# Patient Record
Sex: Female | Born: 1945 | Race: White | Hispanic: No | Smoking: Former smoker
Health system: Southern US, Community
[De-identification: ages and names within clinical notes are randomized; demographics above are authoritative.]

## PROBLEM LIST (undated history)

## (undated) DIAGNOSIS — L409 Psoriasis, unspecified: Secondary | ICD-10-CM

## (undated) DIAGNOSIS — I480 Paroxysmal atrial fibrillation: Secondary | ICD-10-CM

## (undated) DIAGNOSIS — I10 Essential (primary) hypertension: Secondary | ICD-10-CM

## (undated) DIAGNOSIS — M1990 Unspecified osteoarthritis, unspecified site: Secondary | ICD-10-CM

## (undated) DIAGNOSIS — D510 Vitamin B12 deficiency anemia due to intrinsic factor deficiency: Secondary | ICD-10-CM

## (undated) DIAGNOSIS — E785 Hyperlipidemia, unspecified: Secondary | ICD-10-CM

## (undated) DIAGNOSIS — J45909 Unspecified asthma, uncomplicated: Secondary | ICD-10-CM

## (undated) DIAGNOSIS — M7501 Adhesive capsulitis of right shoulder: Secondary | ICD-10-CM

## (undated) DIAGNOSIS — D509 Iron deficiency anemia, unspecified: Secondary | ICD-10-CM

## (undated) HISTORY — DX: Unspecified osteoarthritis, unspecified site: M19.90

## (undated) HISTORY — PX: CHOLECYSTECTOMY: SHX55

## (undated) HISTORY — DX: Iron deficiency anemia, unspecified: D50.9

## (undated) HISTORY — DX: Vitamin B12 deficiency anemia due to intrinsic factor deficiency: D51.0

## (undated) HISTORY — DX: Adhesive capsulitis of right shoulder: M75.01

## (undated) HISTORY — DX: Paroxysmal atrial fibrillation: I48.0

## (undated) HISTORY — DX: Unspecified asthma, uncomplicated: J45.909

## (undated) HISTORY — DX: Essential (primary) hypertension: I10

## (undated) HISTORY — DX: Psoriasis, unspecified: L40.9

## (undated) HISTORY — DX: Hyperlipidemia, unspecified: E78.5

## (undated) HISTORY — PX: HIP ARTHROPLASTY: SHX981

## (undated) HISTORY — PX: KNEE ARTHROPLASTY: SHX992

---

## 2013-07-09 DIAGNOSIS — D509 Iron deficiency anemia, unspecified: Secondary | ICD-10-CM | POA: Insufficient documentation

## 2014-03-02 DIAGNOSIS — S7002XA Contusion of left hip, initial encounter: Secondary | ICD-10-CM | POA: Insufficient documentation

## 2014-09-23 DIAGNOSIS — D72819 Decreased white blood cell count, unspecified: Secondary | ICD-10-CM

## 2014-09-23 DIAGNOSIS — L409 Psoriasis, unspecified: Secondary | ICD-10-CM | POA: Insufficient documentation

## 2014-09-23 DIAGNOSIS — M169 Osteoarthritis of hip, unspecified: Secondary | ICD-10-CM | POA: Insufficient documentation

## 2014-09-23 DIAGNOSIS — J45909 Unspecified asthma, uncomplicated: Secondary | ICD-10-CM | POA: Insufficient documentation

## 2014-09-23 DIAGNOSIS — I10 Essential (primary) hypertension: Secondary | ICD-10-CM | POA: Insufficient documentation

## 2014-09-23 DIAGNOSIS — K219 Gastro-esophageal reflux disease without esophagitis: Secondary | ICD-10-CM | POA: Insufficient documentation

## 2014-09-23 DIAGNOSIS — D510 Vitamin B12 deficiency anemia due to intrinsic factor deficiency: Secondary | ICD-10-CM | POA: Insufficient documentation

## 2014-09-23 HISTORY — DX: Decreased white blood cell count, unspecified: D72.819

## 2014-09-23 HISTORY — DX: Osteoarthritis of hip, unspecified: M16.9

## 2016-12-18 DIAGNOSIS — R011 Cardiac murmur, unspecified: Secondary | ICD-10-CM | POA: Insufficient documentation

## 2019-01-08 DIAGNOSIS — I480 Paroxysmal atrial fibrillation: Secondary | ICD-10-CM | POA: Insufficient documentation

## 2019-07-28 DIAGNOSIS — Z96641 Presence of right artificial hip joint: Secondary | ICD-10-CM | POA: Insufficient documentation

## 2019-10-02 DIAGNOSIS — Z7901 Long term (current) use of anticoagulants: Secondary | ICD-10-CM | POA: Insufficient documentation

## 2020-07-05 DIAGNOSIS — E782 Mixed hyperlipidemia: Secondary | ICD-10-CM | POA: Insufficient documentation

## 2020-10-26 DIAGNOSIS — M7501 Adhesive capsulitis of right shoulder: Secondary | ICD-10-CM | POA: Insufficient documentation

## 2021-05-25 ENCOUNTER — Other Ambulatory Visit: Payer: Self-pay

## 2021-05-25 ENCOUNTER — Encounter: Payer: Self-pay | Admitting: Primary Care

## 2021-05-25 ENCOUNTER — Ambulatory Visit (INDEPENDENT_AMBULATORY_CARE_PROVIDER_SITE_OTHER): Payer: Medicare Other | Admitting: Primary Care

## 2021-05-25 VITALS — BP 124/80 | HR 55 | Temp 98.3°F | Ht 63.0 in | Wt 205.0 lb

## 2021-05-25 DIAGNOSIS — J4520 Mild intermittent asthma, uncomplicated: Secondary | ICD-10-CM

## 2021-05-25 DIAGNOSIS — R739 Hyperglycemia, unspecified: Secondary | ICD-10-CM

## 2021-05-25 DIAGNOSIS — I480 Paroxysmal atrial fibrillation: Secondary | ICD-10-CM | POA: Diagnosis not present

## 2021-05-25 DIAGNOSIS — Z23 Encounter for immunization: Secondary | ICD-10-CM | POA: Diagnosis not present

## 2021-05-25 DIAGNOSIS — L409 Psoriasis, unspecified: Secondary | ICD-10-CM

## 2021-05-25 DIAGNOSIS — I10 Essential (primary) hypertension: Secondary | ICD-10-CM

## 2021-05-25 DIAGNOSIS — D510 Vitamin B12 deficiency anemia due to intrinsic factor deficiency: Secondary | ICD-10-CM

## 2021-05-25 DIAGNOSIS — M7501 Adhesive capsulitis of right shoulder: Secondary | ICD-10-CM

## 2021-05-25 DIAGNOSIS — E782 Mixed hyperlipidemia: Secondary | ICD-10-CM | POA: Diagnosis not present

## 2021-05-25 DIAGNOSIS — D509 Iron deficiency anemia, unspecified: Secondary | ICD-10-CM

## 2021-05-25 DIAGNOSIS — K219 Gastro-esophageal reflux disease without esophagitis: Secondary | ICD-10-CM

## 2021-05-25 DIAGNOSIS — Z7901 Long term (current) use of anticoagulants: Secondary | ICD-10-CM

## 2021-05-25 LAB — CBC
HCT: 38.9 % (ref 36.0–46.0)
Hemoglobin: 12.7 g/dL (ref 12.0–15.0)
MCHC: 32.7 g/dL (ref 30.0–36.0)
MCV: 97.1 fl (ref 78.0–100.0)
Platelets: 160 10*3/uL (ref 150.0–400.0)
RBC: 4.01 Mil/uL (ref 3.87–5.11)
RDW: 13.2 % (ref 11.5–15.5)
WBC: 4.3 10*3/uL (ref 4.0–10.5)

## 2021-05-25 LAB — LIPID PANEL
Cholesterol: 154 mg/dL (ref 0–200)
HDL: 60.6 mg/dL (ref >39.00)
LDL Cholesterol: 66 mg/dL (ref 0–99)
NonHDL: 92.93
Total CHOL/HDL Ratio: 3
Triglycerides: 133 mg/dL (ref 0.0–149.0)
VLDL: 26.6 mg/dL (ref 0.0–40.0)

## 2021-05-25 LAB — COMPREHENSIVE METABOLIC PANEL
ALT: 20 U/L (ref 0–35)
AST: 21 U/L (ref 0–37)
Albumin: 4.3 g/dL (ref 3.5–5.2)
Alkaline Phosphatase: 104 U/L (ref 39–117)
BUN: 38 mg/dL — ABNORMAL HIGH (ref 6–23)
CO2: 23 mEq/L (ref 19–32)
Calcium: 9.6 mg/dL (ref 8.4–10.5)
Chloride: 110 mEq/L (ref 96–112)
Creatinine, Ser: 1.71 mg/dL — ABNORMAL HIGH (ref 0.40–1.20)
GFR: 29.31 mL/min — ABNORMAL LOW (ref >60.00)
Glucose, Bld: 89 mg/dL (ref 70–99)
Potassium: 5.3 mEq/L — ABNORMAL HIGH (ref 3.5–5.1)
Sodium: 142 mEq/L (ref 135–145)
Total Bilirubin: 0.8 mg/dL (ref 0.2–1.2)
Total Protein: 6.7 g/dL (ref 6.0–8.3)

## 2021-05-25 LAB — IBC + FERRITIN
Ferritin: 34.5 ng/mL (ref 10.0–291.0)
Iron: 101 ug/dL (ref 42–145)
Saturation Ratios: 27.7 % (ref 20.0–50.0)
TIBC: 364 ug/dL (ref 250.0–450.0)
Transferrin: 260 mg/dL (ref 212.0–360.0)

## 2021-05-25 LAB — HEMOGLOBIN A1C: Hgb A1c MFr Bld: 5 % (ref 4.6–6.5)

## 2021-05-25 LAB — VITAMIN B12: Vitamin B-12: >1550 pg/mL — ABNORMAL HIGH (ref 211–911)

## 2021-05-25 MED ORDER — PREDNISONE 20 MG PO TABS: ORAL_TABLET | ORAL | 0 refills | Status: DC

## 2021-05-25 NOTE — Assessment & Plan Note (Signed)
Compliant to Xarelto 20 mg. Continue same.

## 2021-05-25 NOTE — Assessment & Plan Note (Signed)
Obvious today on exam. Rx for prednisone course sent to pharmacy. She will set up with our sports medicine physician.

## 2021-05-25 NOTE — Patient Instructions (Signed)
Stop by the lab prior to leaving today. I will notify you of your results once received.   You will be contacted regarding your referral to cardiology and dermatology.  Please let us know if you have not been contacted within two weeks.   Start prednisone tablets for your shoulder pain. Take two tablets my mouth once daily for four days, then one tablet once daily for four days.   Schedule a visit with Dr. Lorelei Pont for your shoulders.   It was a pleasure to meet you today! Please don't hesitate to contact me with any questions. Welcome to [facility]!

## 2021-05-25 NOTE — Assessment & Plan Note (Signed)
Off of oral iron x 6 months. Repeat iron studies and CBC pending.

## 2021-05-25 NOTE — Progress Notes (Signed)
Subjective:    Patient ID: Amy Underwood, female    DOB: 03-25-1946, 75 y.o.   MRN: [id number]  HPI  Amy Underwood is a very pleasant 75 y.o. female who presents today who presents today to establish care and discuss the problems mentioned below. Will obtain/review records.  1) Essential Hypertension/Paroxysmal Atrial Fibrillation: Currently managed on diltiazem CD 240 mg, Tikosyn 125 mcg, furosemide 20 mg, losartan 100 mg, Xarelto 20 mg, spironolactone 50 mg.   BP Readings from Last 3 Encounters:  05/25/21 124/80    Following with cardiology in [place], needs someone locally. She denies chest pain and palpitations. She can feel when she is in atrial fibrillation, no recent episodes. Feels well managed on her current regimen.   2) Psoriasis: Currently managed on Enbrel injections, Dovonox 0.005% cream, clobetasol 0.05% cream. Following with dermatology in [place]. Needs to find someone locally.   3) GERD: Currently managed on pantoprazole 40 mg daily, feels well managed on this regimen. No breakthrough symptoms unless she encounters a food trigger.   4) Asthma: Diagnosed as a child. Managed on albuterol inhaler PRN, uses infrequently.   5) Iron Deficiency Anemia/Pernicious: Chronic for 10 years, history of iron infusions, has been on oral iron for years. Stopped taking this about 6 months ago. She denies fatigue, palpitations.   She is compliant to vitamin B12 1000 mcg daily.   6) Chronic Shoulder Pain: Chronic, worse on right side compared to left. Evaluated by orthopedics in [place], underwent steroid injections in January 2022 or February 2022 with resolve in symptoms until about three weeks ago. Right shoulder pain with radiation down right upper extremity and numbness.     Review of Systems  Constitutional:  Negative for unexpected weight change.  HENT:  Negative for congestion.   Respiratory:  Negative for shortness of breath and wheezing.   Cardiovascular:  Negative  for chest pain and palpitations.  Gastrointestinal:  Negative for nausea.       Denies GERD symptoms  Musculoskeletal:  Positive for arthralgias.  Skin:  Negative for color change.  Allergic/Immunologic: Negative for environmental allergies.  Neurological:  Positive for numbness.  Hematological:  Negative for adenopathy.        Past Medical History:  Diagnosis Date   Adhesive capsulitis of right shoulder    Asthma    Hyperlipidemia    Hypertension    Iron deficiency anemia    Osteoarthritis    Paroxysmal atrial fibrillation (HCC)    Pernicious anemia    Psoriasis     Social History   Socioeconomic History   Marital status: Widowed    Spouse name: Not on file   Number of children: Not on file   Years of education: Not on file   Highest education level: Not on file  Occupational History   Not on file  Tobacco Use   Smoking status: Not on file   Smokeless tobacco: Not on file  Vaping Use   Vaping Use: Never used  Substance and Sexual Activity   Alcohol use: Not on file   Drug use: Never   Sexual activity: Not Currently  Other Topics Concern   Not on file  Social History Narrative   Not on file   Social Determinants of Health   Financial Resource Strain: Not on file  Food Insecurity: Not on file  Transportation Needs: Not on file  Physical Activity: Not on file  Stress: Not on file  Social Connections: Not on file  Intimate Partner Violence: Not  on file    History reviewed. No pertinent surgical history.  History reviewed. No pertinent family history.  Allergies  Allergen Reactions   Cefaclor Hives and Rash    Current Outpatient Medications on File Prior to Visit  Medication Sig Dispense Refill   albuterol (VENTOLIN HFA) 108 (90 Base) MCG/ACT inhaler ProAir HFA 90 mcg/actuation aerosol inhaler     atorvastatin (LIPITOR) 20 MG tablet atorvastatin 20 mg tablet  TAKE 1 TABLET BY MOUTH  DAILY     calcipotriene (DOVONOX) 0.005 % cream calcipotriene  0.005 % topical cream     Cholecalciferol 1.25 MG (50000 UT) capsule cholecalciferol (vitamin D3) 1,250 mcg (50,000 unit) capsule  Take 1 capsule every week by oral route.     clobetasol cream (TEMOVATE) 0.05 % clobetasol 0.05 % topical cream  APPLY TO AFFECTED AREA OF BODY TWICE A  DAY FOR 2 WEEKS OUT OF THE MONTH AS NEEDED FOR FLARES EXCLUDE FACE AND GENITAL     cyanocobalamin 1000 MCG tablet Vitamin B-12  1,000 mcg tablet  Take 1 tablet every day by oral route.     diltiazem (CARDIZEM CD) 240 MG 24 hr capsule Take 240 mg by mouth daily.     dofetilide (TIKOSYN) 125 MCG capsule dofetilide 125 mcg capsule     etanercept (ENBREL SURECLICK) 50 MG/ML injection Enbrel SureClick 50 mg/mL (1 mL) subcutaneous pen injector  Inject 2 mL every week by subcutaneous route.     ferrous sulfate 325 (65 FE) MG tablet ferrous sulfate 325 mg (65 mg iron) tablet  Take 1 tablet every day by oral route.     furosemide (LASIX) 20 MG tablet Take by mouth.     losartan (COZAAR) 100 MG tablet losartan 100 mg tablet     magnesium oxide (MAG-OX) 400 MG tablet magnesium 400 mg (as magnesium oxide) tablet  Take 1 tablet twice a day by oral route.     Multiple Vitamin (MULTI-VITAMIN) tablet Take 1 tablet by mouth every morning.     niacin 500 MG CR capsule niacin  500 mg daily     pantoprazole (PROTONIX) 40 MG tablet pantoprazole 40 mg tablet,delayed release     rivaroxaban (XARELTO) 20 MG TABS tablet Xarelto 20 mg tablet     spironolactone (ALDACTONE) 50 MG tablet Take 50 mg by mouth daily.     No current facility-administered medications on file prior to visit.    BP 124/80 (BP Location: Left Arm, Patient Position: Sitting, Cuff Size: Normal)   Pulse (!) 55   Temp 98.3 F (36.8 C) (Temporal)   Ht '5\' 3"'$  (1.6 m)   Wt 205 lb (93 kg)   SpO2 98%   BMI 36.31 kg/m  Objective:   Physical Exam HENT:     Head: Normocephalic.  Cardiovascular:     Rate and Rhythm: Normal rate and regular rhythm.  Pulmonary:      Effort: Pulmonary effort is normal.     Breath sounds: Normal breath sounds.  Musculoskeletal:     Right shoulder: Decreased range of motion.     Left shoulder: Decreased range of motion.     Cervical back: Neck supple.     Comments: Decrease in ROM to bilateral shoulders, right worse than left.   Skin:    General: Skin is warm and dry.  Neurological:     Mental Status: She is alert and oriented to person, place, and time.  Psychiatric:        Mood and Affect: Mood normal.  Assessment & Plan:      This visit occurred during the SARS-CoV-2 public health emergency.  Safety protocols were in place, including screening questions prior to the visit, additional usage of staff PPE, and extensive cleaning of exam room while observing appropriate contact time as indicated for disinfecting solutions.

## 2021-05-25 NOTE — Assessment & Plan Note (Signed)
Well controlled in the office today.  Continue losartan 100 mg, spironolactone 50 mg, furosemide 20 mg.  CMP pending.

## 2021-05-25 NOTE — Assessment & Plan Note (Signed)
Compliant to atorvastatin 20 mg. Continue same.  Repeat lipid panel pending.

## 2021-05-25 NOTE — Assessment & Plan Note (Signed)
Compliant to oral B12 1000 mcg. Repeat B12 level, iron studies and CBC pending.

## 2021-05-25 NOTE — Assessment & Plan Note (Signed)
Controlled, continue pantoprazole 40 mg.

## 2021-05-25 NOTE — Assessment & Plan Note (Signed)
Controlled on Enbrel injections, clobetasol 0.05%, and Dovonox 0.005% cream.  Referral placed to dermatology

## 2021-05-25 NOTE — Assessment & Plan Note (Signed)
Well controlled in the office today. Continue PRN albuterol.

## 2021-05-25 NOTE — Assessment & Plan Note (Signed)
Rate and rhythm regular today.  Continue Tikosyn 125 mcg, diltiazem CD 240 mg, furosemide 20 mg, losartan 100 mg, spironolactone 50 mg, Xarelto 20 mg.  Referral placed to cardiology.

## 2021-05-26 NOTE — Telephone Encounter (Signed)
Response from results notes

## 2021-05-26 NOTE — Telephone Encounter (Signed)
See my message to her.  Needs lab appointment 1 week after she stops her spironolactone medicine.  [place].

## 2021-05-30 NOTE — Telephone Encounter (Signed)
Left message to return call to our office.  

## 2021-05-30 NOTE — Telephone Encounter (Signed)
Patient called office appointment has been made. No further action at this time.

## 2021-05-31 ENCOUNTER — Telehealth: Payer: Self-pay | Admitting: Primary Care

## 2021-05-31 DIAGNOSIS — I10 Essential (primary) hypertension: Secondary | ICD-10-CM

## 2021-05-31 NOTE — Telephone Encounter (Signed)
Pt has a lab appt 9/29, there is no orders in yet.

## 2021-05-31 NOTE — Addendum Note (Signed)
Addended by: Pleas Koch on: 05/31/2021 01:28 PM   Modules accepted: Orders

## 2021-05-31 NOTE — Telephone Encounter (Signed)
Lab orders placed.  

## 2021-06-02 ENCOUNTER — Other Ambulatory Visit (INDEPENDENT_AMBULATORY_CARE_PROVIDER_SITE_OTHER): Payer: Medicare Other

## 2021-06-02 ENCOUNTER — Other Ambulatory Visit: Payer: Self-pay

## 2021-06-02 DIAGNOSIS — I10 Essential (primary) hypertension: Secondary | ICD-10-CM

## 2021-06-03 DIAGNOSIS — N184 Chronic kidney disease, stage 4 (severe): Secondary | ICD-10-CM

## 2021-06-03 LAB — BASIC METABOLIC PANEL
BUN: 45 mg/dL — ABNORMAL HIGH (ref 6–23)
CO2: 22 mEq/L (ref 19–32)
Calcium: 9.7 mg/dL (ref 8.4–10.5)
Chloride: 105 mEq/L (ref 96–112)
Creatinine, Ser: 1.84 mg/dL — ABNORMAL HIGH (ref 0.40–1.20)
GFR: 26.84 mL/min — ABNORMAL LOW (ref >60.00)
Glucose, Bld: 191 mg/dL — ABNORMAL HIGH (ref 70–99)
Potassium: 5.2 mEq/L — ABNORMAL HIGH (ref 3.5–5.1)
Sodium: 139 mEq/L (ref 135–145)

## 2021-06-16 ENCOUNTER — Other Ambulatory Visit: Payer: Self-pay

## 2021-06-16 ENCOUNTER — Ambulatory Visit
Admission: RE | Admit: 2021-06-16 | Discharge: 2021-06-16 | Disposition: A | Discharge status: Home / Self Care | Payer: Medicare Other | Source: Ambulatory Visit | Attending: Primary Care | Admitting: Primary Care

## 2021-06-16 DIAGNOSIS — N184 Chronic kidney disease, stage 4 (severe): Secondary | ICD-10-CM | POA: Insufficient documentation

## 2021-07-05 NOTE — Progress Notes (Signed)
Electrophysiology Office Note:    Date:  07/06/2021   ID:  Amy Underwood, DOB 03-28-1946, MRN [id number]  PCP:  Pleas Koch, NP  [facility] Cardiologist:  None  [facility] Electrophysiologist:  Vickie Epley, MD   Referring MD: Pleas Koch, NP   Chief Complaint: AF  History of Present Illness:    Amy Underwood is a 75 y.o. female who presents for an evaluation of AF at the request of Amy Friendly, NP. Their medical history includes HTN, pAF. She was last seen by Amy Underwood on 05/25/2021. She is maintained on xarelto for stroke prophylaxis. She has been seen previously by EP in [place], [place]). She was prescribed Dofetilide in the past.  Today she tells me she continues to have breakthrough episodes of atrial fibrillation despite treatment with dofetilide.  She continues to take Xarelto for stroke prophylaxis.  She is interested in achieving better rhythm control.  She has recently moved to the area from [place].  Her husband passed away earlier this year and she has moved to be closer to her son.    Past Medical History:  Diagnosis Date   Adhesive capsulitis of right shoulder    Asthma    Hyperlipidemia    Hypertension    Iron deficiency anemia    Osteoarthritis    Paroxysmal atrial fibrillation (HCC)    Pernicious anemia    Psoriasis     History reviewed. No pertinent surgical history.  Current Medications: Current Meds  Medication Sig   albuterol (VENTOLIN HFA) 108 (90 Base) MCG/ACT inhaler ProAir HFA 90 mcg/actuation aerosol inhaler   atorvastatin (LIPITOR) 20 MG tablet atorvastatin 20 mg tablet  TAKE 1 TABLET BY MOUTH  DAILY   calcipotriene (DOVONOX) 0.005 % cream calcipotriene 0.005 % topical cream   Cholecalciferol 1.25 MG (50000 UT) capsule cholecalciferol (vitamin D3) 1,250 mcg (50,000 unit) capsule  Take 1 capsule every week by oral route.   clobetasol cream (TEMOVATE) 0.05 % clobetasol 0.05 % topical cream  APPLY  TO AFFECTED AREA OF BODY TWICE A  DAY FOR 2 WEEKS OUT OF THE MONTH AS NEEDED FOR FLARES EXCLUDE FACE AND GENITAL   cyanocobalamin 1000 MCG tablet Vitamin B-12  1,000 mcg tablet  Take 1 tablet every day by oral route.   diltiazem (CARDIZEM CD) 240 MG 24 hr capsule Take 240 mg by mouth daily.   dofetilide (TIKOSYN) 125 MCG capsule dofetilide 125 mcg capsule   ferrous sulfate 325 (65 FE) MG tablet every 14 (fourteen) days.   furosemide (LASIX) 20 MG tablet Take by mouth as needed.   losartan (COZAAR) 100 MG tablet losartan 100 mg tablet   magnesium oxide (MAG-OX) 400 MG tablet magnesium 400 mg (as magnesium oxide) tablet  Take 1 tablet twice a day by oral route.   Multiple Vitamin (MULTI-VITAMIN) tablet Take 1 tablet by mouth every morning.   niacin 500 MG CR capsule niacin  500 mg daily   pantoprazole (PROTONIX) 40 MG tablet pantoprazole 40 mg tablet,delayed release   rivaroxaban (XARELTO) 20 MG TABS tablet Xarelto 20 mg tablet     Allergies:   Cefaclor   Social History   Socioeconomic History   Marital status: Widowed    Spouse name: Not on file   Number of children: Not on file   Years of education: Not on file   Highest education level: Not on file  Occupational History   Not on file  Tobacco Use   Smoking status: Former  Types: Cigarettes    Start date: 2001   Smokeless tobacco: Never  Vaping Use   Vaping Use: Never used  Substance and Sexual Activity   Alcohol use: Not on file   Drug use: Never   Sexual activity: Not Currently  Other Topics Concern   Not on file  Social History Narrative   Not on file   Social Determinants of Health   Financial Resource Strain: Not on file  Food Insecurity: Not on file  Transportation Needs: Not on file  Physical Activity: Not on file  Stress: Not on file  Social Connections: Not on file     Family History: The patient's family history is not on file.  ROS:   Please see the history of present illness.    All other  systems reviewed and are negative.  EKGs/Labs/Other Studies Reviewed:    The following studies were reviewed today: Prior notes from [facility]  September 06, 2020 echo [facility] report only) CONCLUSIONS:  1. Normal left ventricular cavity size. Normal left ventricular systolic  function. LV Ejection Fraction is approximately: 67 %.  2. Indeterminate diastolic dysfunction based on 2016 ASE guidelines.  3. There are no regional wall motion abnormalities.  4. Mild mitral valve regurgitation.  5. Mild aortic valve stenosis. The peak transaortic gradient is: 26.66 mmHg.  The mean transaortic gradient is: 11.61 mmHg. The aortic valve area by the  continuity equation (using VTI) is: 1.88 cm2.  6. The aortic root is normal in size. The ascending aorta is normal in size.    Blood work from Jan 28, 2021 shows a creatinine of 1.35, potassium of 4.8   EKG:  The ekg ordered today demonstrates sinus rhythm.  QTC is 461 ms.   Recent Labs: 05/25/2021: ALT 20; Hemoglobin 12.7; Platelets 160.0 06/02/2021: BUN 45; Creatinine, Ser 1.84; Potassium 5.2 No hemolysis seen; Sodium 139  Recent Lipid Panel    Component Value Date/Time   CHOL 154 05/25/2021 1047   TRIG 133.0 05/25/2021 1047   HDL 60.60 05/25/2021 1047   CHOLHDL 3 05/25/2021 1047   VLDL 26.6 05/25/2021 1047   LDLCALC 66 05/25/2021 1047    Physical Exam:    VS:  BP (!) 144/74 (BP Location: Left Arm, Patient Position: Sitting, Cuff Size: Large)   Pulse 61   Ht 5\' 3"  (1.6 m)   Wt 198 lb (89.8 kg)   SpO2 98%   BMI 35.07 kg/m     Wt Readings from Last 3 Encounters:  07/06/21 198 lb (89.8 kg)  05/25/21 205 lb (93 kg)     GEN:  Well nourished, well developed in no acute distress HEENT: Normal NECK: No JVD; No carotid bruits LYMPHATICS: No lymphadenopathy CARDIAC: RRR, no murmurs, rubs, gallops RESPIRATORY:  Clear to auscultation without rales, wheezing or rhonchi  ABDOMEN: Soft, non-tender, non-distended MUSCULOSKELETAL:  No  edema; No deformity  SKIN: Warm and dry NEUROLOGIC:  Alert and oriented x 3 PSYCHIATRIC:  Normal affect       ASSESSMENT:    1. Persistent atrial fibrillation ([place])   2. Encounter for long-term (current) use of high-risk medication    PLAN:    In order of problems listed above:  #Persistent atrial fibrillation Continues to have symptomatic atrial fibrillation despite treatment with Tikosyn 125 mcg by mouth twice daily.  Unfortunately we cannot increase the dose of dofetilide any further because of her renal dysfunction.  I discussed additional rhythm control strategies including alternative antiarrhythmic drugs (amiodarone) versus catheter ablation.  I  do think she is an acceptable candidate for catheter ablation.  I discussed the procedure in detail with the patient during today's visit include the risk, recovery and likelihood of success.  She think she would like to proceed with this option but she would like to talk it over with her family first.  If she decides to proceed with catheter ablation, she would need a CT and echocardiogram.  Would plan to continue Tikosyn around the time of the ablation and at least for the 3 months following the ablation procedure.  We will need to monitor her renal function closely given her GFR of 27 on the last check.  If there is any further decline in renal function, will likely need to discontinue the dofetilide.  Continue Xarelto for stroke prophylaxis.  Risk, benefits, and alternatives to EP study and radiofrequency ablation for afib were also discussed in detail today. These risks include but are not limited to stroke, bleeding, vascular damage, tamponade, perforation, damage to the esophagus, lungs, and other structures, pulmonary vein stenosis, worsening renal function, and death. The patient understands these risk and wishes to discuss with family before making a final decision.  Carto, ICE, anesthesia are requested for the procedure.  Will also  obtain CT PV protocol prior to the procedure to exclude LAA thrombus and further evaluate atrial anatomy.  #High risk medication use QTC is 461 ms on today's EKG.  Last GFR was 27.  Okay for dofetilide 125 mcg by mouth twice daily with close monitoring of renal function.    Total time spent with patient today 60 minutes. This includes reviewing records, evaluating the patient and coordinating care.  Medication Adjustments/Labs and Tests Ordered: Current medicines are reviewed at length with the patient today.  Concerns regarding medicines are outlined above.  Orders Placed This Encounter  Procedures   EKG 12-Lead    No orders of the defined types were placed in this encounter.    Signed, Hilton Cork. Quentin Ore, MD, [facility], [facility] 07/06/2021 3:44 PM    Electrophysiology [facility]

## 2021-07-06 ENCOUNTER — Encounter: Payer: Self-pay | Admitting: Cardiology

## 2021-07-06 ENCOUNTER — Ambulatory Visit (INDEPENDENT_AMBULATORY_CARE_PROVIDER_SITE_OTHER): Payer: Medicare Other | Admitting: Cardiology

## 2021-07-06 ENCOUNTER — Other Ambulatory Visit: Payer: Self-pay

## 2021-07-06 VITALS — BP 144/74 | HR 61 | Ht 63.0 in | Wt 198.0 lb

## 2021-07-06 DIAGNOSIS — I4819 Other persistent atrial fibrillation: Secondary | ICD-10-CM | POA: Diagnosis not present

## 2021-07-06 DIAGNOSIS — Z79899 Other long term (current) drug therapy: Secondary | ICD-10-CM

## 2021-07-06 NOTE — Patient Instructions (Addendum)
Medication Instructions:  Your physician recommends that you continue on your current medications as directed. Please refer to the Current Medication list given to you today. *If you need a refill on your cardiac medications before your next appointment, please call your pharmacy*  Lab Work: None ordered. If you have labs (blood work) drawn today and your tests are completely normal, you will receive your results only by: [place] (if you have MyChart) OR A paper copy in the mail If you have any lab test that is abnormal or we need to change your treatment, we will call you to review the results.  Testing/Procedures: None ordered.  Follow-Up: At [facility], you and your health needs are our priority.  As part of our continuing mission to provide you with exceptional heart care, we have created designated Provider Care Teams.  These Care Teams include your primary Cardiologist (physician) and Advanced Practice Providers (APPs -  Physician Assistants and Nurse Practitioners) who all work together to provide you with the care you need, when you need it.  Your physician wants you to follow-up in: 3 months with [facility]. You will receive a reminder letter in the mail two months in advance. If you don't receive a letter, please call our office to schedule the follow-up appointment.    Please contact me if you would like to schedule an atrial fibrillation ablation.  Sonia Baller RN  Cardiac Ablation Cardiac ablation is a procedure to destroy, or ablate, a small amount of heart tissue in very specific places. The heart has many electrical connections. Sometimes these connections are abnormal and can cause the heart to beat very fast or irregularly. Ablating some of the areas that cause problems can improve the heart's rhythm or return it to normal. Ablation may be done for people who: Have Wolff-Parkinson-White syndrome. Have fast heart rhythms (tachycardia). Have taken medicines for  an abnormal heart rhythm (arrhythmia) that were not effective or caused side effects. Have a high-risk heartbeat that may be life-threatening. During the procedure, a small incision is made in the neck or the groin, and a long, thin tube (catheter) is inserted into the incision and moved to the heart. Small devices (electrodes) on the tip of the catheter will send out electrical currents. A type of X-ray (fluoroscopy) will be used to help guide the catheter and to provide images of the heart. Tell a health care provider about: Any allergies you have. All medicines you are taking, including vitamins, herbs, eye drops, creams, and over-the-counter medicines. Any problems you or family members have had with anesthetic medicines. Any blood disorders you have. Any surgeries you have had. Any medical conditions you have, such as kidney failure. Whether you are pregnant or may be pregnant. What are the risks? Generally, this is a safe procedure. However, problems may occur, including: Infection. Bruising and bleeding at the catheter insertion site. Bleeding into the chest, especially into the sac that surrounds the heart. This is a serious complication. Stroke or blood clots. Damage to nearby structures or organs. Allergic reaction to medicines or dyes. Need for a permanent pacemaker if the normal electrical system is damaged. A pacemaker is a small computer that sends electrical signals to the heart and helps your heart beat normally. The procedure not being fully effective. This may not be recognized until months later. Repeat ablation procedures are sometimes done. What happens before the procedure? Medicines Ask your health care provider about: Changing or stopping your regular medicines. This is especially important  if you are taking diabetes medicines or blood thinners. Taking medicines such as aspirin and ibuprofen. These medicines can thin your blood. Do not take these medicines unless your  health care provider tells you to take them. Taking over-the-counter medicines, vitamins, herbs, and supplements. General instructions Follow instructions from your health care provider about eating or drinking restrictions. Plan to have someone take you home from the hospital or clinic. If you will be going home right after the procedure, plan to have someone with you for 24 hours. Ask your health care provider what steps will be taken to prevent infection. What happens during the procedure?  An IV will be inserted into one of your veins. You will be given a medicine to help you relax (sedative). The skin on your neck or groin will be numbed. An incision will be made in your neck or your groin. A needle will be inserted through the incision and into a large vein in your neck or groin. A catheter will be inserted into the needle and moved to your heart. Dye may be injected through the catheter to help your surgeon see the area of the heart that needs treatment. Electrical currents will be sent from the catheter to ablate heart tissue in desired areas. There are three types of energy that may be used to do this: Heat (radiofrequency energy). Laser energy. Extreme cold (cryoablation). When the tissue has been ablated, the catheter will be removed. Pressure will be held on the insertion area to prevent a lot of bleeding. A bandage (dressing) will be placed over the insertion area. The exact procedure may vary among health care providers and hospitals. What happens after the procedure? Your blood pressure, heart rate, breathing rate, and blood oxygen level will be monitored until you leave the hospital or clinic. Your insertion area will be monitored for bleeding. You will need to lie still for a few hours to ensure that you do not bleed from the insertion area. Do not drive for 24 hours or as long as told by your health care provider. Summary Cardiac ablation is a procedure to destroy, or  ablate, a small amount of heart tissue using an electrical current. This procedure can improve the heart rhythm or return it to normal. Tell your health care provider about any medical conditions you may have and all medicines you are taking to treat them. This is a safe procedure, but problems may occur. Problems may include infection, bruising, damage to nearby organs or structures, or allergic reactions to medicines. Follow your health care provider's instructions about eating and drinking before the procedure. You may also be told to change or stop some of your medicines. After the procedure, do not drive for 24 hours or as long as told by your health care provider. This information is not intended to replace advice given to you by your health care provider. Make sure you discuss any questions you have with your health care provider. Document Revised: 06/30/2019 Document Reviewed: 06/30/2019 Elsevier Patient Education  [place].

## 2021-07-11 DIAGNOSIS — D631 Anemia in chronic kidney disease: Secondary | ICD-10-CM | POA: Insufficient documentation

## 2021-07-11 DIAGNOSIS — I129 Hypertensive chronic kidney disease with stage 1 through stage 4 chronic kidney disease, or unspecified chronic kidney disease: Secondary | ICD-10-CM | POA: Insufficient documentation

## 2021-07-11 DIAGNOSIS — N184 Chronic kidney disease, stage 4 (severe): Secondary | ICD-10-CM | POA: Insufficient documentation

## 2021-07-11 DIAGNOSIS — E875 Hyperkalemia: Secondary | ICD-10-CM | POA: Insufficient documentation

## 2021-07-11 HISTORY — DX: Hypertensive chronic kidney disease with stage 1 through stage 4 chronic kidney disease, or unspecified chronic kidney disease: I12.9

## 2021-07-18 DIAGNOSIS — M6530 Trigger finger, unspecified finger: Secondary | ICD-10-CM | POA: Insufficient documentation

## 2021-08-16 ENCOUNTER — Encounter (INDEPENDENT_AMBULATORY_CARE_PROVIDER_SITE_OTHER): Payer: Self-pay | Admitting: Vascular Surgery

## 2021-08-16 ENCOUNTER — Ambulatory Visit (INDEPENDENT_AMBULATORY_CARE_PROVIDER_SITE_OTHER): Payer: Medicare Other | Admitting: Vascular Surgery

## 2021-08-16 ENCOUNTER — Other Ambulatory Visit: Payer: Self-pay

## 2021-08-16 VITALS — BP 179/81 | HR 78 | Resp 16 | Wt 194.2 lb

## 2021-08-16 DIAGNOSIS — N184 Chronic kidney disease, stage 4 (severe): Secondary | ICD-10-CM | POA: Diagnosis not present

## 2021-08-16 DIAGNOSIS — E782 Mixed hyperlipidemia: Secondary | ICD-10-CM | POA: Diagnosis not present

## 2021-08-16 DIAGNOSIS — I10 Essential (primary) hypertension: Secondary | ICD-10-CM | POA: Diagnosis not present

## 2021-08-16 DIAGNOSIS — I83813 Varicose veins of bilateral lower extremities with pain: Secondary | ICD-10-CM | POA: Diagnosis not present

## 2021-08-16 NOTE — Assessment & Plan Note (Signed)
The patient has already tried compression socks and elevation but her varicose veins seem to be getting worse and her venous stasis changes, swelling, and pain are also progressing as well.  She does have significant chronic kidney disease which may be contributing to her lower extremity swelling, but significant venous insufficiency seems likely to be present.  We will get a venous reflux study in the near future at her convenience.  Again, she has tried compression stockings and elevation with no relief.  We will see her back following the study

## 2021-08-16 NOTE — Progress Notes (Signed)
Patient ID: Amy Underwood, female   DOB: August 17, 1946, 75 y.o.   MRN: [id number]  Chief Complaint  Patient presents with   New Patient (Initial Visit)    Ref [facility] consult for varicose veins    HPI Amy Underwood is a 75 y.o. female.  I am asked to see the patient by Dr. Juleen China for evaluation of varicose veins and leg swelling.  The patient has had varicose moods for many years that have gradually worsened over time.  She has noticed more purplish discoloration of her left foot and ankle over the past year or 2.  Swelling is also become more noticeable as had heaviness and tiredness in her legs.  The left leg is a little more severely affected than the right, but the right has symptoms as well.  No open wounds or ulceration.  No fevers or chills.  No antecedent history of DVT or superficial thrombophlebitis to her knowledge..     Past Medical History:  Diagnosis Date   Adhesive capsulitis of right shoulder    Asthma    Hyperlipidemia    Hypertension    Iron deficiency anemia    Osteoarthritis    Paroxysmal atrial fibrillation (HCC)    Pernicious anemia    Psoriasis     Past Surgical History:  Procedure Laterality Date   CESAREAN SECTION     3   CHOLECYSTECTOMY     HIP ARTHROPLASTY Left    KNEE ARTHROPLASTY Bilateral      Family History  Problem Relation Age of Onset   Heart disease Mother   No bleeding disorders, clotting disorders, autoimmune diseases, or aneurysms   Social History   Tobacco Use   Smoking status: Former    Types: Cigarettes    Start date: 2001   Smokeless tobacco: Never  Vaping Use   Vaping Use: Never used  Substance Use Topics   Drug use: Never     Allergies  Allergen Reactions   Cefaclor Hives and Rash    Current Outpatient Medications  Medication Sig Dispense Refill   albuterol (VENTOLIN HFA) 108 (90 Base) MCG/ACT inhaler ProAir HFA 90 mcg/actuation aerosol inhaler     atorvastatin (LIPITOR) 20 MG tablet atorvastatin 20 mg  tablet  TAKE 1 TABLET BY MOUTH  DAILY     calcipotriene (DOVONOX) 0.005 % cream calcipotriene 0.005 % topical cream     Cholecalciferol 1.25 MG (50000 UT) capsule cholecalciferol (vitamin D3) 1,250 mcg (50,000 unit) capsule  Take 1 capsule every week by oral route.     clobetasol cream (TEMOVATE) 0.05 % clobetasol 0.05 % topical cream  APPLY TO AFFECTED AREA OF BODY TWICE A  DAY FOR 2 WEEKS OUT OF THE MONTH AS NEEDED FOR FLARES EXCLUDE FACE AND GENITAL     cyanocobalamin 1000 MCG tablet Vitamin B-12  1,000 mcg tablet  Take 1 tablet every day by oral route.     diltiazem (CARDIZEM CD) 240 MG 24 hr capsule Take 240 mg by mouth daily.     dofetilide (TIKOSYN) 125 MCG capsule dofetilide 125 mcg capsule     ferrous sulfate 325 (65 FE) MG tablet every 14 (fourteen) days.     losartan (COZAAR) 100 MG tablet losartan 100 mg tablet     magnesium oxide (MAG-OX) 400 MG tablet magnesium 400 mg (as magnesium oxide) tablet  Take 1 tablet twice a day by oral route.     Multiple Vitamin (MULTI-VITAMIN) tablet Take 1 tablet by mouth every morning.  niacin 500 MG CR capsule niacin  500 mg daily     pantoprazole (PROTONIX) 40 MG tablet pantoprazole 40 mg tablet,delayed release     rivaroxaban (XARELTO) 20 MG TABS tablet Xarelto 20 mg tablet     etanercept (ENBREL SURECLICK) 50 MG/ML injection Enbrel SureClick 50 mg/mL (1 mL) subcutaneous pen injector  Inject 2 mL every week by subcutaneous route. (Patient not taking: Reported on 07/06/2021)     furosemide (LASIX) 20 MG tablet Take by mouth as needed.     No current facility-administered medications for this visit.      REVIEW OF SYSTEMS (Negative unless checked)  Constitutional: [] Weight loss  [] Fever  [] Chills Cardiac: [] Chest pain   [] Chest pressure   [] Palpitations   [] Shortness of breath when laying flat   [] Shortness of breath at rest   [] Shortness of breath with exertion. Vascular:  [] Pain in legs with walking   [] Pain in legs at rest   [] Pain  in legs when laying flat   [] Claudication   [] Pain in feet when walking  [] Pain in feet at rest  [] Pain in feet when laying flat   [] History of DVT   [] Phlebitis   [x] Swelling in legs   [x] Varicose veins   [] Non-healing ulcers Pulmonary:   [] Uses home oxygen   [] Productive cough   [] Hemoptysis   [] Wheeze  [] COPD   [] Asthma Neurologic:  [] Dizziness  [] Blackouts   [] Seizures   [] History of stroke   [] History of TIA  [] Aphasia   [] Temporary blindness   [] Dysphagia   [] Weakness or numbness in arms   [] Weakness or numbness in legs Musculoskeletal:  [x] Arthritis   [] Joint swelling   [x] Joint pain   [] Low back pain Hematologic:  [] Easy bruising  [] Easy bleeding   [] Hypercoagulable state   [] Anemic  [] Hepatitis Gastrointestinal:  [] Blood in stool   [] Vomiting blood  [] Gastroesophageal reflux/heartburn   [] Abdominal pain Genitourinary:  [] Chronic kidney disease   [] Difficult urination  [] Frequent urination  [] Burning with urination   [] Hematuria Skin:  [] Rashes   [] Ulcers   [] Wounds Psychological:  [] History of anxiety   []  History of major depression.    Physical Exam BP (!) 179/81 (BP Location: Right Arm)    Pulse 78    Resp 16    Wt 194 lb 3.2 oz (88.1 kg)    BMI 34.40 kg/m  Gen:  WD/WN, NAD Head: [place]/AT, No temporalis wasting. Ear/Nose/Throat: Hearing grossly intact, nares w/o erythema or drainage, oropharynx w/o Erythema/Exudate Eyes: Conjunctiva clear, sclera non-icteric  Neck: trachea midline.  No JVD.  Pulmonary:  Good air movement, respirations not labored, no use of accessory muscles  Cardiac: Irregular Vascular:  Vessel Right Left  Radial Palpable Palpable                          DP 2+ 1+  PT 1+ 1+   Gastrointestinal:. No masses, surgical incisions, or scars. Musculoskeletal: M/S 5/5 throughout.  Extremities without ischemic changes.  No deformity or atrophy.  Diffuse varicosities present bilaterally.  1+ bilateral lower extremity edema. Neurologic: Sensation grossly intact in  extremities.  Symmetrical.  Speech is fluent. Motor exam as listed above. Psychiatric: Judgment intact, Mood & affect appropriate for pt's clinical situation. Dermatologic: No rashes or ulcers noted.  No cellulitis or open wounds.    Radiology No results found.  Labs Recent Results (from the past 2160 hour(s))  Lipid panel     Status: None   Collection Time: 05/25/21  10:47 AM  Result Value Ref Range   Cholesterol 154 0 - 200 mg/dL    Comment: ATP III Classification       Desirable:  < 200 mg/dL               Borderline High:  200 - 239 mg/dL          High:  > = 240 mg/dL   Triglycerides 133.0 0.0 - 149.0 mg/dL    Comment: Normal:  <150 mg/dLBorderline High:  150 - 199 mg/dL   HDL 60.60 >39.00 mg/dL   VLDL 26.6 0.0 - 40.0 mg/dL   LDL Cholesterol 66 0 - 99 mg/dL   Total CHOL/HDL Ratio 3     Comment:                Men          Women1/2 Average Risk     3.4          3.3Average Risk          5.0          4.42X Average Risk          9.6          7.13X Average Risk          15.0          11.0                       NonHDL 92.93     Comment: NOTE:  Non-HDL goal should be 30 mg/dL higher than patient's LDL goal (i.e. LDL goal of < 70 mg/dL, would have non-HDL goal of < 100 mg/dL)  Comprehensive metabolic panel     Status: Abnormal   Collection Time: 05/25/21 10:47 AM  Result Value Ref Range   Sodium 142 135 - 145 mEq/L   Potassium 5.3 No hemolysis seen (H) 3.5 - 5.1 mEq/L   Chloride 110 96 - 112 mEq/L   CO2 23 19 - 32 mEq/L   Glucose, Bld 89 70 - 99 mg/dL   BUN 38 (H) 6 - 23 mg/dL   Creatinine, Ser 1.71 (H) 0.40 - 1.20 mg/dL   Total Bilirubin 0.8 0.2 - 1.2 mg/dL   Alkaline Phosphatase 104 39 - 117 U/L   AST 21 0 - 37 U/L   ALT 20 0 - 35 U/L   Total Protein 6.7 6.0 - 8.3 g/dL   Albumin 4.3 3.5 - 5.2 g/dL   GFR 29.31 (L) >60.00 mL/min    Comment: Calculated using the CKD-EPI Creatinine Equation (2021)   Calcium 9.6 8.4 - 10.5 mg/dL  CBC     Status: None   Collection Time: 05/25/21  10:47 AM  Result Value Ref Range   WBC 4.3 4.0 - 10.5 K/uL   RBC 4.01 3.87 - 5.11 Mil/uL   Platelets 160.0 150.0 - 400.0 K/uL   Hemoglobin 12.7 12.0 - 15.0 g/dL   HCT 38.9 36.0 - 46.0 %   MCV 97.1 78.0 - 100.0 fl   MCHC 32.7 30.0 - 36.0 g/dL   RDW 13.2 11.5 - 15.5 %  Hemoglobin A1c     Status: None   Collection Time: 05/25/21 10:47 AM  Result Value Ref Range   Hgb A1c MFr Bld 5.0 4.6 - 6.5 %    Comment: Glycemic Control Guidelines for People with Diabetes:Non Diabetic:  <6%Goal of Therapy: <7%Additional Action Suggested:  >8%   IBC + Ferritin  Status: None   Collection Time: 05/25/21 10:47 AM  Result Value Ref Range   Iron 101 42 - 145 ug/dL   Transferrin 260.0 212.0 - 360.0 mg/dL   Saturation Ratios 27.7 20.0 - 50.0 %   Ferritin 34.5 10.0 - 291.0 ng/mL   TIBC 364.0 250.0 - 450.0 mcg/dL  Vitamin B12     Status: Abnormal   Collection Time: 05/25/21 10:47 AM  Result Value Ref Range   Vitamin B-12 >1550 (H) 211 - 911 pg/mL  Basic metabolic panel     Status: Abnormal   Collection Time: 06/02/21  2:38 PM  Result Value Ref Range   Sodium 139 135 - 145 mEq/L   Potassium 5.2 No hemolysis seen (H) 3.5 - 5.1 mEq/L   Chloride 105 96 - 112 mEq/L   CO2 22 19 - 32 mEq/L   Glucose, Bld 191 (H) 70 - 99 mg/dL   BUN 45 (H) 6 - 23 mg/dL   Creatinine, Ser 1.84 (H) 0.40 - 1.20 mg/dL   GFR 26.84 (L) >60.00 mL/min    Comment: Calculated using the CKD-EPI Creatinine Equation (2021)   Calcium 9.7 8.4 - 10.5 mg/dL    Assessment/Plan:  Varicose veins of leg with pain, bilateral The patient has already tried compression socks and elevation but her varicose veins seem to be getting worse and her venous stasis changes, swelling, and pain are also progressing as well.  She does have significant chronic kidney disease which may be contributing to her lower extremity swelling, but significant venous insufficiency seems likely to be present.  We will get a venous reflux study in the near future at her  convenience.  Again, she has tried compression stockings and elevation with no relief.  We will see her back following the study  Essential hypertension blood pressure control important in reducing the progression of atherosclerotic disease. On appropriate oral medications.   Chronic kidney disease, stage IV (severe) (HCC) Likely worsens lower extremity swelling.      Leotis Pain 08/16/2021, 3:31 PM   This note was created with Dragon medical transcription system.  Any errors from dictation are unintentional.

## 2021-08-16 NOTE — Assessment & Plan Note (Signed)
blood pressure control important in reducing the progression of atherosclerotic disease. On appropriate oral medications.  

## 2021-08-16 NOTE — Assessment & Plan Note (Signed)
Likely worsens lower extremity swelling.

## 2021-08-30 ENCOUNTER — Telehealth: Payer: Self-pay | Admitting: Cardiology

## 2021-08-30 NOTE — Telephone Encounter (Signed)
Patient calling  States she checked around the places that were suggested but patient is unable to get Xarelto 20MG   Would like to discuss other options  Please call to discuss

## 2021-08-30 NOTE — Telephone Encounter (Signed)
Returned call to Pt.  There are no assistance programs in this area to assist with Xarelto costs.  Her cost is $500 a month.  Will discuss with Dr. Quentin Ore to determine if ok to switch to warfarin.

## 2021-09-02 MED ORDER — WARFARIN SODIUM 5 MG PO TABS: 5.0000 mg | ORAL_TABLET | Freq: Every day | ORAL | 11 refills | Status: DC

## 2021-09-02 NOTE — Telephone Encounter (Signed)
Per Dr. Wiliam Ke to transition to warfarin.  So mychart message with medication direction confirmed with pharmacist.  Pt scheduled for new coumadin pt appt January 11 at [facility].

## 2021-09-14 ENCOUNTER — Telehealth: Payer: Self-pay | Admitting: *Deleted

## 2021-09-14 ENCOUNTER — Other Ambulatory Visit: Payer: Self-pay

## 2021-09-14 ENCOUNTER — Ambulatory Visit (INDEPENDENT_AMBULATORY_CARE_PROVIDER_SITE_OTHER): Payer: Medicare Other

## 2021-09-14 DIAGNOSIS — I480 Paroxysmal atrial fibrillation: Secondary | ICD-10-CM

## 2021-09-14 DIAGNOSIS — Z5181 Encounter for therapeutic drug level monitoring: Secondary | ICD-10-CM

## 2021-09-14 DIAGNOSIS — Z7901 Long term (current) use of anticoagulants: Secondary | ICD-10-CM

## 2021-09-14 LAB — POCT INR: INR: 2.5 (ref 2.0–3.0)

## 2021-09-14 NOTE — Telephone Encounter (Signed)
° °  Pre-operative Risk Assessment    Patient Name: Amy Underwood  DOB: September 21, 1945 MRN: [id number]      Request for Surgical Clearance    Procedure:   LEFT MIDDLE FINGER A1 PULLEY RELEASE   Date of Surgery:  Clearance 09/20/21                                 Surgeon:  DR. Peggye Ley Surgeon's Group or Practice Name:  [facility] Phone number:  [phone] Fax number:  [phone] ATTN: CHRISTY [facility]ERETTE   Type of Clearance Requested:   - Medical  - Pharmacy:  Hold Warfarin (Coumadin) x 5 DAYS PRIOR TO SURGERY   Type of Anesthesia:  Local    Additional requests/questions:    Amy Underwood   09/14/2021, 5:06 PM

## 2021-09-14 NOTE — Patient Instructions (Addendum)
-   continue warfarin dosage of 1 tablet (5 mg) every day - Recheck INR in 1 week *CALL YOUR SURGEON'S OFFICE TO LET THEM KNOW YOU ARE ON WARFARIN.  HAVE THEM CONTACT DR. Quentin Ore FOR CARDIAC CLEARANCE IF THEY WANT TO HOLD WARFARIN [facility] [phone] ([place])

## 2021-09-14 NOTE — Progress Notes (Signed)
A full discussion of the nature of anticoagulants has been carried out.  A benefit risk analysis has been presented to the patient, so that they understand the justification for choosing anticoagulation at this time. The need for frequent and regular monitoring, precise dosage adjustment and compliance is stressed.  Side effects of potential bleeding are discussed.  The patient should avoid any OTC items containing aspirin or ibuprofen, and should avoid great swings in general diet.  Avoid alcohol consumption.  Call if any signs of abnormal bleeding.  Next PT/INR test in 1 week 

## 2021-09-15 NOTE — Telephone Encounter (Signed)
Patient with diagnosis of A Fib on warfarin for anticoagulation.    Procedure:  LEFT MIDDLE FINGER A1 PULLEY RELEASE    Date of procedure: 09/20/21  CHA2DS2-VASc Score = 3  This indicates a 3.2% annual risk of stroke. The patient's score is based upon: CHF History: 0 HTN History: 1 Diabetes History: 0 Stroke History: 0 Vascular Disease History: 0 Age Score: 1 Gender Score: 1   CrCl 37 mL/min Platelet count 160K  Per office protocol, patient can hold warfarin for 5 days prior to procedure.

## 2021-09-15 NOTE — Telephone Encounter (Signed)
° °  Primary Cardiologist: None  Chart reviewed as part of pre-operative protocol coverage. Given past medical history and time since last visit, based on ACC/AHA guidelines, Amy Underwood would be at acceptable risk for the planned procedure without further cardiovascular testing. I reached out to her on 09/15/21 and she denies history of CAD. She is able to achieve > 4 METS activity without chest pain, dyspnea, or other concerning symptoms. Her RCRI for MACE is low at Class I 0.4%.  Guidelines from our clinical pharmacist regarding request to hold Warfarin are as follows:  Patient with diagnosis of A Fib on warfarin for anticoagulation.     Procedure:  LEFT MIDDLE FINGER A1 PULLEY RELEASE    Date of procedure: 09/20/21   CHA2DS2-VASc Score = 3  This indicates a 3.2% annual risk of stroke. The patient's score is based upon: CHF History: 0 HTN History: 1 Diabetes History: 0 Stroke History: 0 Vascular Disease History: 0 Age Score: 1 Gender Score: 1   CrCl 37 mL/min Platelet count 160K   Per office protocol, patient can hold warfarin for 5 days prior to procedure.    Patient was advised that if she develops new symptoms prior to surgery to contact our office to arrange a follow-up appointment.  He verbalized understanding.  I will route this recommendation to the requesting party via Epic fax function and remove from pre-op pool.  Please call with questions.  Emmaline Life, NP-C    09/15/2021, 10:44 AM [place] [address], [address] Office [phone] Fax [phone]

## 2021-09-20 ENCOUNTER — Encounter (INDEPENDENT_AMBULATORY_CARE_PROVIDER_SITE_OTHER): Payer: Medicare Other

## 2021-09-20 ENCOUNTER — Ambulatory Visit (INDEPENDENT_AMBULATORY_CARE_PROVIDER_SITE_OTHER): Payer: Medicare Other | Admitting: Vascular Surgery

## 2021-09-23 ENCOUNTER — Telehealth: Payer: Self-pay | Admitting: Primary Care

## 2021-09-23 ENCOUNTER — Other Ambulatory Visit: Payer: Self-pay | Admitting: Primary Care

## 2021-09-23 DIAGNOSIS — J4520 Mild intermittent asthma, uncomplicated: Secondary | ICD-10-CM

## 2021-09-23 MED ORDER — ALBUTEROL SULFATE HFA 108 (90 BASE) MCG/ACT IN AERS: 1.0000 | INHALATION_SPRAY | Freq: Four times a day (QID) | RESPIRATORY_TRACT | 0 refills | Status: DC | PRN

## 2021-09-23 NOTE — Telephone Encounter (Signed)
Pt needs a refill on albuterol (VENTOLIN HFA) 108 (90 Base) MCG/ACT inhaler sen to [facility]

## 2021-09-23 NOTE — Addendum Note (Signed)
Addended by: Pleas Koch on: 09/23/2021 04:29 PM   Modules accepted: Orders

## 2021-09-23 NOTE — Telephone Encounter (Signed)
Noted, refill sent to pharmacy. 

## 2021-09-28 ENCOUNTER — Other Ambulatory Visit: Payer: Self-pay

## 2021-09-28 ENCOUNTER — Ambulatory Visit (INDEPENDENT_AMBULATORY_CARE_PROVIDER_SITE_OTHER): Payer: Medicare Other

## 2021-09-28 DIAGNOSIS — I480 Paroxysmal atrial fibrillation: Secondary | ICD-10-CM

## 2021-09-28 DIAGNOSIS — Z5181 Encounter for therapeutic drug level monitoring: Secondary | ICD-10-CM | POA: Diagnosis not present

## 2021-09-28 DIAGNOSIS — Z7901 Long term (current) use of anticoagulants: Secondary | ICD-10-CM

## 2021-09-28 LAB — POCT INR: INR: 2.4 (ref 2.0–3.0)

## 2021-09-28 NOTE — Patient Instructions (Signed)
-   continue warfarin dosage of 1 tablet (5 mg) every day - Recheck INR in 2 weeks Continue to have your greens on the same day (or days) each week [facility] [phone] ([place])

## 2021-10-11 ENCOUNTER — Ambulatory Visit: Payer: Medicare Other | Admitting: Nurse Practitioner

## 2021-10-12 ENCOUNTER — Other Ambulatory Visit: Payer: Self-pay

## 2021-10-12 ENCOUNTER — Ambulatory Visit (INDEPENDENT_AMBULATORY_CARE_PROVIDER_SITE_OTHER): Payer: Medicare Other

## 2021-10-12 DIAGNOSIS — Z7901 Long term (current) use of anticoagulants: Secondary | ICD-10-CM

## 2021-10-12 DIAGNOSIS — Z5181 Encounter for therapeutic drug level monitoring: Secondary | ICD-10-CM | POA: Diagnosis not present

## 2021-10-12 DIAGNOSIS — I480 Paroxysmal atrial fibrillation: Secondary | ICD-10-CM

## 2021-10-12 LAB — POCT INR: INR: 6.5 — AB (ref 2.0–3.0)

## 2021-10-12 NOTE — Patient Instructions (Signed)
-   skip warfarin today, tomorrow, and Friday - on Saturday, resume your warfarin dosage of 1 tablet (5 mg) every day - Recheck INR in 1 week  Continue to have your greens on the same day (or days) each week [facility] [phone] ([place])

## 2021-10-19 ENCOUNTER — Other Ambulatory Visit: Payer: Self-pay

## 2021-10-19 ENCOUNTER — Ambulatory Visit (INDEPENDENT_AMBULATORY_CARE_PROVIDER_SITE_OTHER): Payer: Medicare Other | Admitting: Nurse Practitioner

## 2021-10-19 ENCOUNTER — Encounter: Payer: Self-pay | Admitting: Nurse Practitioner

## 2021-10-19 ENCOUNTER — Ambulatory Visit (INDEPENDENT_AMBULATORY_CARE_PROVIDER_SITE_OTHER): Payer: Medicare Other

## 2021-10-19 VITALS — BP 130/60 | HR 42 | Ht 63.0 in | Wt 189.0 lb

## 2021-10-19 DIAGNOSIS — I480 Paroxysmal atrial fibrillation: Secondary | ICD-10-CM | POA: Diagnosis not present

## 2021-10-19 DIAGNOSIS — Z7901 Long term (current) use of anticoagulants: Secondary | ICD-10-CM

## 2021-10-19 DIAGNOSIS — E782 Mixed hyperlipidemia: Secondary | ICD-10-CM | POA: Diagnosis not present

## 2021-10-19 DIAGNOSIS — Z5181 Encounter for therapeutic drug level monitoring: Secondary | ICD-10-CM | POA: Diagnosis not present

## 2021-10-19 DIAGNOSIS — I4819 Other persistent atrial fibrillation: Secondary | ICD-10-CM | POA: Diagnosis not present

## 2021-10-19 DIAGNOSIS — I10 Essential (primary) hypertension: Secondary | ICD-10-CM

## 2021-10-19 LAB — POCT INR: INR: 2.7 (ref 2.0–3.0)

## 2021-10-19 NOTE — Patient Instructions (Signed)
-   since you are on prednsione, take 1/2 tablet warfarin today thru Saturday - on Sunday, resume warfarin dosage of 1 tablet (5 mg) every day - Recheck INR in 1 week  Continue to have your greens on the same day (or days) each week [facility] [phone] ([place])

## 2021-10-19 NOTE — Patient Instructions (Addendum)
Medication Instructions:   Your physician has recommended you make the following change in your medication:   STOP taking metoprolol succinate (Toprol-XL). Please keep a log of your blood pressures at home for the next two weeks.   *If you need a refill on your cardiac medications before your next appointment, please call your pharmacy*   Lab Work:  None ordered  If you have labs (blood work) drawn today and your tests are completely normal, you will receive your results only by: [place] (if you have MyChart) OR A paper copy in the mail If you have any lab test that is abnormal or we need to change your treatment, we will call you to review the results.   Testing/Procedures: None ordered   Follow-Up: At [facility], you and your health needs are our priority.  As part of our continuing mission to provide you with exceptional heart care, we have created designated Provider Care Teams.  These Care Teams include your primary Cardiologist (physician) and Advanced Practice Providers (APPs -  Physician Assistants and Nurse Practitioners) who all work together to provide you with the care you need, when you need it.  We recommend signing up for the patient portal called "MyChart".  Sign up information is provided on this After Visit Summary.  MyChart is used to connect with patients for Virtual Visits (Telemedicine).  Patients are able to view lab/test results, encounter notes, upcoming appointments, etc.  Non-urgent messages can be sent to your provider as well.   To learn more about what you can do with MyChart, go to [email or web address].    Your next appointment:   4-6 week(s)  The format for your next appointment:   In Person  Provider:   Lars Mage, MD    Other Instructions  Make sure to check Blood Pressures 2 hours after your medications.   Please keep a log of your readings so we can see how they trend.  Avoid these things for 30 minutes before  checking your blood pressure:  NO Drinking caffeine.  NO Drinking alcohol.  NO Eating.  NO Smoking.  NO Exercising.  Five minutes before checking your blood pressure:  Pee.  Sit in a dining chair. Avoid sitting in a soft couch or armchair.  Be quiet. Do not talk.

## 2021-10-19 NOTE — Progress Notes (Signed)
Cardiology Office Note:    Date:  10/19/2021   ID:  Amy Underwood, DOB May 14, 1946, MRN [id number]  PCP:  Pleas Koch, NP   [facility] Providers Cardiologist:  None Electrophysiologist:  Vickie Epley, MD     Referring MD: Pleas Koch, NP   91-month follow-up for AF Ablation   History of Present Illness:    Amy Underwood is a 76 y.o. female with a hx of asthma, hyperlipidemia, hypertension, persistent A-fib on Coumadin, pernicious anemia.Nuclear stress test was completed 06/2018  that revealed Normal left ventricular wall motion and segmental function without evidence of inducible ischemia. She also underwent DCCV 10/19.She had echo completed with bubble study 09/2020 EF 67%, Indeterminate diastolic dysfunction NRWA, mild mitral valve regurg and mild aortic stenosis. In 11/22 her PCP referred her to Dr. Quentin Ore for evaluation of AF ablation.She was deemed an acceptable candidate for ablation.     She presents today for 33-month follow-up. She is indicated that she would like to discuss A-fib ablation further with Dr. Quentin Ore. She also reports that she is doing okay since her last appointment in November however she has been experiencing some hypertensive BP's with systolics running 734'K and diastolic's in the 876'O. She was started on Farxiga and Metoprolol by her nephrologist in January and reports that she feels very tired and dizzy at time when standing. She is also switched from Xarelto to Coumadin due to financial difficulties and states that she is doing well with this medication.  She has been exercising and walking half a mile to a mile per day around her neighborhood she also reports she has lost 20 pounds since her November appointment.This weight loss has been intentional with diet and lifestyle changes.  Past Medical History:  Diagnosis Date   Adhesive capsulitis of right shoulder    Asthma    Hyperlipidemia    Hypertension    Iron deficiency anemia     Osteoarthritis    Paroxysmal atrial fibrillation (HCC)    Pernicious anemia    Psoriasis     Past Surgical History:  Procedure Laterality Date   CESAREAN SECTION     3   CHOLECYSTECTOMY     HIP ARTHROPLASTY Left    KNEE ARTHROPLASTY Bilateral     Current Medications: Current Meds  Medication Sig   albuterol (VENTOLIN HFA) 108 (90 Base) MCG/ACT inhaler Inhale 1-2 puffs into the lungs every 6 (six) hours as needed for wheezing or shortness of breath.   atorvastatin (LIPITOR) 20 MG tablet atorvastatin 20 mg tablet  TAKE 1 TABLET BY MOUTH  DAILY   calcipotriene (DOVONOX) 0.005 % cream calcipotriene 0.005 % topical cream   Cholecalciferol 1.25 MG (50000 UT) capsule cholecalciferol (vitamin D3) 1,250 mcg (50,000 unit) capsule  Take 1 capsule every week by oral route.   clobetasol cream (TEMOVATE) 0.05 % clobetasol 0.05 % topical cream  APPLY TO AFFECTED AREA OF BODY TWICE A  DAY FOR 2 WEEKS OUT OF THE MONTH AS NEEDED FOR FLARES EXCLUDE FACE AND GENITAL   cyanocobalamin 1000 MCG tablet Vitamin B-12  1,000 mcg tablet  Take 1 tablet every day by oral route.   diltiazem (CARDIZEM CD) 240 MG 24 hr capsule Take 240 mg by mouth daily.   dofetilide (TIKOSYN) 125 MCG capsule dofetilide 125 mcg capsule   etanercept (ENBREL SURECLICK) 50 MG/ML injection    FARXIGA 10 MG TABS tablet Take 10 mg by mouth every morning.   ferrous sulfate 325 (65 FE) MG tablet every  14 (fourteen) days.   furosemide (LASIX) 20 MG tablet Take by mouth as needed.   losartan (COZAAR) 100 MG tablet losartan 100 mg tablet   magnesium oxide (MAG-OX) 400 MG tablet magnesium 400 mg (as magnesium oxide) tablet  Take 1 tablet twice a day by oral route.   Multiple Vitamin (MULTI-VITAMIN) tablet Take 1 tablet by mouth every morning.   niacin 500 MG CR capsule niacin  500 mg daily   pantoprazole (PROTONIX) 40 MG tablet pantoprazole 40 mg tablet,delayed release   warfarin (COUMADIN) 5 MG tablet Take 1 tablet (5 mg total) by  mouth daily.   [DISCONTINUED] metoprolol succinate (TOPROL-XL) 25 MG 24 hr tablet Take 25 mg by mouth daily.     Allergies:   Cefaclor   Social History   Socioeconomic History   Marital status: Widowed    Spouse name: Not on file   Number of children: Not on file   Years of education: Not on file   Highest education level: Not on file  Occupational History   Not on file  Tobacco Use   Smoking status: Former    Types: Cigarettes    Start date: 2001   Smokeless tobacco: Never  Vaping Use   Vaping Use: Never used  Substance and Sexual Activity   Alcohol use: Not on file   Drug use: Never   Sexual activity: Not Currently  Other Topics Concern   Not on file  Social History Narrative   Not on file   Social Determinants of Health   Financial Resource Strain: Not on file  Food Insecurity: Not on file  Transportation Needs: Not on file  Physical Activity: Not on file  Stress: Not on file  Social Connections: Not on file     Family History: The patient's family history includes Heart disease in her mother.  ROS:   Please see the history of present illness.    Review of Systems  Cardiovascular:  Negative for chest pain, palpitations, orthopnea, claudication and PND.  Neurological:  Positive for dizziness.       Some dizziness upon rising  All other systems reviewed and are negative.   EKGs/Labs/Other Studies Reviewed:     EKG:  EKG was ordered today.  The ekg ordered today demonstrates sinus bradycardia 42 bpm   Recent Labs: Labs reviewed from care everywhere  Glucose: 83 BUN: 27 Creatinine: 1.26 GFR: 45 Potassium: 4.3 HGB: 13.0 HCT: 39.0 PLT: 170  Recent Lipid Panel    Component Value Date/Time   CHOL 154 05/25/2021 1047   TRIG 133.0 05/25/2021 1047   HDL 60.60 05/25/2021 1047   CHOLHDL 3 05/25/2021 1047   VLDL 26.6 05/25/2021 1047   LDLCALC 66 05/25/2021 1047     Risk Assessment/Calculations:    CHA2DS2-VASc Score = 3   This indicates a 3.2% annual  risk of stroke. The patient's score is based upon: CHF History: 0 HTN History: 1 Diabetes History: 0 Stroke History: 0 Vascular Disease History: 0 Age Score: 1 Gender Score: 1          Physical Exam:    VS:  BP 130/60 (BP Location: Left Arm, Patient Position: Sitting, Cuff Size: Normal)    Pulse (!) 42    Ht 5\' 3"  (1.6 m)    Wt 189 lb (85.7 kg)    SpO2 97%    BMI 33.48 kg/m     Wt Readings from Last 3 Encounters:  10/19/21 189 lb (85.7 kg)  08/16/21 194 lb 3.2  oz (88.1 kg)  07/06/21 198 lb (89.8 kg)     GEN:  Well nourished, well developed in no acute distress HEENT: Normal NECK: No JVD; No carotid bruits LYMPHATICS: No lymphadenopathy CARDIAC: RRR,SB no murmurs, rubs, gallops RESPIRATORY:  Clear to auscultation without rales, wheezing or rhonchi  ABDOMEN: Soft, non-tender, non-distended MUSCULOSKELETAL:  No edema; No deformity  SKIN: Warm and dry NEUROLOGIC:  Alert and oriented x 3 PSYCHIATRIC:  Normal affect   ASSESSMENT:    1. Persistent atrial fibrillation ([place])   2. Essential hypertension   3. Mixed hyperlipidemia    PLAN:    In order of problems listed above:  1.  Persistent A-fib:   Patient indicated that she is interested in further discussion of AF Ablation we will arrange appointment Continue Cardizem CD2 140 mg and Tikosyn 125 mcg QT/QTc: 447 ms GFR: 45  2. Hypertension:  Stop metoprolol 25 mg Continue Cozaar 100 mg Continue Lasix 20 mg as needed Continue low-sodium diet  3. Mixed Hyperlipidemia:  Continue atorvastatin 20 mg  Continue current exercise routine of walking 1 mile per day            4.  Disposition: Please follow-up with Dr. Quentin Ore regarding A-fib ablation           Medication Adjustments/Labs and Tests Ordered: Current medicines are reviewed at length with the patient today.  Concerns regarding medicines are outlined above.  Orders Placed This Encounter  Procedures   EKG 12-Lead   No orders of the defined types  were placed in this encounter.   Patient Instructions  Medication Instructions:   Your physician has recommended you make the following change in your medication:   STOP taking metoprolol succinate (Toprol-XL). Please keep a log of your blood pressures at home for the next two weeks.   *If you need a refill on your cardiac medications before your next appointment, please call your pharmacy*   Lab Work:  None ordered  If you have labs (blood work) drawn today and your tests are completely normal, you will receive your results only by: [place] (if you have MyChart) OR A paper copy in the mail If you have any lab test that is abnormal or we need to change your treatment, we will call you to review the results.   Testing/Procedures: None ordered   Follow-Up: At [facility], you and your health needs are our priority.  As part of our continuing mission to provide you with exceptional heart care, we have created designated Provider Care Teams.  These Care Teams include your primary Cardiologist (physician) and Advanced Practice Providers (APPs -  Physician Assistants and Nurse Practitioners) who all work together to provide you with the care you need, when you need it.  We recommend signing up for the patient portal called "MyChart".  Sign up information is provided on this After Visit Summary.  MyChart is used to connect with patients for Virtual Visits (Telemedicine).  Patients are able to view lab/test results, encounter notes, upcoming appointments, etc.  Non-urgent messages can be sent to your provider as well.   To learn more about what you can do with MyChart, go to [email or web address].    Your next appointment:   4-6 week(s)  The format for your next appointment:   In Person  Provider:   Lars Mage, MD    Other Instructions  Make sure to check Blood Pressures 2 hours after your medications.   Please keep a log of your readings  so we can see how  they trend.  Avoid these things for 30 minutes before checking your blood pressure:  NO Drinking caffeine.  NO Drinking alcohol.  NO Eating.  NO Smoking.  NO Exercising.  Five minutes before checking your blood pressure:  Pee.  Sit in a dining chair. Avoid sitting in a soft couch or armchair.  Be quiet. Do not talk.    Signed, Mable Fill, Marissa Nestle, NP  10/19/2021 4:58 PM    [facility]

## 2021-10-20 ENCOUNTER — Telehealth: Payer: Self-pay | Admitting: *Deleted

## 2021-10-20 DIAGNOSIS — I480 Paroxysmal atrial fibrillation: Secondary | ICD-10-CM

## 2021-10-20 DIAGNOSIS — Z01818 Encounter for other preprocedural examination: Secondary | ICD-10-CM

## 2021-10-20 DIAGNOSIS — I4891 Unspecified atrial fibrillation: Secondary | ICD-10-CM

## 2021-10-20 NOTE — Telephone Encounter (Signed)
-----   Message from Theora Gianotti, NP sent at 10/20/2021  7:30 AM EST ----- Regarding: pending Quentin Ore f/u for ablation Uvaldo Bristle,  This is the lady that Ambrose Pancoast saw in clinic on 2/15.  Jaquelyn Bitter discharged her from clinic before I had a chance to go over things with him.  She has a h/o AFib on tikosyn and after thinking about it for 3 mos, she now wishes to proceed w/ AF ablation.  She sees Quentin Ore back in March.  He mentioned in his note in November that if she ends up wanting to proceed w/ ablation, that she'd need an echo and a CT.  Would you pls get her set up for the echo.  I'm not familiar w/ the exact CT that they order, so would you pls reach out to Otila Kluver, [place] nurse, to see what Ms. Brecheisen needs?  Thanks,  Gerald Stabs

## 2021-10-25 ENCOUNTER — Ambulatory Visit (INDEPENDENT_AMBULATORY_CARE_PROVIDER_SITE_OTHER): Payer: Medicare Other

## 2021-10-25 ENCOUNTER — Encounter (INDEPENDENT_AMBULATORY_CARE_PROVIDER_SITE_OTHER): Payer: Self-pay | Admitting: Vascular Surgery

## 2021-10-25 ENCOUNTER — Other Ambulatory Visit: Payer: Self-pay | Admitting: Primary Care

## 2021-10-25 ENCOUNTER — Ambulatory Visit (INDEPENDENT_AMBULATORY_CARE_PROVIDER_SITE_OTHER): Payer: Medicare Other | Admitting: Vascular Surgery

## 2021-10-25 ENCOUNTER — Other Ambulatory Visit: Payer: Self-pay

## 2021-10-25 VITALS — BP 179/100 | HR 76 | Resp 16 | Wt 189.2 lb

## 2021-10-25 DIAGNOSIS — I480 Paroxysmal atrial fibrillation: Secondary | ICD-10-CM

## 2021-10-25 DIAGNOSIS — I83813 Varicose veins of bilateral lower extremities with pain: Secondary | ICD-10-CM | POA: Diagnosis not present

## 2021-10-25 DIAGNOSIS — J4520 Mild intermittent asthma, uncomplicated: Secondary | ICD-10-CM

## 2021-10-25 DIAGNOSIS — N184 Chronic kidney disease, stage 4 (severe): Secondary | ICD-10-CM

## 2021-10-25 DIAGNOSIS — I10 Essential (primary) hypertension: Secondary | ICD-10-CM | POA: Diagnosis not present

## 2021-10-25 NOTE — Assessment & Plan Note (Signed)
Poor cardiac function can worsen LE swelling and discolortion

## 2021-10-25 NOTE — Assessment & Plan Note (Signed)
Duplex today shows no deep venous thrombosis or superficial thrombophlebitis in either lower extremity.  No venous reflux was seen on the right.  A short segment venous reflux was seen in the left great saphenous vein isolated in the mid thigh.  This is likely not clinically significant.  No role for intervention and she has improved significantly with medical and conservative therapy.  Return to clinic as needed.

## 2021-10-25 NOTE — Progress Notes (Signed)
MRN : [id number]  Amy Underwood is a 76 y.o. (1945-10-25) female who presents with chief complaint of  Chief Complaint  Patient presents with   Follow-up    Ultrasound follow up  .  History of Present Illness: Patient returns today in follow up of her leg swelling.  This has gotten significantly better.  She says she was put on a medicine for her kidneys which has helped significantly.  This is apparently helped her kidney function as well as her noticing a marked improvement in the swelling.  She has been wearing compression and elevating her legs as well. Duplex today shows no deep venous thrombosis or superficial thrombophlebitis in either lower extremity.  No venous reflux was seen on the right.  A short segment venous reflux was seen in the left great saphenous vein isolated in the mid thigh.  This is likely not clinically significant.  Current Outpatient Medications  Medication Sig Dispense Refill   albuterol (VENTOLIN HFA) 108 (90 Base) MCG/ACT inhaler Inhale 1-2 puffs into the lungs every 6 (six) hours as needed for wheezing or shortness of breath. 6.7 g 0   atorvastatin (LIPITOR) 20 MG tablet atorvastatin 20 mg tablet  TAKE 1 TABLET BY MOUTH  DAILY     calcipotriene (DOVONOX) 0.005 % cream calcipotriene 0.005 % topical cream     Cholecalciferol 1.25 MG (50000 UT) capsule cholecalciferol (vitamin D3) 1,250 mcg (50,000 unit) capsule  Take 1 capsule every week by oral route.     clobetasol cream (TEMOVATE) 0.05 % clobetasol 0.05 % topical cream  APPLY TO AFFECTED AREA OF BODY TWICE A  DAY FOR 2 WEEKS OUT OF THE MONTH AS NEEDED FOR FLARES EXCLUDE FACE AND GENITAL     cyanocobalamin 1000 MCG tablet Vitamin B-12  1,000 mcg tablet  Take 1 tablet every day by oral route.     diltiazem (CARDIZEM CD) 240 MG 24 hr capsule Take 240 mg by mouth daily.     dofetilide (TIKOSYN) 125 MCG capsule dofetilide 125 mcg capsule     etanercept (ENBREL SURECLICK) 50 MG/ML injection      FARXIGA 10 MG  TABS tablet Take 10 mg by mouth every morning.     ferrous sulfate 325 (65 FE) MG tablet every 14 (fourteen) days.     furosemide (LASIX) 20 MG tablet Take by mouth as needed.     losartan (COZAAR) 100 MG tablet losartan 100 mg tablet     magnesium oxide (MAG-OX) 400 MG tablet magnesium 400 mg (as magnesium oxide) tablet  Take 1 tablet twice a day by oral route.     Multiple Vitamin (MULTI-VITAMIN) tablet Take 1 tablet by mouth every morning.     niacin 500 MG CR capsule niacin  500 mg daily     pantoprazole (PROTONIX) 40 MG tablet pantoprazole 40 mg tablet,delayed release     warfarin (COUMADIN) 5 MG tablet Take 1 tablet (5 mg total) by mouth daily. 30 tablet 11   No current facility-administered medications for this visit.    Past Medical History:  Diagnosis Date   Adhesive capsulitis of right shoulder    Asthma    Hyperlipidemia    Hypertension    Iron deficiency anemia    Osteoarthritis    Paroxysmal atrial fibrillation (HCC)    Pernicious anemia    Psoriasis     Past Surgical History:  Procedure Laterality Date   CESAREAN SECTION     3   CHOLECYSTECTOMY  HIP ARTHROPLASTY Left    KNEE ARTHROPLASTY Bilateral      Social History   Tobacco Use   Smoking status: Former    Types: Cigarettes    Start date: 2001   Smokeless tobacco: Never  Vaping Use   Vaping Use: Never used  Substance Use Topics   Drug use: Never      Family History  Problem Relation Age of Onset   Heart disease Mother      Allergies  Allergen Reactions   Cefaclor Hives and Rash    REVIEW OF SYSTEMS (Negative unless checked)   Constitutional: [] Weight loss  [] Fever  [] Chills Cardiac: [] Chest pain   [] Chest pressure   [] Palpitations   [] Shortness of breath when laying flat   [] Shortness of breath at rest   [] Shortness of breath with exertion. Vascular:  [] Pain in legs with walking   [] Pain in legs at rest   [] Pain in legs when laying flat   [] Claudication   [] Pain in feet when  walking  [] Pain in feet at rest  [] Pain in feet when laying flat   [] History of DVT   [] Phlebitis   [x] Swelling in legs   [x] Varicose veins   [] Non-healing ulcers Pulmonary:   [] Uses home oxygen   [] Productive cough   [] Hemoptysis   [] Wheeze  [] COPD   [] Asthma Neurologic:  [] Dizziness  [] Blackouts   [] Seizures   [] History of stroke   [] History of TIA  [] Aphasia   [] Temporary blindness   [] Dysphagia   [] Weakness or numbness in arms   [] Weakness or numbness in legs Musculoskeletal:  [x] Arthritis   [] Joint swelling   [x] Joint pain   [] Low back pain Hematologic:  [] Easy bruising  [] Easy bleeding   [] Hypercoagulable state   [] Anemic  [] Hepatitis Gastrointestinal:  [] Blood in stool   [] Vomiting blood  [] Gastroesophageal reflux/heartburn   [] Abdominal pain Genitourinary:  [] Chronic kidney disease   [] Difficult urination  [] Frequent urination  [] Burning with urination   [] Hematuria Skin:  [] Rashes   [] Ulcers   [] Wounds Psychological:  [] History of anxiety   []  History of major depression.  Physical Examination  BP (!) 179/100 (BP Location: Left Arm)    Pulse 76    Resp 16    Wt 189 lb 3.2 oz (85.8 kg)    BMI 33.52 kg/m  Gen:  WD/WN, NAD Head: [place]/AT, No temporalis wasting. Ear/Nose/Throat: Hearing grossly intact, nares w/o erythema or drainage Eyes: Conjunctiva clear. Sclera non-icteric Neck: Supple.  Trachea midline Pulmonary:  Good air movement, no use of accessory muscles.  Cardiac: RRR, no JVD Vascular:  Vessel Right Left  Radial Palpable Palpable       Musculoskeletal: M/S 5/5 throughout.  No deformity or atrophy.  Trace right lower extremity edema, 1+ left lower extremity edema.  Prominent varicosities present bilaterally Neurologic: Sensation grossly intact in extremities.  Symmetrical.  Speech is fluent.  Psychiatric: Judgment intact, Mood & affect appropriate for pt's clinical situation. Dermatologic: No rashes or ulcers noted.  No cellulitis or open wounds.      Labs Recent  Results (from the past 2160 hour(s))  POCT INR     Status: None   Collection Time: 09/14/21 10:25 AM  Result Value Ref Range   INR 2.5 2.0 - 3.0  POCT INR     Status: None   Collection Time: 09/28/21 10:39 AM  Result Value Ref Range   INR 2.4 2.0 - 3.0  POCT INR     Status: Abnormal   Collection Time: 10/12/21  12:56 PM  Result Value Ref Range   INR 6.5 (A) 2.0 - 3.0  POCT INR     Status: None   Collection Time: 10/19/21  1:53 PM  Result Value Ref Range   INR 2.7 2.0 - 3.0    Radiology No results found.  Assessment/Plan Essential hypertension blood pressure control important in reducing the progression of atherosclerotic disease. On appropriate oral medications.     Chronic kidney disease, stage IV (severe) (HCC) Likely worsens lower extremity swelling.  Improvement in renal function has correlated with improvement in her lower extremity swelling  Paroxysmal atrial fibrillation (HCC) Poor cardiac function can worsen LE swelling and discolortion  Varicose veins of leg with pain, bilateral Duplex today shows no deep venous thrombosis or superficial thrombophlebitis in either lower extremity.  No venous reflux was seen on the right.  A short segment venous reflux was seen in the left great saphenous vein isolated in the mid thigh.  This is likely not clinically significant.  No role for intervention and she has improved significantly with medical and conservative therapy.  Return to clinic as needed.    Leotis Pain, MD  10/25/2021 9:12 AM    This note was created with Dragon medical transcription system.  Any errors from dictation are purely unintentional

## 2021-10-26 ENCOUNTER — Ambulatory Visit (INDEPENDENT_AMBULATORY_CARE_PROVIDER_SITE_OTHER): Payer: Medicare Other

## 2021-10-26 DIAGNOSIS — I480 Paroxysmal atrial fibrillation: Secondary | ICD-10-CM | POA: Diagnosis not present

## 2021-10-26 DIAGNOSIS — Z5181 Encounter for therapeutic drug level monitoring: Secondary | ICD-10-CM

## 2021-10-26 DIAGNOSIS — Z7901 Long term (current) use of anticoagulants: Secondary | ICD-10-CM | POA: Diagnosis not present

## 2021-10-26 LAB — POCT INR: INR: 2.2 (ref 2.0–3.0)

## 2021-10-26 NOTE — Patient Instructions (Signed)
-   continue warfarin dosage of 1 tablet (5 mg) every day - Recheck INR in 2 weeks Continue to have your greens on the same day (or days) each week [facility] [phone] ([place])

## 2021-10-27 ENCOUNTER — Ambulatory Visit (INDEPENDENT_AMBULATORY_CARE_PROVIDER_SITE_OTHER): Payer: Medicare Other

## 2021-10-27 ENCOUNTER — Other Ambulatory Visit: Payer: Self-pay

## 2021-10-27 DIAGNOSIS — I480 Paroxysmal atrial fibrillation: Secondary | ICD-10-CM

## 2021-10-27 LAB — ECHOCARDIOGRAM COMPLETE
AR max vel: 2.04 cm2
AV Area VTI: 2.14 cm2
AV Area mean vel: 2.22 cm2
AV Mean grad: 8 mmHg
AV Peak grad: 17.5 mmHg
Ao pk vel: 2.09 m/s
Area-P 1/2: 3.31 cm2
Calc EF: 63.8 %
P 1/2 time: 435 msec
S' Lateral: 2.5 cm
Single Plane A2C EF: 67.8 %
Single Plane A4C EF: 58.5 %

## 2021-10-28 NOTE — Telephone Encounter (Signed)
Patient picked ablation date of May 2. Will follow up with Dr. Quentin Ore in March to discuss since she has not seen him since November and answer any questions she my have. Note patient is on Coumadin now, she was previously on Xarelto in November. Will give CT and ablation instructions at office visit.  Verbalized understanding.

## 2021-10-28 NOTE — Addendum Note (Signed)
Addended by: Darrell Jewel on: 10/28/2021 11:24 AM   Modules accepted: Orders

## 2021-11-07 ENCOUNTER — Telehealth: Payer: Self-pay

## 2021-11-07 ENCOUNTER — Encounter: Payer: Self-pay | Admitting: *Deleted

## 2021-11-07 NOTE — Telephone Encounter (Signed)
Received refill request from pharmacy for Albuterol. Called patient she does not need refill was automatic from pharmacy she will call when refill is needed. No further action needed at this time.  ?

## 2021-11-09 ENCOUNTER — Ambulatory Visit (INDEPENDENT_AMBULATORY_CARE_PROVIDER_SITE_OTHER): Payer: Medicare Other

## 2021-11-09 ENCOUNTER — Other Ambulatory Visit: Payer: Self-pay

## 2021-11-09 DIAGNOSIS — Z7901 Long term (current) use of anticoagulants: Secondary | ICD-10-CM

## 2021-11-09 DIAGNOSIS — Z5181 Encounter for therapeutic drug level monitoring: Secondary | ICD-10-CM | POA: Diagnosis not present

## 2021-11-09 DIAGNOSIS — I480 Paroxysmal atrial fibrillation: Secondary | ICD-10-CM | POA: Diagnosis not present

## 2021-11-09 LAB — POCT INR: INR: 3.6 — AB (ref 2.0–3.0)

## 2021-11-09 NOTE — Patient Instructions (Signed)
-   skip warfarin tonight, then  ?- continue warfarin dosage of 1 tablet (5 mg) every day ?- Recheck INR in 2 weeks ?Continue to have your greens on the same day (or days) each week ?[facility] [phone] [facility]) ?

## 2021-11-23 ENCOUNTER — Ambulatory Visit (INDEPENDENT_AMBULATORY_CARE_PROVIDER_SITE_OTHER): Payer: Medicare Other

## 2021-11-23 ENCOUNTER — Other Ambulatory Visit: Payer: Self-pay

## 2021-11-23 ENCOUNTER — Ambulatory Visit (INDEPENDENT_AMBULATORY_CARE_PROVIDER_SITE_OTHER): Payer: Medicare Other | Admitting: Dermatology

## 2021-11-23 DIAGNOSIS — Z7901 Long term (current) use of anticoagulants: Secondary | ICD-10-CM

## 2021-11-23 DIAGNOSIS — L720 Epidermal cyst: Secondary | ICD-10-CM | POA: Diagnosis not present

## 2021-11-23 DIAGNOSIS — I480 Paroxysmal atrial fibrillation: Secondary | ICD-10-CM | POA: Diagnosis not present

## 2021-11-23 DIAGNOSIS — Z5181 Encounter for therapeutic drug level monitoring: Secondary | ICD-10-CM

## 2021-11-23 DIAGNOSIS — L409 Psoriasis, unspecified: Secondary | ICD-10-CM

## 2021-11-23 LAB — POCT INR: INR: 4.4 — AB (ref 2.0–3.0)

## 2021-11-23 MED ORDER — WYNZORA 0.005-0.064 % EX CREA: TOPICAL_CREAM | CUTANEOUS | 1 refills | Status: DC

## 2021-11-23 NOTE — Progress Notes (Signed)
? ?  New Patient Visit ? ?Subjective  ?Amy Underwood is a 76 y.o. 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 Pt tried and failed Clobetasol ointment and Calcipotriene cream in the past). ?The patient has spots, moles and lesions to be evaluated, some may be new or changing and the patient has concerns that these could be cancer. ? ?The following portions of the chart were reviewed this encounter and updated as appropriate:  ? Tobacco  Allergies  Meds  Problems  Med Hx  Surg Hx  Fam Hx   ?  ?Review of Systems:  No other skin or systemic complaints except as noted in HPI or Assessment and Plan. ? ?Objective  ?Well appearing patient in no apparent distress; mood and affect are within normal limits. ? ?A focused examination was performed including arms,legs, back. Relevant physical exam findings are noted in the Assessment and Plan. ? ?elbows, knees ?Well-demarcated erythematous papules/plaques with silvery scale, guttate pink scaly papules on the elbows ?Clear on her kness ? ? ? ? ? ? ?right mid back paraspinal ?1.2 cm Subcutaneous nodule  ? ? ?Assessment & Plan  ?Psoriasis ?elbows, knees ?Chronic and persistent condition with duration or expected duration over one year. Condition is bothersome/symptomatic for patient. Currently flared since stopping Enbrel. ?Psoriasis is a chronic non-curable, but treatable genetic/hereditary disease that may have other systemic features affecting other organ systems such as joints (Psoriatic Arthritis). It is associated with an increased risk of inflammatory bowel disease, heart disease, non-alcoholic fatty liver disease, and depression.    ? ?Start Wynzora cream apply to skin once a day  ? ?May consider Ainsley Spinner and Duobrii in the future  ? ?Related  Medications ?Calcipotriene-Betameth Diprop [facility]) 0.005-0.064 % CREA ?Apply to affected skin once a day ? ?Epidermal cyst ?right mid back paraspinal ?Benign-appearing. Exam most consistent with an epidermal inclusion cyst. Discussed that a cyst is a benign growth that can grow over time and sometimes get irritated or inflamed. Recommend observation if it is not bothersome. Discussed option of surgical excision to remove it if it is growing, symptomatic, or other changes noted. Please call for new or changing lesions so they can be evaluated. ? ?Return in about 2 months (around 01/23/2022) for Psoriasis . ? ?I, Marye Round, CMA, am acting as scribe for Sarina Ser, MD .  ?Documentation: I have reviewed the above documentation for accuracy and completeness, and I agree with the above. ? ?Sarina Ser, MD ? ?

## 2021-11-23 NOTE — Patient Instructions (Addendum)
-   skip warfarin tonight and tomorrow, then  ?- START NEW warfarin dosage of 1 tablet (5 mg) every day EXCEPT 1/2 TABLET ON MONDAYS AND FRIDAYS ?- Recheck INR next week at your appt w/ Dr. Quentin Ore ?Continue to have your greens on the same day (or days) each week ?Coumadin Clinic [phone] [facility]) ?

## 2021-11-23 NOTE — Patient Instructions (Signed)

## 2021-11-27 ENCOUNTER — Encounter: Payer: Self-pay | Admitting: Dermatology

## 2021-11-29 NOTE — Progress Notes (Signed)
?Electrophysiology Office Follow up Visit Note:   ? ?Date:  11/30/2021  ? ?ID:  Amy Underwood, DOB May 04, 1946, MRN [id number] ? ?PCP:  Pleas Koch, NP  ? ?[facility] Electrophysiologist:  Vickie Epley, MD  ? ? ?Interval History:   ? ?Amy Underwood is a 76 y.o. female who presents for a follow up visit. They were last seen in clinic 07/06/2021 for AF. She is maintained on dofetilide 152mcg (dose reduced 2/2 renal dysfunction). She was previously cared for by Dr Rosezella [place] in [place]. Ablation was discussed at our last appointment. She wanted some time to think about her options before finalizing a treatment plan. ?She called back to schedule an ablation and is currently scheduled for Jan 03, 2022. She is on coumadin but is interested in changing anticoagulants to xarelto.  She has had a lot of trouble keeping her INR is in range and she is very motivated to get off anticoagulants long-term.  She tells me that it really impacts her lifestyle.  She cannot afford NOAC.  She continues to have breakthrough episodes of atrial fibrillation despite treatment with dofetilide. ? ? ? ?  ? ?Past Medical History:  ?Diagnosis Date  ? Adhesive capsulitis of right shoulder   ? Asthma   ? Hyperlipidemia   ? Hypertension   ? Iron deficiency anemia   ? Osteoarthritis   ? Paroxysmal atrial fibrillation (HCC)   ? Pernicious anemia   ? Psoriasis   ? ? ?Past Surgical History:  ?Procedure Laterality Date  ? CESAREAN SECTION    ? 3  ? CHOLECYSTECTOMY    ? HIP ARTHROPLASTY Left   ? KNEE ARTHROPLASTY Bilateral   ? ? ?Current Medications: ?Current Meds  ?Medication Sig  ? albuterol (VENTOLIN HFA) 108 (90 Base) MCG/ACT inhaler Inhale 1-2 puffs into the lungs every 6 (six) hours as needed for wheezing or shortness of breath.  ? atorvastatin (LIPITOR) 20 MG tablet atorvastatin 20 mg tablet ? TAKE 1 TABLET BY MOUTH  DAILY  ? calcipotriene (DOVONOX) 0.005 % cream calcipotriene 0.005 % topical cream  ? Calcipotriene-Betameth Diprop  (WYNZORA) 0.005-0.064 % CREA Apply to affected skin once a day  ? Cholecalciferol 1.25 MG (50000 UT) capsule cholecalciferol (vitamin D3) 1,250 mcg (50,000 unit) capsule ? Take 1 capsule every week by oral route.  ? clobetasol cream (TEMOVATE) 0.05 % clobetasol 0.05 % topical cream ? APPLY TO AFFECTED AREA OF BODY TWICE A  DAY FOR 2 WEEKS OUT OF THE MONTH AS NEEDED FOR FLARES EXCLUDE FACE AND GENITAL  ? cyanocobalamin 1000 MCG tablet Vitamin B-12  1,000 mcg tablet ? Take 1 tablet every day by oral route.  ? diltiazem (CARDIZEM CD) 240 MG 24 hr capsule Take 240 mg by mouth daily.  ? dofetilide (TIKOSYN) 125 MCG capsule dofetilide 125 mcg capsule  ? FARXIGA 10 MG TABS tablet Take 10 mg by mouth every morning.  ? ferrous sulfate 325 (65 FE) MG tablet every 14 (fourteen) days.  ? furosemide (LASIX) 20 MG tablet Take by mouth as needed.  ? losartan (COZAAR) 100 MG tablet losartan 100 mg tablet  ? magnesium oxide (MAG-OX) 400 MG tablet magnesium 400 mg (as magnesium oxide) tablet ? Take 1 tablet twice a day by oral route.  ? Multiple Vitamin (MULTI-VITAMIN) tablet Take 1 tablet by mouth every morning.  ? niacin 500 MG CR capsule niacin ? 500 mg daily  ? pantoprazole (PROTONIX) 40 MG tablet pantoprazole 40 mg tablet,delayed release  ? warfarin (COUMADIN) 5 MG  tablet Take 1 tablet (5 mg total) by mouth daily.  ?  ? ?Allergies:   Cefaclor  ? ?Social History  ? ?Socioeconomic History  ? Marital status: Widowed  ?  Spouse name: Not on file  ? Number of children: Not on file  ? Years of education: Not on file  ? Highest education level: Not on file  ?Occupational History  ? Not on file  ?Tobacco Use  ? Smoking status: Former  ?  Types: Cigarettes  ?  Start date: 2001  ? Smokeless tobacco: Never  ?Vaping Use  ? Vaping Use: Never used  ?Substance and Sexual Activity  ? Alcohol use: Not on file  ? Drug use: Never  ? Sexual activity: Not Currently  ?Other Topics Concern  ? Not on file  ?Social History Narrative  ? Not on file   ? ?Social Determinants of Health  ? ?Financial Resource Strain: Not on file  ?Food Insecurity: Not on file  ?Transportation Needs: Not on file  ?Physical Activity: Not on file  ?Stress: Not on file  ?Social Connections: Not on file  ?  ? ?Family History: ?The patient's family history includes Heart disease in her mother. ? ?ROS:   ?Please see the history of present illness.    ?All other systems reviewed and are negative. ? ?EKGs/Labs/Other Studies Reviewed:   ? ?The following studies were reviewed today: ? ? ?EKG:  The ekg ordered today demonstrates sinus rhythm.  QTc 460 ms. ? ?Recent Labs: ?05/25/2021: ALT 20; Hemoglobin 12.7; Platelets 160.0 ?06/02/2021: BUN 45; Creatinine, Ser 1.84; Potassium 5.2 No hemolysis seen; Sodium 139  ?Recent Lipid Panel ?   ?Component Value Date/Time  ? CHOL 154 05/25/2021 1047  ? TRIG 133.0 05/25/2021 1047  ? HDL 60.60 05/25/2021 1047  ? CHOLHDL 3 05/25/2021 1047  ? VLDL 26.6 05/25/2021 1047  ? [place] 66 05/25/2021 1047  ? ? ?Physical Exam:   ? ?VS:  BP 140/80 (BP Location: Left Arm, Patient Position: Sitting, Cuff Size: Large)   Pulse 71   Ht 5\' 3"  (1.6 m)   Wt 186 lb (84.4 kg)   SpO2 98%   BMI 32.95 kg/m?    ? ?Wt Readings from Last 3 Encounters:  ?11/30/21 186 lb (84.4 kg)  ?10/25/21 189 lb 3.2 oz (85.8 kg)  ?10/19/21 189 lb (85.7 kg)  ?  ? ?GEN:  Well nourished, well developed in no acute distress ?HEENT: Normal ?NECK: No JVD; No carotid bruits ?LYMPHATICS: No lymphadenopathy ?CARDIAC: RRR, no murmurs, rubs, gallops ?RESPIRATORY:  Clear to auscultation without rales, wheezing or rhonchi  ?ABDOMEN: Soft, non-tender, non-distended ?MUSCULOSKELETAL:  No edema; No deformity  ?SKIN: Warm and dry ?NEUROLOGIC:  Alert and oriented x 3 ?PSYCHIATRIC:  Normal affect  ? ? ? ?  ? ?ASSESSMENT:   ? ?1. Paroxysmal atrial fibrillation (HCC)   ?2. Essential hypertension   ?3. Encounter for long-term (current) use of high-risk medication   ? ?PLAN:   ? ?In order of problems listed  above: ? ?#Persistent atrial fibrillation ?Improved control on Tikosyn but still having breakthrough episodes.  The patient would like to proceed with ablation.  This is already scheduled for her.  For now, continue Coumadin, dofetilide, diltiazem. ? ?She expresses strong desire during our clinic appointment today to avoid long-term exposure anticoagulation.  She cannot afford Eliquis/Xarelto and is interested in getting off of Coumadin.  I did discuss left atrial appendage occlusion during today's appointment and she is interested in proceeding.  I discussed  the procedural details, the risks, recovery and need for short-term anticoagulation following the procedure. ? ?------------------------- ? ?I have seen Amy Underwood in the office today who is being considered for a Watchman left atrial appendage closure device. I believe they will benefit from this procedure given their history of atrial fibrillation, CHA2DS2-VASc score of 3 and unadjusted ischemic stroke rate of 3.2% per year. The patient's chart has been reviewed and I feel that they would be a candidate for short term oral anticoagulation after Watchman implant.  ? ?It is my belief that after undergoing a LAA closure procedure, Amy Underwood will not need long term anticoagulation which eliminates anticoagulation side effects and major bleeding risk.  ? ?Procedural risks for the Watchman implant have been reviewed with the patient including a 0.5% risk of stroke, <1% risk of perforation and <1% risk of device embolization.  ? ? ?The published clinical data on the safety and effectiveness of WATCHMAN include but are not limited to the following: ?- Holmes DR, Mechele Claude, Sick P et al. for the PROTECT AF Investigators. Percutaneous closure of the left atrial appendage versus warfarin therapy for prevention of stroke in patients with atrial fibrillation: a randomised non-inferiority trial. Lancet 2009; 374: 534-42. ?- Mechele Claude, Doshi SK, Abelardo Diesel D et  al. on behalf of the PROTECT AF Investigators. Percutaneous Left Atrial Appendage Closure for Stroke Prophylaxis in Patients With Atrial Fibrillation 2.3-Year Follow-up of the PROTECT AF (Watchman Left Atrial Appendage System for

## 2021-11-30 ENCOUNTER — Ambulatory Visit (INDEPENDENT_AMBULATORY_CARE_PROVIDER_SITE_OTHER): Payer: Medicare Other | Admitting: Cardiology

## 2021-11-30 ENCOUNTER — Other Ambulatory Visit: Payer: Self-pay

## 2021-11-30 ENCOUNTER — Encounter: Payer: Self-pay | Admitting: Cardiology

## 2021-11-30 ENCOUNTER — Encounter: Payer: Self-pay | Admitting: *Deleted

## 2021-11-30 ENCOUNTER — Ambulatory Visit (INDEPENDENT_AMBULATORY_CARE_PROVIDER_SITE_OTHER): Payer: Medicare Other

## 2021-11-30 VITALS — BP 140/80 | HR 71 | Ht 63.0 in | Wt 186.0 lb

## 2021-11-30 DIAGNOSIS — I10 Essential (primary) hypertension: Secondary | ICD-10-CM | POA: Diagnosis not present

## 2021-11-30 DIAGNOSIS — Z7901 Long term (current) use of anticoagulants: Secondary | ICD-10-CM | POA: Diagnosis not present

## 2021-11-30 DIAGNOSIS — I4819 Other persistent atrial fibrillation: Secondary | ICD-10-CM

## 2021-11-30 DIAGNOSIS — I480 Paroxysmal atrial fibrillation: Secondary | ICD-10-CM | POA: Diagnosis not present

## 2021-11-30 DIAGNOSIS — Z5181 Encounter for therapeutic drug level monitoring: Secondary | ICD-10-CM | POA: Diagnosis not present

## 2021-11-30 DIAGNOSIS — Z79899 Other long term (current) drug therapy: Secondary | ICD-10-CM

## 2021-11-30 LAB — POCT INR: INR: 2 (ref 2.0–3.0)

## 2021-11-30 NOTE — Patient Instructions (Addendum)
Medication Instructions:  ?- Your physician recommends that you continue on your current medications as directed. Please refer to the Current Medication list given to you today.  ? ?*If you need a refill on your cardiac medications before your next appointment, please call your pharmacy* ? ? ?Lab Work: ?- Your physician recommends that you have lab work today: BMP/ Magnesium/ INR ? ?If you have labs (blood work) drawn today and your tests are completely normal, you will receive your results only by: ?MyChart Message (if you have MyChart) OR ?A paper copy in the mail ?If you have any lab test that is abnormal or we need to change your treatment, we will call you to review the results. ? ? ?Testing/Procedures: ? ?1) Cardiac CT: ?Your physician has requested that you have cardiac CT. Cardiac computed tomography (CT) is a painless test that uses an x-ray machine to take clear, detailed pictures of your heart.  ? ? ? ?Your cardiac CT will be scheduled at:  ? ?[facility] ?[address] ?[place], [place] [address] ?[phone] ? ?Please arrive at the [facility] and Children's Entrance (Entrance C2) of [facility], [place] 30 minutes prior to test start time. ?You can use the FREE valet parking offered at entrance C (encouraged to control the heart rate for the test)  ?Proceed to the [facility] Radiology Department (first floor) to check-in and test prep. ? ?All radiology patients and guests should use entrance C2 at [facility], accessed from [facility], even though the hospital's physical address listed is [address]. ? ? ? ? ?Please follow these instructions carefully (unless otherwise directed): ? ? ? ?On the Night Before the Test: ?Be sure to Drink plenty of water. ?Do not consume any caffeinated/decaffeinated beverages or chocolate 12 hours prior to your test. ?Do not take any antihistamines 12 hours prior to your test. ? ?On the Day of the Test: ?Drink plenty of water  until 1 hour prior to the test. ?Do not eat any food 4 hours prior to the test. ?You may take your regular medications prior to the test.  ?HOLD Furosemide morning of the test. ?FEMALES- please wear underwire-free bra if available, avoid dresses & tight clothing   ? ?     ?After the Test: ?Drink plenty of water. ?After receiving IV contrast, you may experience a mild flushed feeling. This is normal. ?On occasion, you may experience a mild rash up to 24 hours after the test. This is not dangerous. If this occurs, you can take Benadryl 25 mg and increase your fluid intake. ?If you experience trouble breathing, this can be serious. If it is severe call 911 IMMEDIATELY. If it is mild, please call our office. ? ? ?We will call to schedule your test 2-4 weeks out understanding that some insurance companies will need an authorization prior to the service being performed.  ? ?For non-scheduling related questions, please contact the cardiac imaging nurse navigator should you have any questions/concerns: ?Marchia Bond, Cardiac Imaging Nurse Navigator ?Gordy Clement, Cardiac Imaging Nurse Navigator ?[facility] ?Direct Office Dial: [phone]  ? ?For scheduling needs, including cancellations and rescheduling, please call [place], [phone]. ? ? ?2) Atrial Fibrillation Ablation: ?- Your physician has recommended that you have an ablation. Catheter ablation is a medical procedure used to treat some cardiac arrhythmias (irregular heartbeats). During catheter ablation, a long, thin, flexible tube is put into a blood vessel in your groin (upper thigh), or  neck. This tube is called an ablation catheter. It is then guided to your heart through the blood vessel. Radio frequency waves destroy small areas of heart tissue where abnormal heartbeats may cause an arrhythmia to start. Please see the instruction sheet given to you today. ? ? ? ?Follow-Up: ?At [facility], you and your health needs are our  priority.  As part of our continuing mission to provide you with exceptional heart care, we have created designated Provider Care Teams.  These Care Teams include your primary Cardiologist (physician) and Advanced Practice Providers (APPs -  Physician Assistants and Nurse Practitioners) who all work together to provide you with the care you need, when you need it. ? ?We recommend signing up for the patient portal called "MyChart".  Sign up information is provided on this After Visit Summary.  MyChart is used to connect with patients for Virtual Visits (Telemedicine).  Patients are able to view lab/test results, encounter notes, upcoming appointments, etc.  Non-urgent messages can be sent to your provider as well.   ?To learn more about what you can do with MyChart, go to [email or web address].   ? ? ?Your next appointment:   ?1) 4 weeks (from Jan 03, 2022) with the [facility] in [place] ? ?The format for your next appointment:   ?In Person ? ?Provider:   ?As above   ? ?Lenice Llamas, the Watchman Nurse Navigator, will call you after your CT once the [facility] Team has reviewed your imaging for an update on proceedings. [place]'s direct number is [phone] if you need assistance.  ? ?Other Instructions ? ?Cardiac Ablation ?Cardiac ablation is a procedure to destroy (ablate) some heart tissue that is sending bad signals. These bad signals cause problems in heart rhythm. ?The heart has many areas that make these signals. If there are problems in these areas, they can make the heart beat in a way that is not normal. Destroying some tissues can help make the heart rhythm normal. ?Tell your doctor about: ?Any allergies you have. ?All medicines you are taking. These include vitamins, herbs, eye drops, creams, and over-the-counter medicines. ?Any problems you or family members have had with medicines that make you fall asleep (anesthetics). ?Any blood disorders you have. ?Any surgeries you have had. ?Any medical conditions  you have, such as kidney failure. ?Whether you are pregnant or may be pregnant. ?What are the risks? ?This is a safe procedure. But problems may occur, including: ?Infection. ?Bruising and bleeding. ?Bleeding into the chest. ?Stroke or blood clots. ?Damage to nearby areas of your body. ?Allergies to medicines or dyes. ?The need for a pacemaker if the normal system is damaged. ?Failure of the procedure to treat the problem. ?What happens before the procedure? ?Medicines ?Ask your doctor about: ?Changing or stopping your normal medicines. This is important. ?Taking aspirin and ibuprofen. Do not take these medicines unless your doctor tells you to take them. ?Taking other medicines, vitamins, herbs, and supplements. ?General instructions ?Follow instructions from your doctor about what you cannot eat or drink. ?Plan to have someone take you home from the hospital or clinic. ?If you will be going home right after the procedure, plan to have someone with you for 24 hours. ?Ask your doctor what steps will be taken to prevent infection. ?What happens during the procedure? ? ?An IV tube will be put into one of your veins. ?You will be given a medicine to help you relax. ?The skin on your neck or groin will be numbed. ?A  cut (incision) will be made in your neck or groin. A needle will be put through your cut and into a large vein. ?A tube (catheter) will be put into the needle. The tube will be moved to your heart. ?Dye may be put through the tube. This helps your doctor see your heart. ?Small devices (electrodes) on the tube will send out signals. ?A type of energy will be used to destroy some heart tissue. ?The tube will be taken out. ?Pressure will be held on your cut. This helps stop bleeding. ?A bandage will be put over your cut. ?The exact procedure may vary among doctors and hospitals. ?What happens after the procedure? ?You will be watched until you leave the hospital or clinic. This includes checking your heart  rate, breathing rate, oxygen, and blood pressure. ?Your cut will be watched for bleeding. You will need to lie still for a few hours. ?Do not drive for 24 hours or as long as your doctor tells you. ?Summary

## 2021-11-30 NOTE — Patient Instructions (Signed)
-   continue warfarin dosage of 1 tablet (5 mg) every day EXCEPT 1/2 TABLET ON MONDAYS AND FRIDAYS ?- Recheck INR in 3 weeks ?Continue to have your greens on the same day (or days) each week ?Coumadin Clinic [phone] [facility]) ?

## 2021-12-01 LAB — BASIC METABOLIC PANEL
BUN/Creatinine Ratio: 20 (ref 12–28)
BUN: 28 mg/dL — ABNORMAL HIGH (ref 8–27)
CO2: 22 mmol/L (ref 20–29)
Calcium: 9.2 mg/dL (ref 8.7–10.3)
Chloride: 108 mmol/L — ABNORMAL HIGH (ref 96–106)
Creatinine, Ser: 1.39 mg/dL — ABNORMAL HIGH (ref 0.57–1.00)
Glucose: 89 mg/dL (ref 70–99)
Potassium: 4.6 mmol/L (ref 3.5–5.2)
Sodium: 143 mmol/L (ref 134–144)
eGFR: 40 mL/min/{1.73_m2} — ABNORMAL LOW (ref >59)

## 2021-12-01 LAB — MAGNESIUM: Magnesium: 1.9 mg/dL (ref 1.6–2.3)

## 2021-12-13 ENCOUNTER — Other Ambulatory Visit (INDEPENDENT_AMBULATORY_CARE_PROVIDER_SITE_OTHER): Payer: Medicare Other

## 2021-12-13 DIAGNOSIS — Z01818 Encounter for other preprocedural examination: Secondary | ICD-10-CM

## 2021-12-13 DIAGNOSIS — I4891 Unspecified atrial fibrillation: Secondary | ICD-10-CM

## 2021-12-13 DIAGNOSIS — I480 Paroxysmal atrial fibrillation: Secondary | ICD-10-CM

## 2021-12-14 ENCOUNTER — Encounter: Payer: Self-pay | Admitting: Family Medicine

## 2021-12-14 ENCOUNTER — Ambulatory Visit (INDEPENDENT_AMBULATORY_CARE_PROVIDER_SITE_OTHER): Payer: Medicare Other | Admitting: Family Medicine

## 2021-12-14 VITALS — BP 138/78 | HR 79 | Temp 97.8°F | Ht 63.0 in | Wt 185.0 lb

## 2021-12-14 DIAGNOSIS — I480 Paroxysmal atrial fibrillation: Secondary | ICD-10-CM | POA: Diagnosis not present

## 2021-12-14 DIAGNOSIS — M1A9XX0 Chronic gout, unspecified, without tophus (tophi): Secondary | ICD-10-CM | POA: Insufficient documentation

## 2021-12-14 DIAGNOSIS — N184 Chronic kidney disease, stage 4 (severe): Secondary | ICD-10-CM | POA: Diagnosis not present

## 2021-12-14 DIAGNOSIS — M109 Gout, unspecified: Secondary | ICD-10-CM

## 2021-12-14 LAB — BASIC METABOLIC PANEL
BUN/Creatinine Ratio: 24 (ref 12–28)
BUN: 37 mg/dL — ABNORMAL HIGH (ref 8–27)
CO2: 22 mmol/L (ref 20–29)
Calcium: 8.8 mg/dL (ref 8.7–10.3)
Chloride: 108 mmol/L — ABNORMAL HIGH (ref 96–106)
Creatinine, Ser: 1.56 mg/dL — ABNORMAL HIGH (ref 0.57–1.00)
Glucose: 103 mg/dL — ABNORMAL HIGH (ref 70–99)
Potassium: 4.5 mmol/L (ref 3.5–5.2)
Sodium: 146 mmol/L — ABNORMAL HIGH (ref 134–144)
eGFR: 35 mL/min/{1.73_m2} — ABNORMAL LOW (ref >59)

## 2021-12-14 LAB — CBC WITH DIFFERENTIAL/PLATELET
Basophils Absolute: 0 10*3/uL (ref 0.0–0.2)
Basos: 0 %
EOS (ABSOLUTE): 0.1 10*3/uL (ref 0.0–0.4)
Eos: 1 %
Hematocrit: 38.6 % (ref 34.0–46.6)
Hemoglobin: 13.1 g/dL (ref 11.1–15.9)
Immature Grans (Abs): 0 10*3/uL (ref 0.0–0.1)
Immature Granulocytes: 0 %
Lymphocytes Absolute: 1.5 10*3/uL (ref 0.7–3.1)
Lymphs: 20 %
MCH: 30 pg (ref 26.6–33.0)
MCHC: 33.9 g/dL (ref 31.5–35.7)
MCV: 88 fL (ref 79–97)
Monocytes Absolute: 0.6 10*3/uL (ref 0.1–0.9)
Monocytes: 8 %
Neutrophils Absolute: 5.3 10*3/uL (ref 1.4–7.0)
Neutrophils: 71 %
Platelets: 225 10*3/uL (ref 150–450)
RBC: 4.37 x10E6/uL (ref 3.77–5.28)
RDW: 13.6 % (ref 11.7–15.4)
WBC: 7.6 10*3/uL (ref 3.4–10.8)

## 2021-12-14 LAB — URIC ACID: Uric Acid, Serum: 6.8 mg/dL (ref 2.4–7.0)

## 2021-12-14 LAB — PROTIME-INR
INR: 3.7 — ABNORMAL HIGH (ref 0.9–1.2)
Prothrombin Time: 36.1 s — ABNORMAL HIGH (ref 9.1–12.0)

## 2021-12-14 MED ORDER — PREDNISONE 10 MG PO TABS: ORAL_TABLET | ORAL | 0 refills | Status: AC

## 2021-12-14 NOTE — Progress Notes (Signed)
? ? Patient ID: Amy Underwood, female    DOB: Sep 15, 1945, 75 y.o.   MRN: [id number] ? ?This visit was conducted in person. ? ?BP 138/78   Pulse 79   Temp 97.8 ?F (36.6 ?C) (Temporal)   Ht '5\' 3"'  (1.6 m)   Wt 185 lb (83.9 kg)   SpO2 96%   BMI 32.77 kg/m?   ? ?CC: left foot pain and swelling ?Subjective:  ? ?HPI: ?Amy Underwood is a 76 y.o. female presenting on 12/14/2021 for Foot Swelling (C/o L foot swelling/pain.  Started about 1 mo ago. Denies injury.  Told by Amy Underwood pt has gout, prescribed prednisone. Improved but now is back. ) ? ? ?Patient of Tawni Millers new to me presents with several week history of left > right foot pain, swelling, redness.  ?Denies inciting trauma/injury or falls. ?No fevers/chills.  ?She is on niacin 564m daily (longterm) and recently started farxiga.  ? ?Does have history of gout (1st time 6 months ago). She saw [facility] 1 month ago diagnosed with podagra, treated with prednisone course 7d 580mprednisone course with benefit but never went fully away. 2 wks ago pain flared to both feet again. She received second prednisone course (4030m5days) again with mild improvement but left foot symptoms worsening.  ? ?No recent shrimp, organ meats, red meats, beer.  ? ?Known afib on Tikosyn 125m64maily. Planned ablation 01/03/2022 then LAA closure device afterwards. ?She is on coumadin - difficulty getting this regulated. NOAC unaffordable.  ?Upcoming INR next Wednesday.  ? 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  ?   ? ?Relevant past medical, surgical, family and social history reviewed and updated as indicated. Interim medical history since our last visit reviewed. ?Allergies and medications reviewed and updated. ?Outpatient Medications Prior to Visit  ?Medication Sig Dispense Refill  ? albuterol (VENTOLIN HFA) 108 (90 Base) MCG/ACT inhaler Inhale 1-2 puffs into the lungs every 6 (six) hours as needed for wheezing or shortness of breath. 6.7 g 0  ? atorvastatin (LIPITOR) 20 MG  tablet atorvastatin 20 mg tablet ? TAKE 1 TABLET BY MOUTH  DAILY    ? calcipotriene (DOVONOX) 0.005 % cream calcipotriene 0.005 % topical cream    ? Calcipotriene-Betameth Diprop (WYNZORA) 0.005-0.064 % CREA Apply to affected skin once a day 60 g 1  ? Cholecalciferol 1.25 MG (50000 UT) capsule cholecalciferol (vitamin D3) 1,250 mcg (50,000 unit) capsule ? Take 1 capsule every week by oral route.    ? clobetasol cream (TEMOVATE) 0.05 % clobetasol 0.05 % topical cream ? APPLY TO AFFECTED AREA OF BODY TWICE A  DAY FOR 2 WEEKS OUT OF THE MONTH AS NEEDED FOR FLARES EXCLUDE FACE AND GENITAL    ? cyanocobalamin 1000 MCG tablet Vitamin B-12  1,000 mcg tablet ? Take 1 tablet every day by oral route.    ? diltiazem (CARDIZEM CD) 240 MG 24 hr capsule Take 240 mg by mouth daily.    ? dofetilide (TIKOSYN) 125 MCG capsule dofetilide 125 mcg capsule    ? FARXIGA 10 MG TABS tablet Take 10 mg by mouth every morning.    ? ferrous sulfate 325 (65 FE) MG tablet every 14 (fourteen) days.    ? furosemide (LASIX) 20 MG tablet Take by mouth as needed.    ? losartan (COZAAR) 100 MG tablet losartan 100 mg tablet    ? magnesium oxide (MAG-OX) 400 MG tablet magnesium 400 mg (as magnesium oxide) tablet ? Take 1 tablet twice a day  by oral route.    ? Multiple Vitamin (MULTI-VITAMIN) tablet Take 1 tablet by mouth every morning.    ? pantoprazole (PROTONIX) 40 MG tablet pantoprazole 40 mg tablet,delayed release    ? warfarin (COUMADIN) 5 MG tablet Take 1 tablet (5 mg total) by mouth daily. 30 tablet 11  ? niacin 500 MG CR capsule niacin ? 500 mg daily    ? ?No facility-administered medications prior to visit.  ?  ? ?Per HPI unless specifically indicated in ROS section below ?Review of Systems ? ?Objective:  ?BP 138/78   Pulse 79   Temp 97.8 ?F (36.6 ?C) (Temporal)   Ht '5\' 3"'  (1.6 m)   Wt 185 lb (83.9 kg)   SpO2 96%   BMI 32.77 kg/m?   ?Wt Readings from Last 3 Encounters:  ?12/14/21 185 lb (83.9 kg)  ?11/30/21 186 lb (84.4 kg)  ?10/25/21 189  lb 3.2 oz (85.8 kg)  ?  ?  ?Physical Exam ?Vitals and nursing note reviewed.  ?Constitutional:   ?   Appearance: Normal appearance. She is not ill-appearing.  ?   Comments: Ambulates with cane, wearing post-op shoe to left foot  ?Musculoskeletal:     ?   General: Swelling and tenderness present.  ?   Right lower leg: No edema.  ?   Left lower leg: No edema.  ?   Comments:  ?L 1st MTPJ swollen, red, exquisitely tender to touch ?No redness to sole of foot ?2+DP  ?No streaking redness up foot  ?Skin: ?   General: Skin is warm and dry.  ?   Findings: Erythema present. No rash.  ?Neurological:  ?   Mental Status: She is alert.  ?Psychiatric:     ?   Mood and Affect: Mood normal.     ?   Behavior: Behavior normal.  ? ?   ?Results for orders placed or performed in visit on 12/13/21  ?CBC w/Diff  ?Result Value Ref Range  ? WBC 7.6 3.4 - 10.8 x10E3/uL  ? RBC 4.37 3.77 - 5.28 x10E6/uL  ? Hemoglobin 13.1 11.1 - 15.9 g/dL  ? Hematocrit 38.6 34.0 - 46.6 %  ? MCV 88 79 - 97 fL  ? MCH 30.0 26.6 - 33.0 pg  ? MCHC 33.9 31.5 - 35.7 g/dL  ? RDW 13.6 11.7 - 15.4 %  ? Platelets 225 150 - 450 x10E3/uL  ? Neutrophils 71 Not Estab. %  ? Lymphs 20 Not Estab. %  ? Monocytes 8 Not Estab. %  ? Eos 1 Not Estab. %  ? Basos 0 Not Estab. %  ? Neutrophils Absolute 5.3 1.4 - 7.0 x10E3/uL  ? Lymphocytes Absolute 1.5 0.7 - 3.1 x10E3/uL  ? Monocytes Absolute 0.6 0.1 - 0.9 x10E3/uL  ? EOS (ABSOLUTE) 0.1 0.0 - 0.4 x10E3/uL  ? Basophils Absolute 0.0 0.0 - 0.2 x10E3/uL  ? Immature Granulocytes 0 Not Estab. %  ? Immature Grans (Abs) 0.0 0.0 - 0.1 x10E3/uL  ?Basic Metabolic Panel (BMET)  ?Result Value Ref Range  ? Glucose 103 (H) 70 - 99 mg/dL  ? BUN 37 (H) 8 - 27 mg/dL  ? Creatinine, Ser 1.56 (H) 0.57 - 1.00 mg/dL  ? eGFR 35 (L) >59 mL/min/1.73  ? BUN/Creatinine Ratio 24 12 - 28  ? Sodium 146 (H) 134 - 144 mmol/L  ? Potassium 4.5 3.5 - 5.2 mmol/L  ? Chloride 108 (H) 96 - 106 mmol/L  ? CO2 22 20 - 29 mmol/L  ? Calcium 8.8 8.7 -  10.3 mg/dL  ?Protime-INR   ?Result Value Ref Range  ? INR 3.7 (H) 0.9 - 1.2  ? Prothrombin Time 36.1 (H) 9.1 - 12.0 sec  ? ? ?Assessment & Plan:  ? ?Problem List Items Addressed This Visit   ? ? Paroxysmal atrial fibrillation (HCC)  ?  Seeing EP, upcoming ablation, then LAA closure.  ?  ?  ? Chronic kidney disease, stage IV (severe) ([place])  ?  She does have nephrology f/u scheduled next week.  ?  ?  ? Podagra - Primary  ?  Story/exam consistent with L great toe podagra/acute gout flare. Unknown trigger. She is on niacin which can increase uric acid levels - rec stop this. Discussed possible starting allopurinol pending results off niacin.  ?Avoid colchicine and NSAIDs in CKD stage 4.  ?Rx prolonged prednisone taper over 12 days. Advised let us know if flare recurs as dose is lowered. Further supportive measures reviewed.  ?No DM hx. ?Check gout levels.  ?  ?  ? Relevant Medications  ? predniSONE (DELTASONE) 10 MG tablet  ? Other Relevant Orders  ? Uric acid  ?  ? ?Meds ordered this encounter  ?Medications  ? predniSONE (DELTASONE) 10 MG tablet  ?  Sig: Take 5 tablets (50 mg total) by mouth daily for 4 days, THEN 4 tablets (40 mg total) daily for 2 days, THEN 3 tablets (30 mg total) daily for 2 days, THEN 2 tablets (20 mg total) daily for 2 days, THEN 1 tablet (10 mg total) daily for 2 days.  ?  Dispense:  40 tablet  ?  Refill:  0  ? ?Orders Placed This Encounter  ?Procedures  ? Uric acid  ? ? ? ?Patient instructions: ?You have ongoing gout flare.  ?Treat with extended prednisone taper - sent to pharmacy. ?You may need to start daily gout medicine (allopurinol) but we want to have gout flare fully resolved before starting.  ?Stop niacin first - see if that's enough to control gout.  ?Labs today.  ? ?Follow up plan: ?No follow-ups on file. ? ?Ria Bush, MD   ?

## 2021-12-14 NOTE — Patient Instructions (Addendum)
You have ongoing gout flare.  ?Treat with extended prednisone taper - sent to pharmacy. ?You may need to start daily gout medicine (allopurinol) but we want to have gout flare fully resolved before starting.  ?Stop niacin first - see if that's enough to control gout.  ?Labs today.  ? ?Gout ?Gout is a condition that causes painful swelling of the joints. Gout is a type of inflammation of the joints (arthritis). This condition is caused by having too much uric acid in the body. Uric acid is a chemical that forms when the body breaks down substances called purines. Purines are important for building body proteins. ?When the body has too much uric acid, sharp crystals can form and build up inside the joints. This causes pain and swelling. Gout attacks can happen quickly and may be very painful (acute gout). Over time, the attacks can affect more joints and become more frequent (chronic gout). Gout can also cause uric acid to build up under the skin and inside the kidneys. ?What are the causes? ?This condition is caused by too much uric acid in your blood. This can happen because: ?Your kidneys do not remove enough uric acid from your blood. This is the most common cause. ?Your body makes too much uric acid. This can happen with some cancers and cancer treatments. It can also occur if your body is breaking down too many red blood cells (hemolytic anemia). ?You eat too many foods that are high in purines. These foods include organ meats and some seafood. Alcohol, especially beer, is also high in purines. ?A gout attack may be triggered by trauma or stress. ?What increases the risk? ?You are more likely to develop this condition if you: ?Have a family history of gout. ?Are female and middle-aged. ?Are female and have gone through menopause. ?Are obese. ?Frequently drink alcohol, especially beer. ?Are dehydrated. ?Lose weight too quickly. ?Have an organ transplant. ?Have lead poisoning. ?Take certain medicines, including  aspirin, cyclosporine, diuretics, levodopa, and niacin. ?Have kidney disease. ?Have a skin condition called psoriasis. ?What are the signs or symptoms? ?An attack of acute gout happens quickly. It usually occurs in just one joint. The most common place is the big toe. Attacks often start at night. Other joints that may be affected include joints of the feet, ankle, knee, fingers, wrist, or elbow. Symptoms of this condition may include: ?Severe pain. ?Warmth. ?Swelling. ?Stiffness. ?Tenderness. The affected joint may be very painful to touch. ?Shiny, red, or purple skin. ?Chills and fever. ?Chronic gout may cause symptoms more frequently. More joints may be involved. You may also have white or yellow lumps (tophi) on your hands or feet or in other areas near your joints. ?How is this diagnosed? ?This condition is diagnosed based on your symptoms, medical history, and physical exam. You may have tests, such as: ?Blood tests to measure uric acid levels. ?Removal of joint fluid with a thin needle (aspiration) to look for uric acid crystals. ?X-rays to look for joint damage. ?How is this treated? ?Treatment for this condition has two phases: treating an acute attack and preventing future attacks. Acute gout treatment may include medicines to reduce pain and swelling, including: ?NSAIDs. ?Steroids. These are strong anti-inflammatory medicines that can be taken by mouth (orally) or injected into a joint. ?Colchicine. This medicine relieves pain and swelling when it is taken soon after an attack. It can be given by mouth or through an IV. ?Preventive treatment may include: ?Daily use of smaller doses of NSAIDs  or colchicine. ?Use of a medicine that reduces uric acid levels in your blood. ?Changes to your diet. You may need to see a dietitian about what to eat and drink to prevent gout. ?Follow these instructions at home: ?During a gout attack ? ?If directed, put ice on the affected area: ?Put ice in a plastic bag. ?Place a  towel between your skin and the bag. ?Leave the ice on for 20 minutes, 2-3 times a day. ?Raise (elevate) the affected joint above the level of your heart as often as possible. ?Rest the joint as much as possible. If the affected joint is in your leg, you may be given crutches to use. ?Follow instructions from your health care provider about eating or drinking restrictions. ?Avoiding future gout attacks ?Follow a low-purine diet as told by your dietitian or health care provider. Avoid foods and drinks that are high in purines, including liver, kidney, anchovies, asparagus, herring, mushrooms, mussels, and beer. ?Maintain a healthy weight or lose weight if you are overweight. If you want to lose weight, talk with your health care provider. It is important that you do not lose weight too quickly. ?Start or maintain an exercise program as told by your health care provider. ?Eating and drinking ?Drink enough fluids to keep your urine pale yellow. ?If you drink alcohol: ?Limit how much you use to: ?0-1 drink a day for women. ?0-2 drinks a day for men. ?Be aware of how much alcohol is in your drink. In the [place] one drink equals one 12 oz bottle of beer (355 mL) one 5 oz glass of wine (148 mL), or one 1? oz glass of hard liquor (44 mL). ?General instructions ?Take over-the-counter and prescription medicines only as told by your health care provider. ?Do not drive or use heavy machinery while taking prescription pain medicine. ?Return to your normal activities as told by your health care provider. Ask your health care provider what activities are safe for you. ?Keep all follow-up visits as told by your health care provider. This is important. ?Contact a health care provider if you have: ?Another gout attack. ?Continuing symptoms of a gout attack after 10 days of treatment. ?Side effects from your medicines. ?Chills or a fever. ?Burning pain when you urinate. ?Pain in your lower back or belly. ?Get help right away if  you: ?Have severe or uncontrolled pain. ?Cannot urinate. ?Summary ?Gout is painful swelling of the joints caused by inflammation. ?The most common site of pain is the big toe, but it can affect other joints in the body. ?Medicines and dietary changes can help to prevent and treat gout attacks. ?This information is not intended to replace advice given to you by your health care provider. Make sure you discuss any questions you have with your health care provider. ?Document Revised: 03/01/2018 Document Reviewed: 03/13/2018 ?Elsevier Patient Education ? [place]. ? ?

## 2021-12-14 NOTE — Assessment & Plan Note (Addendum)
Story/exam consistent with L great toe podagra/acute gout flare. Unknown trigger. She is on niacin which can increase uric acid levels - rec stop this. Discussed possible starting allopurinol pending results off niacin.  ?Avoid colchicine and NSAIDs in CKD stage 4.  ?Rx prolonged prednisone taper over 12 days. Advised let us know if flare recurs as dose is lowered. Further supportive measures reviewed.  ?No DM hx. ?Check gout levels.  ?

## 2021-12-14 NOTE — Assessment & Plan Note (Signed)
She does have nephrology f/u scheduled next week.  ?

## 2021-12-14 NOTE — Assessment & Plan Note (Addendum)
Seeing EP, upcoming ablation, then LAA closure.  ?

## 2021-12-20 ENCOUNTER — Other Ambulatory Visit: Payer: Self-pay

## 2021-12-20 DIAGNOSIS — K219 Gastro-esophageal reflux disease without esophagitis: Secondary | ICD-10-CM

## 2021-12-20 MED ORDER — PANTOPRAZOLE SODIUM 40 MG PO TBEC: DELAYED_RELEASE_TABLET | ORAL | 1 refills | Status: DC

## 2021-12-20 NOTE — Telephone Encounter (Signed)
Received refill request from pharmacy. Not prescribed by you in the past.  ?

## 2021-12-21 ENCOUNTER — Ambulatory Visit (INDEPENDENT_AMBULATORY_CARE_PROVIDER_SITE_OTHER): Payer: Medicare Other

## 2021-12-21 DIAGNOSIS — Z7901 Long term (current) use of anticoagulants: Secondary | ICD-10-CM | POA: Diagnosis not present

## 2021-12-21 DIAGNOSIS — Z5181 Encounter for therapeutic drug level monitoring: Secondary | ICD-10-CM

## 2021-12-21 DIAGNOSIS — I480 Paroxysmal atrial fibrillation: Secondary | ICD-10-CM

## 2021-12-21 LAB — POCT INR: INR: 4.6 — AB (ref 2.0–3.0)

## 2021-12-21 NOTE — Patient Instructions (Signed)
HOLD TONIGHT ONLY and then  continue warfarin dosage of 1 tablet (5 mg) every day EXCEPT 1/2 TABLET ON MONDAYS AND FRIDAYS ?- Recheck INR in 1 week ?Continue to have your greens on the same day (or days) each week ?[facility] [phone] ([place]);  Greens x 3 next 2 weeks ?

## 2021-12-28 ENCOUNTER — Telehealth (HOSPITAL_COMMUNITY): Payer: Self-pay | Admitting: *Deleted

## 2021-12-28 ENCOUNTER — Ambulatory Visit (INDEPENDENT_AMBULATORY_CARE_PROVIDER_SITE_OTHER): Payer: Medicare Other

## 2021-12-28 DIAGNOSIS — Z7901 Long term (current) use of anticoagulants: Secondary | ICD-10-CM | POA: Diagnosis not present

## 2021-12-28 DIAGNOSIS — I480 Paroxysmal atrial fibrillation: Secondary | ICD-10-CM | POA: Diagnosis not present

## 2021-12-28 DIAGNOSIS — Z5181 Encounter for therapeutic drug level monitoring: Secondary | ICD-10-CM | POA: Diagnosis not present

## 2021-12-28 LAB — POCT INR: INR: 4.7 — AB (ref 2.0–3.0)

## 2021-12-28 NOTE — Patient Instructions (Signed)
HOLD TONIGHT ONLY and 0.5 tablet Thursday night and then  continue warfarin dosage of 1 tablet (5 mg) every day EXCEPT 1/2 TABLET ON MONDAYS AND FRIDAYS ?- Recheck INR in 2 weeks ?Continue to have your greens on the same day (or days) each week ?[facility] [phone] ([place]);  Greens x 3 next 2 weeks ?

## 2021-12-28 NOTE — Telephone Encounter (Signed)
Reaching out to patient to offer assistance regarding upcoming cardiac imaging study; pt verbalizes understanding of appt date/time, parking situation and where to check in, pre-test NPO status and verified current allergies; name and call back number provided for further questions should they arise ? ?Gordy Clement RN Navigator Cardiac Imaging ? [facility] ?[phone] office ?[phone] cell ? ?Patient to take her daily medications for her test. She is aware to arrive at 8:30am. ?

## 2021-12-29 ENCOUNTER — Other Ambulatory Visit: Payer: Medicare Other

## 2021-12-29 ENCOUNTER — Telehealth: Payer: Self-pay | Admitting: *Deleted

## 2021-12-29 ENCOUNTER — Ambulatory Visit (HOSPITAL_COMMUNITY)
Admission: RE | Admit: 2021-12-29 | Discharge: 2021-12-29 | Disposition: A | Discharge status: Home / Self Care | Payer: Medicare Other | Source: Ambulatory Visit | Attending: Cardiology | Admitting: Cardiology

## 2021-12-29 ENCOUNTER — Encounter (HOSPITAL_COMMUNITY): Payer: Self-pay

## 2021-12-29 DIAGNOSIS — I4891 Unspecified atrial fibrillation: Secondary | ICD-10-CM | POA: Insufficient documentation

## 2021-12-29 MED ORDER — IOHEXOL 350 MG/ML SOLN
80.0000 mL | Freq: Once | INTRAVENOUS | Status: AC | PRN
  Administered 2021-12-29: 80 mL via INTRAVENOUS

## 2021-12-29 NOTE — Telephone Encounter (Signed)
Patient notified of new arrival time to hospital on May 2 at 5:30 am. Verbalized understanding and agreement.  ?

## 2022-01-02 NOTE — Pre-Procedure Instructions (Signed)
Instructed patient on the following items: ?Arrival time 0530 ?Nothing to eat or drink after midnight ?No meds AM of procedure ?Responsible person to drive you home and stay with you for 24 hrs ? ?Have you missed any doses of anti-coagulant Coumadin- hasn't missed any doses ?   ?

## 2022-01-03 ENCOUNTER — Encounter (HOSPITAL_COMMUNITY)
Admission: RE | Disposition: A | Discharge status: Home / Self Care | Payer: Medicare Other | Source: Home / Self Care | Attending: Cardiology

## 2022-01-03 ENCOUNTER — Ambulatory Visit (HOSPITAL_BASED_OUTPATIENT_CLINIC_OR_DEPARTMENT_OTHER): Payer: Medicare Other | Admitting: Certified Registered"

## 2022-01-03 ENCOUNTER — Encounter (HOSPITAL_COMMUNITY): Payer: Self-pay | Admitting: Cardiology

## 2022-01-03 ENCOUNTER — Ambulatory Visit (HOSPITAL_COMMUNITY): Payer: Medicare Other | Admitting: Certified Registered"

## 2022-01-03 ENCOUNTER — Other Ambulatory Visit: Payer: Self-pay

## 2022-01-03 ENCOUNTER — Ambulatory Visit (HOSPITAL_COMMUNITY)
Admission: RE | Admit: 2022-01-03 | Discharge: 2022-01-03 | Disposition: A | Discharge status: Home / Self Care | Payer: Medicare Other | Attending: Cardiology | Admitting: Cardiology

## 2022-01-03 DIAGNOSIS — I4819 Other persistent atrial fibrillation: Secondary | ICD-10-CM | POA: Diagnosis present

## 2022-01-03 DIAGNOSIS — I484 Atypical atrial flutter: Secondary | ICD-10-CM | POA: Insufficient documentation

## 2022-01-03 DIAGNOSIS — I10 Essential (primary) hypertension: Secondary | ICD-10-CM

## 2022-01-03 DIAGNOSIS — Z7901 Long term (current) use of anticoagulants: Secondary | ICD-10-CM | POA: Diagnosis not present

## 2022-01-03 DIAGNOSIS — Z87891 Personal history of nicotine dependence: Secondary | ICD-10-CM | POA: Insufficient documentation

## 2022-01-03 DIAGNOSIS — I4891 Unspecified atrial fibrillation: Secondary | ICD-10-CM | POA: Diagnosis not present

## 2022-01-03 DIAGNOSIS — M1990 Unspecified osteoarthritis, unspecified site: Secondary | ICD-10-CM | POA: Diagnosis not present

## 2022-01-03 DIAGNOSIS — N289 Disorder of kidney and ureter, unspecified: Secondary | ICD-10-CM | POA: Diagnosis not present

## 2022-01-03 DIAGNOSIS — Z79899 Other long term (current) drug therapy: Secondary | ICD-10-CM | POA: Diagnosis not present

## 2022-01-03 HISTORY — PX: ATRIAL FIBRILLATION ABLATION: EP1191

## 2022-01-03 LAB — PROTIME-INR
INR: 2 — ABNORMAL HIGH (ref 0.8–1.2)
Prothrombin Time: 22.9 seconds — ABNORMAL HIGH (ref 11.4–15.2)

## 2022-01-03 SURGERY — ATRIAL FIBRILLATION ABLATION
Anesthesia: General

## 2022-01-03 MED ORDER — LIDOCAINE 2% (20 MG/ML) 5 ML SYRINGE
INTRAMUSCULAR | Status: DC | PRN
  Administered 2022-01-03: 60 mg via INTRAVENOUS

## 2022-01-03 MED ORDER — COLCHICINE 0.6 MG PO TABS
0.6000 mg | ORAL_TABLET | Freq: Two times a day (BID) | ORAL | Status: DC
  Administered 2022-01-03: 0.6 mg via ORAL
  Filled 2022-01-03: qty 1

## 2022-01-03 MED ORDER — HEPARIN SODIUM (PORCINE) 1000 UNIT/ML IJ SOLN
INTRAMUSCULAR | Status: DC | PRN
  Administered 2022-01-03: 1000 [IU] via INTRAVENOUS

## 2022-01-03 MED ORDER — PROPOFOL 10 MG/ML IV BOLUS
INTRAVENOUS | Status: DC | PRN
Start: 2022-01-03 — End: 2022-01-03
  Administered 2022-01-03: 120 mg via INTRAVENOUS

## 2022-01-03 MED ORDER — SODIUM CHLORIDE 0.9% FLUSH: 3.0000 mL | Freq: Two times a day (BID) | INTRAVENOUS | Status: DC

## 2022-01-03 MED ORDER — SODIUM CHLORIDE 0.9 % IV SOLN: 250.0000 mL | INTRAVENOUS | Status: DC | PRN

## 2022-01-03 MED ORDER — COLCHICINE 0.6 MG PO TABS: 0.6000 mg | ORAL_TABLET | Freq: Two times a day (BID) | ORAL | 0 refills | Status: DC

## 2022-01-03 MED ORDER — SODIUM CHLORIDE 0.9 % IV SOLN: INTRAVENOUS | Status: DC

## 2022-01-03 MED ORDER — HEPARIN (PORCINE) IN NACL 1000-0.9 UT/500ML-% IV SOLN
INTRAVENOUS | Status: AC
  Filled 2022-01-03: qty 500

## 2022-01-03 MED ORDER — HEPARIN SODIUM (PORCINE) 1000 UNIT/ML IJ SOLN
INTRAMUSCULAR | Status: DC | PRN
  Administered 2022-01-03: 12000 [IU] via INTRAVENOUS
  Administered 2022-01-03: 2000 [IU] via INTRAVENOUS

## 2022-01-03 MED ORDER — ROCURONIUM BROMIDE 10 MG/ML (PF) SYRINGE
PREFILLED_SYRINGE | INTRAVENOUS | Status: DC | PRN
  Administered 2022-01-03: 50 mg via INTRAVENOUS

## 2022-01-03 MED ORDER — ACETAMINOPHEN 325 MG PO TABS
650.0000 mg | ORAL_TABLET | ORAL | Status: DC | PRN
  Administered 2022-01-03: 650 mg via ORAL
  Filled 2022-01-03: qty 2

## 2022-01-03 MED ORDER — ONDANSETRON HCL 4 MG/2ML IJ SOLN: 4.0000 mg | Freq: Four times a day (QID) | INTRAMUSCULAR | Status: DC | PRN

## 2022-01-03 MED ORDER — PHENYLEPHRINE HCL-NACL 20-0.9 MG/250ML-% IV SOLN
INTRAVENOUS | Status: DC | PRN
  Administered 2022-01-03: 20 ug/min via INTRAVENOUS
  Administered 2022-01-03: 60 ug/min via INTRAVENOUS

## 2022-01-03 MED ORDER — HEPARIN SODIUM (PORCINE) 1000 UNIT/ML IJ SOLN
INTRAMUSCULAR | Status: AC
  Filled 2022-01-03: qty 10

## 2022-01-03 MED ORDER — FENTANYL CITRATE (PF) 250 MCG/5ML IJ SOLN
INTRAMUSCULAR | Status: DC | PRN
  Administered 2022-01-03: 50 ug via INTRAVENOUS
  Administered 2022-01-03: 25 ug via INTRAVENOUS

## 2022-01-03 MED ORDER — SUGAMMADEX SODIUM 200 MG/2ML IV SOLN
INTRAVENOUS | Status: DC | PRN
Start: 2022-01-03 — End: 2022-01-03
  Administered 2022-01-03: 200 mg via INTRAVENOUS

## 2022-01-03 MED ORDER — PANTOPRAZOLE SODIUM 40 MG PO TBEC
40.0000 mg | DELAYED_RELEASE_TABLET | Freq: Every day | ORAL | Status: DC
  Administered 2022-01-03: 40 mg via ORAL
  Filled 2022-01-03: qty 1

## 2022-01-03 MED ORDER — SODIUM CHLORIDE 0.9% FLUSH: 3.0000 mL | INTRAVENOUS | Status: DC | PRN

## 2022-01-03 MED ORDER — ONDANSETRON HCL 4 MG/2ML IJ SOLN
INTRAMUSCULAR | Status: DC | PRN
  Administered 2022-01-03: 4 mg via INTRAVENOUS

## 2022-01-03 MED ORDER — PROTAMINE SULFATE 10 MG/ML IV SOLN
INTRAVENOUS | Status: DC | PRN
  Administered 2022-01-03: 30 mg via INTRAVENOUS

## 2022-01-03 MED ORDER — HEPARIN (PORCINE) IN NACL 1000-0.9 UT/500ML-% IV SOLN
INTRAVENOUS | Status: DC | PRN
  Administered 2022-01-03 (×4): 500 mL

## 2022-01-03 SURGICAL SUPPLY — 17 items
CATH 8FR REPROCESSED SOUNDSTAR (CATHETERS) ×2 IMPLANT
CATH 8FR SOUNDSTAR REPROCESSED (CATHETERS) IMPLANT
CATH OCTARAY 2.0 F 3-3-3-3-3 (CATHETERS) ×1 IMPLANT
CATH S CIRCA THERM PROBE 10F (CATHETERS) ×1 IMPLANT
CATH SMTCH THERMOCOOL SF DF (CATHETERS) ×2 IMPLANT
CATH WEBSTER BI DIR CS D-F CRV (CATHETERS) ×1 IMPLANT
CLOSURE PERCLOSE PROSTYLE (VASCULAR PRODUCTS) ×3 IMPLANT
COVER SWIFTLINK CONNECTOR (BAG) ×2 IMPLANT
PACK EP LATEX FREE (CUSTOM PROCEDURE TRAY) ×2
PACK EP LF (CUSTOM PROCEDURE TRAY) ×1 IMPLANT
PAD DEFIB RADIO PHYSIO CONN (PAD) ×2 IMPLANT
PATCH CARTO3 (PAD) ×1 IMPLANT
SHEATH BAYLIS TRANSSEPTAL 98CM (NEEDLE) ×1 IMPLANT
SHEATH CARTO VIZIGO SM CVD (SHEATH) ×1 IMPLANT
SHEATH PINNACLE 8F 10CM (SHEATH) ×2 IMPLANT
SHEATH PINNACLE 9F 10CM (SHEATH) ×1 IMPLANT
TUBING SMART ABLATE COOLFLOW (TUBING) ×1 IMPLANT

## 2022-01-03 NOTE — Transfer of Care (Signed)
Immediate Anesthesia Transfer of Care Note ? ?Patient: Amy Underwood ? ?Procedure(s) Performed: ATRIAL FIBRILLATION ABLATION ? ?Patient Location: Cath Lab ? ?Anesthesia Type:General ? ?Level of Consciousness: awake, alert  and oriented ? ?Airway & Oxygen Therapy: Patient connected to nasal cannula oxygen ? ?Post-op Assessment: Post -op Vital signs reviewed and stable ? ?Post vital signs: stable ? ?Last Vitals:  ?Vitals Value Taken Time  ?BP 173/74 01/03/22 1053  ?Temp    ?Pulse 50 01/03/22 1053  ?Resp 16 01/03/22 1053  ?SpO2 98 % 01/03/22 1053  ?Vitals shown include unvalidated device data. ? ?Last Pain:  ?Vitals:  ? 01/03/22 0553  ?TempSrc:   ?PainSc: 0-No pain  ?   ? ?Patients Stated Pain Goal: 3 (01/03/22 0553) ? ?Complications: No notable events documented. ?

## 2022-01-03 NOTE — Anesthesia Preprocedure Evaluation (Addendum)
Anesthesia Evaluation  ?Patient identified by MRN, date of birth, ID band ?Patient awake ? ? ? ?Reviewed: ?Allergy & Precautions, NPO status , Patient's Chart, lab work & pertinent test results ? ?Airway ?Mallampati: II ? ?TM Distance: >3 FB ?Neck ROM: Full ? ? ? Dental ? ?(+) Teeth Intact, Dental Advisory Given ?  ?Pulmonary ?asthma , former smoker,  ?  ?breath sounds clear to auscultation ? ? ? ? ? ? Cardiovascular ?hypertension, + dysrhythmias Atrial Fibrillation  ?Rhythm:Irregular Rate:Normal ? ?Echo: ?1. Left ventricular ejection fraction, by estimation, is 60 to 65%. Left  ?ventricular ejection fraction by 2D MOD biplane is 63.8 %. The left  ?ventricle has normal function. The left ventricle has no regional wall  ?motion abnormalities. There is moderate  ?left ventricular hypertrophy of the basal-septal segment. Left ventricular  ?diastolic parameters are consistent with Grade II diastolic dysfunction  ?(pseudonormalization). The average left ventricular global longitudinal  ?strain is -17.8 %. The global  ?longitudinal strain is normal.  ??2. Right ventricular systolic function is normal. The right ventricular  ?size is normal. There is normal pulmonary artery systolic pressure.  ??3. Left atrial size was mildly dilated.  ??4. The mitral valve is degenerative. Mild mitral valve regurgitation.  ??5. The aortic valve is calcified. Aortic valve regurgitation is mild.  ?Mild aortic valve stenosis. Aortic valve mean gradient measures 8.0 mmHg.  ??6. The inferior vena cava is normal in size with greater than 50%  ?respiratory variability, suggesting right atrial pressure of 3 mmHg.  ?  ?Neuro/Psych ?negative neurological ROS ? negative psych ROS  ? GI/Hepatic ?Neg liver ROS, GERD  ,  ?Endo/Other  ?negative endocrine ROS ? Renal/GU ?Renal disease  ? ?  ?Musculoskeletal ? ?(+) Arthritis ,  ? Abdominal ?Normal abdominal exam  (+)   ?Peds ? Hematology ?negative hematology ROS ?(+)    ?Anesthesia Other Findings ? ? Reproductive/Obstetrics ? ?  ? ? ? ? ? ? ? ? ? ? ? ? ? ?  ?  ? ? ? ? ? ? ?Anesthesia Physical ?Anesthesia Plan ? ?ASA: 3 ? ?Anesthesia Plan: General  ? ?Post-op Pain Management:   ? ?Induction: Intravenous ? ?PONV Risk Score and Plan: 3 and Ondansetron and Treatment may vary due to age or medical condition ? ?Airway Management Planned: Oral ETT ? ?Additional Equipment: None ? ?Intra-op Plan:  ? ?Post-operative Plan: Extubation in OR ? ?Informed Consent: I have reviewed the patients History and Physical, chart, labs and discussed the procedure including the risks, benefits and alternatives for the proposed anesthesia with the patient or authorized representative who has indicated his/her understanding and acceptance.  ? ? ? ? ? ?Plan Discussed with: CRNA ? ?Anesthesia Plan Comments:   ? ? ? ? ? ?Anesthesia Quick Evaluation ? ?

## 2022-01-03 NOTE — Anesthesia Procedure Notes (Signed)
Procedure Name: Intubation ?Date/Time: 01/03/2022 7:43 AM ?Performed by: Lavell Luster, CRNA ?Pre-anesthesia Checklist: Patient identified, Emergency Drugs available, Suction available, Patient being monitored and Timeout performed ?Patient Re-evaluated:Patient Re-evaluated prior to induction ?Oxygen Delivery Method: Circle system utilized ?Preoxygenation: Pre-oxygenation with 100% oxygen ?Induction Type: IV induction ?Ventilation: Mask ventilation without difficulty ?Laryngoscope Size: Mac and 3 ?Grade View: Grade I ?Tube type: Oral ?Tube size: 7.0 mm ?Number of attempts: 1 ?Airway Equipment and Method: Stylet ?Placement Confirmation: ETT inserted through vocal cords under direct vision and positive ETCO2 ?Secured at: 21 cm ?Tube secured with: Tape ?Dental Injury: Teeth and Oropharynx as per pre-operative assessment  ? ? ? ? ?

## 2022-01-03 NOTE — Anesthesia Postprocedure Evaluation (Signed)
Anesthesia Post Note ? ?Patient: Amy Underwood ? ?Procedure(s) Performed: ATRIAL FIBRILLATION ABLATION ? ?  ? ?Patient location during evaluation: PACU ?Anesthesia Type: General ?Level of consciousness: awake and alert ?Pain management: pain level controlled ?Vital Signs Assessment: post-procedure vital signs reviewed and stable ?Respiratory status: spontaneous breathing, nonlabored ventilation, respiratory function stable and patient connected to nasal cannula oxygen ?Cardiovascular status: blood pressure returned to baseline and stable ?Postop Assessment: no apparent nausea or vomiting ?Anesthetic complications: no ? ? ?No notable events documented. ? ?Last Vitals:  ?Vitals:  ? 01/03/22 1443 01/03/22 1445  ?BP: (!) 155/59 (!) 151/60  ?Pulse: 61 (!) 58  ?Resp:  17  ?Temp:    ?SpO2: 100% 100%  ?  ?Last Pain:  ?Vitals:  ? 01/03/22 1311  ?TempSrc:   ?PainSc: 6   ? ? ?  ?  ?  ?  ?  ?  ? ?Effie Berkshire ? ? ? ? ?

## 2022-01-03 NOTE — Discharge Instructions (Signed)

## 2022-01-03 NOTE — H&P (Signed)
?Electrophysiology Office Follow up Visit Note:   ?  ?Date:  01/03/2022  ?  ?ID:  Amy Underwood, DOB Jan 15, 1946, MRN [id number] ?  ?PCP:  Pleas Koch, NP  ?  ?[facility] Electrophysiologist:  Vickie Epley, MD  ?  ?  ?Interval History:   ?  ?Amy Underwood is a 76 y.o. female who presents for a follow up visit. They were last seen in clinic 07/06/2021 for AF. She is maintained on dofetilide 150mcg (dose reduced 2/2 renal dysfunction). She was previously cared for by Dr Rosezella [place] in [place]. Ablation was discussed at our last appointment. She wanted some time to think about her options before finalizing a treatment plan. ?She called back to schedule an ablation and is currently scheduled for Jan 03, 2022. She is on coumadin but is interested in changing anticoagulants to xarelto.  She has had a lot of trouble keeping her INR is in range and she is very motivated to get off anticoagulants long-term.  She tells me that it really impacts her lifestyle.  She cannot afford NOAC.  She continues to have breakthrough episodes of atrial fibrillation despite treatment with dofetilide. ?  ?Patient presents for PVI today. Procedure reviewed. ?  ?  ?Objective  ?  ?  ?    ?Past Medical History:  ?Diagnosis Date  ? Adhesive capsulitis of right shoulder    ? Asthma    ? Hyperlipidemia    ? Hypertension    ? Iron deficiency anemia    ? Osteoarthritis    ? Paroxysmal atrial fibrillation (HCC)    ? Pernicious anemia    ? Psoriasis    ?  ?  ?     ?Past Surgical History:  ?Procedure Laterality Date  ? CESAREAN SECTION      ?  3  ? CHOLECYSTECTOMY      ? HIP ARTHROPLASTY Left    ? KNEE ARTHROPLASTY Bilateral    ?  ?  ?Current Medications: ?Active Medications  ?    ?Current Meds  ?Medication Sig  ? albuterol (VENTOLIN HFA) 108 (90 Base) MCG/ACT inhaler Inhale 1-2 puffs into the lungs every 6 (six) hours as needed for wheezing or shortness of breath.  ? atorvastatin (LIPITOR) 20 MG tablet atorvastatin 20 mg tablet ? TAKE 1 TABLET  BY MOUTH  DAILY  ? calcipotriene (DOVONOX) 0.005 % cream calcipotriene 0.005 % topical cream  ? Calcipotriene-Betameth Diprop (WYNZORA) 0.005-0.064 % CREA Apply to affected skin once a day  ? Cholecalciferol 1.25 MG (50000 UT) capsule cholecalciferol (vitamin D3) 1,250 mcg (50,000 unit) capsule ? Take 1 capsule every week by oral route.  ? clobetasol cream (TEMOVATE) 0.05 % clobetasol 0.05 % topical cream ? APPLY TO AFFECTED AREA OF BODY TWICE A  DAY FOR 2 WEEKS OUT OF THE MONTH AS NEEDED FOR FLARES EXCLUDE FACE AND GENITAL  ? cyanocobalamin 1000 MCG tablet Vitamin B-12  1,000 mcg tablet ? Take 1 tablet every day by oral route.  ? diltiazem (CARDIZEM CD) 240 MG 24 hr capsule Take 240 mg by mouth daily.  ? dofetilide (TIKOSYN) 125 MCG capsule dofetilide 125 mcg capsule  ? FARXIGA 10 MG TABS tablet Take 10 mg by mouth every morning.  ? ferrous sulfate 325 (65 FE) MG tablet every 14 (fourteen) days.  ? furosemide (LASIX) 20 MG tablet Take by mouth as needed.  ? losartan (COZAAR) 100 MG tablet losartan 100 mg tablet  ? magnesium oxide (MAG-OX) 400 MG tablet magnesium 400 mg (as  magnesium oxide) tablet ? Take 1 tablet twice a day by oral route.  ? Multiple Vitamin (MULTI-VITAMIN) tablet Take 1 tablet by mouth every morning.  ? niacin 500 MG CR capsule niacin ? 500 mg daily  ? pantoprazole (PROTONIX) 40 MG tablet pantoprazole 40 mg tablet,delayed release  ? warfarin (COUMADIN) 5 MG tablet Take 1 tablet (5 mg total) by mouth daily.  ?  ?  ?  ?Allergies:   Cefaclor  ?  ?Social History  ?  ?     ?Socioeconomic History  ? Marital status: Widowed  ?    Spouse name: Not on file  ? Number of children: Not on file  ? Years of education: Not on file  ? Highest education level: Not on file  ?Occupational History  ? Not on file  ?Tobacco Use  ? Smoking status: Former  ?    Types: Cigarettes  ?    Start date: 2001  ? Smokeless tobacco: Never  ?Vaping Use  ? Vaping Use: Never used  ?Substance and Sexual Activity  ? Alcohol use: Not on  file  ? Drug use: Never  ? Sexual activity: Not Currently  ?Other Topics Concern  ? Not on file  ?Social History Narrative  ? Not on file  ?  ?Social Determinants of Health  ?  ?Financial Resource Strain: Not on file  ?Food Insecurity: Not on file  ?Transportation Needs: Not on file  ?Physical Activity: Not on file  ?Stress: Not on file  ?Social Connections: Not on file  ?  ?  ?Family History: ?The patient's family history includes Heart disease in her mother. ?  ?ROS:   ?Please see the history of present illness.    ?All other systems reviewed and are negative. ?  ?EKGs/Labs/Other Studies Reviewed:   ?  ?The following studies were reviewed today: ?  ?  ?EKG:  The ekg ordered today demonstrates sinus rhythm.  QTc 460 ms. ?  ?Recent Labs: ?05/25/2021: ALT 20; Hemoglobin 12.7; Platelets 160.0 ?06/02/2021: BUN 45; Creatinine, Ser 1.84; Potassium 5.2 No hemolysis seen; Sodium 139  ?Recent Lipid Panel ?Labs (Brief)  ?     ?   ?Component Value Date/Time  ?  CHOL 154 05/25/2021 1047  ?  TRIG 133.0 05/25/2021 1047  ?  HDL 60.60 05/25/2021 1047  ?  CHOLHDL 3 05/25/2021 1047  ?  VLDL 26.6 05/25/2021 1047  ?  [facility] 66 05/25/2021 1047  ?  ?  ?  ?Physical Exam:   ?  ?VS:  BP 151/92 (BP Location: Left Arm, Patient Position: Sitting, Cuff Size: Large)   Pulse 95  Ht 5\' 3"  (1.6 m)   Wt 186 lb (84.4 kg)   SpO2 98%   BMI 32.95 kg/m?    ?  ?   ?Wt Readings from Last 3 Encounters:  ?11/30/21 186 lb (84.4 kg)  ?10/25/21 189 lb 3.2 oz (85.8 kg)  ?10/19/21 189 lb (85.7 kg)  ?  ?  ?GEN:  Well nourished, well developed in no acute distress ?HEENT: Normal ?NECK: No JVD; No carotid bruits ?LYMPHATICS: No lymphadenopathy ?CARDIAC: RRR, no murmurs, rubs, gallops ?RESPIRATORY:  Clear to auscultation without rales, wheezing or rhonchi  ?ABDOMEN: Soft, non-tender, non-distended ?MUSCULOSKELETAL:  No edema; No deformity  ?SKIN: Warm and dry ?NEUROLOGIC:  Alert and oriented x 3 ?PSYCHIATRIC:  Normal affect  ?  ?  ?  ?  ?  ?Assessment ?  ?   ?ASSESSMENT:   ?  ?1. Paroxysmal atrial fibrillation (  [place])   ?2. Essential hypertension   ?3. Encounter for long-term (current) use of high-risk medication   ?  ?PLAN:   ?  ?In order of problems listed above: ?  ?#Persistent atrial fibrillation ?Improved control on Tikosyn but still having breakthrough episodes.  The patient would like to proceed with ablation.  This is already scheduled for her.  For now, continue Coumadin, dofetilide, diltiazem. ?  ?Discussed treatment options today for his AF including antiarrhythmic drug therapy and ablation. Discussed risks, recovery and likelihood of success. Discussed potential need for repeat ablation procedures and antiarrhythmic drugs after an initial ablation. They wish to proceed with scheduling. ? ?Risk, benefits, and alternatives to EP study and radiofrequency ablation for afib were also discussed in detail today. These risks include but are not limited to stroke, bleeding, vascular damage, tamponade, perforation, damage to the esophagus, lungs, and other structures, pulmonary vein stenosis, worsening renal function, and death. The patient understands these risk and wishes to proceed.  We will therefore proceed with catheter ablation at the next available time.  Carto, ICE, anesthesia are requested for the procedure.   ? ?  ?  ? ?Lars Mage, MD, [facility], [place] ?01/03/2022 ?Electrophysiology ?[place] ?  ?  ?

## 2022-01-04 ENCOUNTER — Telehealth: Payer: Self-pay | Admitting: Cardiology

## 2022-01-04 ENCOUNTER — Encounter (HOSPITAL_COMMUNITY): Payer: Self-pay | Admitting: Cardiology

## 2022-01-04 LAB — POCT ACTIVATED CLOTTING TIME
Activated Clotting Time: 341 seconds
Activated Clotting Time: 359 seconds
Activated Clotting Time: 323 seconds
Activated Clotting Time: 353 seconds

## 2022-01-04 MED ORDER — DOFETILIDE 125 MCG PO CAPS: 125.0000 ug | ORAL_CAPSULE | Freq: Two times a day (BID) | ORAL | 3 refills | Status: DC

## 2022-01-04 NOTE — Telephone Encounter (Signed)
?*  STAT* If patient is at the pharmacy, call can be transferred to refill team. ? ? ?1. Which medications need to be refilled? (please list name of each medication and dose if known)  ? dofetilide (TIKOSYN) 125 MCG capsule  ? ?2. Which pharmacy/location (including street and city if local pharmacy) is medication to be sent to? [id number] - [place], [place] ? ?3. Do they need a 30 day or 90 day supply?  ?90 day ? ?

## 2022-01-04 NOTE — Telephone Encounter (Signed)
Please advise if ok to refill last filled by Historical provider. ?

## 2022-01-06 ENCOUNTER — Telehealth: Payer: Self-pay | Admitting: Cardiology

## 2022-01-06 MED ORDER — AMLODIPINE BESYLATE 5 MG PO TABS: 5.0000 mg | ORAL_TABLET | Freq: Every day | ORAL | 3 refills | Status: DC

## 2022-01-06 NOTE — Telephone Encounter (Signed)
Spoke to Oda Kilts, [place] that was in the hospital with Dr. Quentin Ore on procedure day. Will start Amlodipine 5 mg daily per Jonni Sanger. Gave ED precautions and on-call precautions for the weekend. Patient will have sister go pick up medication.  ?Verbalized understanding and agreement.  ?

## 2022-01-06 NOTE — Telephone Encounter (Signed)
Pt c/o BP issue: STAT if pt c/o blurred vision, one-sided weakness or slurred speech ? ?1. What are your last 5 BP readings? 212/158; 177/132; 136/93; 149/97; 139/98 ? ?2. Are you having any other symptoms (ex. Dizziness, headache, blurred vision, passed out)? Bad headache,  ? ?3. What is your BP issue? Patient had procedure done on 5/2 and has really high BP ?

## 2022-01-10 ENCOUNTER — Encounter: Payer: Self-pay | Admitting: Cardiology

## 2022-01-10 ENCOUNTER — Telehealth: Payer: Self-pay | Admitting: Cardiology

## 2022-01-10 MED ORDER — AMLODIPINE BESYLATE 10 MG PO TABS: 10.0000 mg | ORAL_TABLET | Freq: Every day | ORAL | 3 refills | Status: DC

## 2022-01-10 NOTE — Telephone Encounter (Signed)
Pt c/o medication issue: ? ?1. Name of Medication: amLODipine (NORVASC) 5 MG tablet ? ?2. How are you currently taking this medication (dosage and times per day)? 1 tablet daily ? ?3. Are you having a reaction (difficulty breathing--STAT)? no ? ?4. What is your medication issue? Helene Kelp from [facility] [employer] they have on file that the patient takes amlodipine, but was getting a prescription for diltiazem from another office. She is calling to verify whether the patient is supposed to be on both medications, because they are in the same family and patient's typically do not take both. Phone: [phone] ?

## 2022-01-10 NOTE — Telephone Encounter (Signed)
Notified Diltiazem was stopped at last hospital visit 01/03/22.  ?Helene Kelp verbalized understanding, will discontinue in there system.  ?

## 2022-01-11 ENCOUNTER — Ambulatory Visit (INDEPENDENT_AMBULATORY_CARE_PROVIDER_SITE_OTHER): Payer: Medicare Other

## 2022-01-11 DIAGNOSIS — Z5181 Encounter for therapeutic drug level monitoring: Secondary | ICD-10-CM

## 2022-01-11 DIAGNOSIS — I480 Paroxysmal atrial fibrillation: Secondary | ICD-10-CM | POA: Diagnosis not present

## 2022-01-11 DIAGNOSIS — Z7901 Long term (current) use of anticoagulants: Secondary | ICD-10-CM | POA: Diagnosis not present

## 2022-01-11 LAB — POCT INR: INR: 2.5 (ref 2.0–3.0)

## 2022-01-11 NOTE — Patient Instructions (Signed)
continue warfarin dosage of 1 tablet (5 mg) every day EXCEPT 1/2 TABLET ON MONDAYS AND FRIDAYS ?- Recheck INR in 6 weeks ?Continue to have your greens on the same day (or days) each week ?Cou[facility]in Clinic [phone] [facility]);   ?

## 2022-01-25 ENCOUNTER — Ambulatory Visit: Payer: Medicare Other | Admitting: Dermatology

## 2022-01-26 ENCOUNTER — Other Ambulatory Visit: Payer: Self-pay

## 2022-01-26 DIAGNOSIS — I4891 Unspecified atrial fibrillation: Secondary | ICD-10-CM

## 2022-01-31 ENCOUNTER — Telehealth: Payer: Self-pay | Admitting: *Deleted

## 2022-01-31 ENCOUNTER — Ambulatory Visit (HOSPITAL_COMMUNITY)
Admission: RE | Admit: 2022-01-31 | Discharge: 2022-01-31 | Disposition: A | Discharge status: Home / Self Care | Payer: Medicare Other | Source: Ambulatory Visit | Attending: Physician Assistant | Admitting: Physician Assistant

## 2022-01-31 VITALS — BP 168/90 | HR 97 | Ht 63.0 in | Wt 184.4 lb

## 2022-01-31 DIAGNOSIS — I10 Essential (primary) hypertension: Secondary | ICD-10-CM | POA: Insufficient documentation

## 2022-01-31 DIAGNOSIS — D6869 Other thrombophilia: Secondary | ICD-10-CM | POA: Diagnosis not present

## 2022-01-31 DIAGNOSIS — Z7901 Long term (current) use of anticoagulants: Secondary | ICD-10-CM | POA: Insufficient documentation

## 2022-01-31 DIAGNOSIS — E669 Obesity, unspecified: Secondary | ICD-10-CM | POA: Diagnosis not present

## 2022-01-31 DIAGNOSIS — Z713 Dietary counseling and surveillance: Secondary | ICD-10-CM | POA: Diagnosis not present

## 2022-01-31 DIAGNOSIS — E785 Hyperlipidemia, unspecified: Secondary | ICD-10-CM | POA: Insufficient documentation

## 2022-01-31 DIAGNOSIS — Z6832 Body mass index (BMI) 32.0-32.9, adult: Secondary | ICD-10-CM | POA: Diagnosis not present

## 2022-01-31 DIAGNOSIS — Z79899 Other long term (current) drug therapy: Secondary | ICD-10-CM | POA: Diagnosis not present

## 2022-01-31 DIAGNOSIS — Z7182 Exercise counseling: Secondary | ICD-10-CM | POA: Diagnosis not present

## 2022-01-31 DIAGNOSIS — I480 Paroxysmal atrial fibrillation: Secondary | ICD-10-CM | POA: Diagnosis not present

## 2022-01-31 DIAGNOSIS — L409 Psoriasis, unspecified: Secondary | ICD-10-CM | POA: Insufficient documentation

## 2022-01-31 NOTE — Progress Notes (Signed)
Primary Care Physician: Pleas Koch, NP Primary Electrophysiologist: Dr Quentin Ore Referring Physician: Dr Quentin Ore   Amy Underwood is a 76 y.o. female with a history of HTN, HLD, psoriasis, atrial fibrillation who presents for follow up in the [facility].  The patient was initially diagnosed with atrial fibrillation remotely and has been maintained on dofetilide. She was seen by Dr Quentin Ore and was having paroxysms of afib. She underwent afib and flutter ablation 01/03/22. Her CCB was held post ablation for a junctional rhythm. Patient is on warfarin for a CHADS2VASC score of 3.  On follow up today, patient reports she has done well since her ablation. She has had several afib episodes but she reports these have been much shorter in duration than prior to her ablation. She did note her heart rate being elevated today in the 90s bpm. Her heart rate is normally in the 60s bpm in SR. No other associated symptoms. She is also undergoing workup for Watchman implant which is scheduled for 04/20/22.   Today, she denies symptoms of chest pain, shortness of breath, orthopnea, PND, lower extremity edema, dizziness, presyncope, syncope, snoring, daytime somnolence, bleeding, or neurologic sequela. The patient is tolerating medications without difficulties and is otherwise without complaint today.    Atrial Fibrillation Risk Factors:  she does not have symptoms or diagnosis of sleep apnea. she does not have a history of rheumatic fever.   she has a BMI of Body mass index is 32.66 kg/m.Marland Kitchen Filed Weights   01/31/22 1316  Weight: 83.6 kg    Family History  Problem Relation Age of Onset   Heart disease Mother      Atrial Fibrillation Management history:  Previous antiarrhythmic drugs: dofetilide  Previous cardioversions: none Previous ablations: 01/03/22 CHADS2VASC score: 3 Anticoagulation history: warfarin   Past Medical History:  Diagnosis Date   Adhesive  capsulitis of right shoulder    Asthma    Hyperlipidemia    Hypertension    Iron deficiency anemia    Osteoarthritis    Paroxysmal atrial fibrillation (HCC)    Pernicious anemia    Psoriasis    Past Surgical History:  Procedure Laterality Date   ATRIAL FIBRILLATION ABLATION N/A 01/03/2022   Procedure: ATRIAL FIBRILLATION ABLATION;  Surgeon: Vickie Epley, MD;  Location: [facility];  Service: Cardiovascular;  Laterality: N/A;   CESAREAN SECTION     3   CHOLECYSTECTOMY     HIP ARTHROPLASTY Left    KNEE ARTHROPLASTY Bilateral     Current Outpatient Medications  Medication Sig Dispense Refill   albuterol (VENTOLIN HFA) 108 (90 Base) MCG/ACT inhaler Inhale 1-2 puffs into the lungs every 6 (six) hours as needed for wheezing or shortness of breath. 6.7 g 0   allopurinol (ZYLOPRIM) 100 MG tablet Take 100 mg by mouth daily.     amLODipine (NORVASC) 10 MG tablet Take 1 tablet (10 mg total) by mouth daily. 180 tablet 3   atorvastatin (LIPITOR) 20 MG tablet Take 20 mg by mouth daily.     calcipotriene (DOVONOX) 0.005 % cream Apply 1 application. topically 2 (two) times daily as needed (psoriasis).     Cholecalciferol (DIALYVITE VITAMIN D 5000) 125 MCG (5000 UT) capsule Take 5,000 Units by mouth daily.     cyanocobalamin 1000 MCG tablet Take 1,000 mcg by mouth daily.     dofetilide (TIKOSYN) 125 MCG capsule Take 1 capsule (125 mcg total) by mouth 2 (two) times daily. 180 capsule 3  FARXIGA 10 MG TABS tablet Take 10 mg by mouth every morning.     ferrous sulfate 325 (65 FE) MG tablet Take 650 mg by mouth every 14 (fourteen) days.     losartan (COZAAR) 100 MG tablet Take 100 mg by mouth daily.     magnesium oxide (MAG-OX) 400 MG tablet Take 400 mg by mouth daily.     pantoprazole (PROTONIX) 40 MG tablet Take 1 tablet by mouth daily for heartburn. 90 tablet 1   warfarin (COUMADIN) 5 MG tablet Take 1 tablet (5 mg total) by mouth daily. (Patient taking differently: Take 2.5-5 mg by  mouth See admin instructions. Skip dose on 4/26, 2.5 mg on 4/27 then continue taking 5 mg daily except 2.5 mg on Mondays and Fridays) 30 tablet 11   Calcipotriene-Betameth Diprop [facility]) 0.005-0.064 % CREA Apply to affected skin once a day (Patient not taking: Reported on 01/31/2022) 60 g 1   No current facility-administered medications for this encounter.    Allergies  Allergen Reactions   Cefaclor Hives and Rash    Social History   Socioeconomic History   Marital status: Widowed    Spouse name: Not on file   Number of children: Not on file   Years of education: Not on file   Highest education level: Not on file  Occupational History   Not on file  Tobacco Use   Smoking status: Former    Types: Cigarettes    Start date: 2001   Smokeless tobacco: Never  Vaping Use   Vaping Use: Never used  Substance and Sexual Activity   Alcohol use: Not on file   Drug use: Never   Sexual activity: Not Currently  Other Topics Concern   Not on file  Social History Narrative   Not on file   Social Determinants of Health   Financial Resource Strain: Not on file  Food Insecurity: Not on file  Transportation Needs: Not on file  Physical Activity: Not on file  Stress: Not on file  Social Connections: Not on file  Intimate Partner Violence: Not on file     ROS- All systems are reviewed and negative except as per the HPI above.  Physical Exam: Vitals:   01/31/22 1316  BP: (!) 168/90  Pulse: 97  Weight: 83.6 kg  Height: 5\' 3"  (1.6 m)    GEN- The patient is a well appearing elderly obese female, alert and oriented x 3 today.   Head- normocephalic, atraumatic Eyes-  Sclera clear, conjunctiva pink Ears- hearing intact Oropharynx- clear Neck- supple  Lungs- Clear to ausculation bilaterally, normal work of breathing Heart- Regular rate and rhythm, no murmurs, rubs or gallops  GI- soft, NT, ND, + BS Extremities- no clubbing, cyanosis, or edema MS- no significant deformity or  atrophy Skin- no rash or lesion Psych- euthymic mood, full affect Neuro- strength and sensation are intact  Wt Readings from Last 3 Encounters:  01/31/22 83.6 kg  01/03/22 82.6 kg  12/14/21 83.9 kg    EKG today demonstrates  Atypical atrial flutter with 2:1 block Vent. rate 97 BPM PR interval * ms QRS duration 70 ms QT/QTcB 354/449 ms  Echo 10/27/21 demonstrated   1. Left ventricular ejection fraction, by estimation, is 60 to 65%. Left  ventricular ejection fraction by 2D MOD biplane is 63.8 %. The left  ventricle has normal function. The left ventricle has no regional wall  motion abnormalities. There is moderate left ventricular hypertrophy of the basal-septal segment. Left ventricular diastolic  parameters are consistent with Grade II diastolic dysfunction (pseudonormalization). The average left ventricular global longitudinal strain is -17.8 %. The global longitudinal strain is normal.   2. Right ventricular systolic function is normal. The right ventricular  size is normal. There is normal pulmonary artery systolic pressure.   3. Left atrial size was mildly dilated.   4. The mitral valve is degenerative. Mild mitral valve regurgitation.   5. The aortic valve is calcified. Aortic valve regurgitation is mild.  Mild aortic valve stenosis. Aortic valve mean gradient measures 8.0 mmHg.   6. The inferior vena cava is normal in size with greater than 50%  respiratory variability, suggesting right atrial pressure of 3 mmHg.   Epic records are reviewed at length today   HAS-BLED score 3 Hypertension Yes  Abnormal renal and liver function (Dialysis, transplant, Cr >2.26 mg/dL /Cirrhosis or Bilirubin >2x Normal or AST/ALT/AP >3x Normal) No  Stroke No  Bleeding No  Labile INR (Unstable/high INR) Yes  Elderly (>65) Yes  Drugs or alcohol (? 8 drinks/week, anti-plt or NSAID) No    CHA2DS2-VASc Score = 3  The patient's score is based upon: CHF History: 0 HTN History: 1 Diabetes  History: 0 Stroke History: 0 Vascular Disease History: 0 Age Score: 1 Gender Score: 1       ASSESSMENT AND PLAN: 1. Paroxysmal Atrial Fibrillation/atrial flutter The patient's CHA2DS2-VASc score is 3, indicating a 3.2% annual risk of stroke.   S/p afib and flutter ablation 01/03/22 Patient in atypical flutter today, unclear duration. Will plan for DCCV, she will need 4 weekly INRs. We discussed if she flips to SR we will cancel the DCCV. We reviewed how to check rhythm on Apple Watch. Encouraged her to check regularly.  Continue dofetilide 125 mcg BID. QT stable. Continue warfarin  2. Secondary Hypercoagulable State (ICD10:  D68.69) The patient is at significant risk for stroke/thromboembolism based upon her CHA2DS2-VASc Score of 3.  Continue Warfarin (Coumadin). Scheduled for watchman implant 04/20/22.  3. Obesity Body mass index is 32.66 kg/m. Lifestyle modification was discussed at length including regular exercise and weight reduction.  4. HTN Elevated today, she reports it has been well controlled at home since starting amlodipine. Will not make changes today.    Follow up in the AF clinic in two weeks.    [facility] [address] [place], [place] [address] [phone] 01/31/2022 1:37 PM

## 2022-01-31 NOTE — Telephone Encounter (Signed)
Message routed to [employer] to make appts.

## 2022-01-31 NOTE — Telephone Encounter (Signed)
-----   Message from Juluis Mire, RN sent at 01/31/2022  2:03 PM EDT ----- Regarding: weekly inrs Pt needs to start weekly INRs in preparation for cardioversion after 4 therapeutic INRS. Thanks! [facility]

## 2022-02-07 ENCOUNTER — Other Ambulatory Visit: Payer: Self-pay

## 2022-02-07 MED ORDER — DOFETILIDE 125 MCG PO CAPS
125.0000 ug | ORAL_CAPSULE | Freq: Two times a day (BID) | ORAL | 3 refills | Status: DC
Start: 2022-02-07 — End: 2022-12-26

## 2022-02-14 ENCOUNTER — Ambulatory Visit (HOSPITAL_COMMUNITY)
Admission: RE | Admit: 2022-02-14 | Discharge: 2022-02-14 | Disposition: A | Discharge status: Home / Self Care | Payer: Medicare Other | Source: Ambulatory Visit | Attending: Physician Assistant | Admitting: Physician Assistant

## 2022-02-14 VITALS — BP 138/60 | HR 60 | Ht 63.0 in | Wt 183.2 lb

## 2022-02-14 DIAGNOSIS — I480 Paroxysmal atrial fibrillation: Secondary | ICD-10-CM | POA: Diagnosis present

## 2022-02-14 DIAGNOSIS — E785 Hyperlipidemia, unspecified: Secondary | ICD-10-CM | POA: Diagnosis not present

## 2022-02-14 DIAGNOSIS — L409 Psoriasis, unspecified: Secondary | ICD-10-CM | POA: Diagnosis not present

## 2022-02-14 DIAGNOSIS — E669 Obesity, unspecified: Secondary | ICD-10-CM | POA: Insufficient documentation

## 2022-02-14 DIAGNOSIS — Z6832 Body mass index (BMI) 32.0-32.9, adult: Secondary | ICD-10-CM | POA: Insufficient documentation

## 2022-02-14 DIAGNOSIS — Z7901 Long term (current) use of anticoagulants: Secondary | ICD-10-CM | POA: Diagnosis not present

## 2022-02-14 DIAGNOSIS — D6869 Other thrombophilia: Secondary | ICD-10-CM | POA: Diagnosis not present

## 2022-02-14 DIAGNOSIS — I10 Essential (primary) hypertension: Secondary | ICD-10-CM | POA: Diagnosis not present

## 2022-02-14 DIAGNOSIS — I4892 Unspecified atrial flutter: Secondary | ICD-10-CM | POA: Diagnosis not present

## 2022-02-14 NOTE — Progress Notes (Signed)
Primary Care Physician: Pleas Koch, NP Primary Electrophysiologist: Dr Quentin Ore Referring Physician: Dr Quentin Ore   Amy Underwood is a 76 y.o. female with a history of HTN, HLD, psoriasis, atrial fibrillation who presents for follow up in the [facility].  The patient was initially diagnosed with atrial fibrillation remotely and has been maintained on dofetilide. She was seen by Dr Quentin Ore and was having paroxysms of afib. She underwent afib and flutter ablation 01/03/22. Her CCB was held post ablation for a junctional rhythm. Patient is on warfarin for a CHADS2VASC score of 3. Patient had several afib episodes post ablation but she reports these have been much shorter in duration than prior to her ablation. She was found to be in atrial flutter at her visit 01/31/22. No associated symptoms. She is also undergoing workup for Watchman implant which is scheduled for 04/20/22.   On follow up today, patient reports that she feels better since her last visit. She converted back to SR and did feel an improvement in her energy. Her smart watch has shown heart rates in 60s-70s. No bleeding issues on anticoagulation.   Today, she denies symptoms of palpitations, chest pain, shortness of breath, orthopnea, PND, lower extremity edema, dizziness, presyncope, syncope, snoring, daytime somnolence, bleeding, or neurologic sequela. The patient is tolerating medications without difficulties and is otherwise without complaint today.    Atrial Fibrillation Risk Factors:  she does not have symptoms or diagnosis of sleep apnea. she does not have a history of rheumatic fever.   she has a BMI of Body mass index is 32.45 kg/m.Marland Kitchen Filed Weights   02/14/22 1335  Weight: 83.1 kg     Family History  Problem Relation Age of Onset   Heart disease Mother      Atrial Fibrillation Management history:  Previous antiarrhythmic drugs: dofetilide  Previous cardioversions: none Previous  ablations: 01/03/22 CHADS2VASC score: 3 Anticoagulation history: warfarin   Past Medical History:  Diagnosis Date   Adhesive capsulitis of right shoulder    Asthma    Hyperlipidemia    Hypertension    Iron deficiency anemia    Osteoarthritis    Paroxysmal atrial fibrillation (HCC)    Pernicious anemia    Psoriasis    Past Surgical History:  Procedure Laterality Date   ATRIAL FIBRILLATION ABLATION N/A 01/03/2022   Procedure: ATRIAL FIBRILLATION ABLATION;  Surgeon: Vickie Epley, MD;  Location: [facility];  Service: Cardiovascular;  Laterality: N/A;   CESAREAN SECTION     3   CHOLECYSTECTOMY     HIP ARTHROPLASTY Left    KNEE ARTHROPLASTY Bilateral     Current Outpatient Medications  Medication Sig Dispense Refill   albuterol (VENTOLIN HFA) 108 (90 Base) MCG/ACT inhaler Inhale 1-2 puffs into the lungs every 6 (six) hours as needed for wheezing or shortness of breath. 6.7 g 0   allopurinol (ZYLOPRIM) 100 MG tablet Take 100 mg by mouth daily.     amLODipine (NORVASC) 10 MG tablet Take 1 tablet (10 mg total) by mouth daily. 180 tablet 3   atorvastatin (LIPITOR) 20 MG tablet Take 20 mg by mouth daily.     calcipotriene (DOVONOX) 0.005 % cream Apply 1 application. topically 2 (two) times daily as needed (psoriasis).     Calcipotriene-Betameth Diprop [facility]) 0.005-0.064 % CREA Apply to affected skin once a day 60 g 1   Cholecalciferol (DIALYVITE VITAMIN D 5000) 125 MCG (5000 UT) capsule Take 5,000 Units by mouth daily.  cyanocobalamin 1000 MCG tablet Take 1,000 mcg by mouth daily.     dofetilide (TIKOSYN) 125 MCG capsule Take 1 capsule (125 mcg total) by mouth 2 (two) times daily. 180 capsule 3   FARXIGA 10 MG TABS tablet Take 10 mg by mouth every morning.     ferrous sulfate 325 (65 FE) MG tablet Take 650 mg by mouth every 14 (fourteen) days.     losartan (COZAAR) 100 MG tablet Take 100 mg by mouth daily.     magnesium oxide (MAG-OX) 400 MG tablet Take 400 mg by mouth  daily.     pantoprazole (PROTONIX) 40 MG tablet Take 1 tablet by mouth daily for heartburn. 90 tablet 1   warfarin (COUMADIN) 5 MG tablet Take 1 tablet (5 mg total) by mouth daily. (Patient taking differently: Take 2.5-5 mg by mouth See admin instructions. Skip dose on 4/26, 2.5 mg on 4/27 then continue taking 5 mg daily except 2.5 mg on Mondays and Fridays) 30 tablet 11   No current facility-administered medications for this encounter.    Allergies  Allergen Reactions   Cefaclor Hives and Rash    Social History   Socioeconomic History   Marital status: Widowed    Spouse name: Not on file   Number of children: Not on file   Years of education: Not on file   Highest education level: Not on file  Occupational History   Not on file  Tobacco Use   Smoking status: Former    Types: Cigarettes    Start date: 2001   Smokeless tobacco: Never  Vaping Use   Vaping Use: Never used  Substance and Sexual Activity   Alcohol use: Not on file   Drug use: Never   Sexual activity: Not Currently  Other Topics Concern   Not on file  Social History Narrative   Not on file   Social Determinants of Health   Financial Resource Strain: Not on file  Food Insecurity: Not on file  Transportation Needs: Not on file  Physical Activity: Not on file  Stress: Not on file  Social Connections: Not on file  Intimate Partner Violence: Not on file     ROS- All systems are reviewed and negative except as per the HPI above.  Physical Exam: Vitals:   02/14/22 1335  BP: 138/60  Pulse: 60  Weight: 83.1 kg  Height: 5\' 3"  (1.6 m)     GEN- The patient is a well appearing elderly obese female, alert and oriented x 3 today.   HEENT-head normocephalic, atraumatic, sclera clear, conjunctiva pink, hearing intact, trachea midline. Lungs- Clear to ausculation bilaterally, normal work of breathing Heart- Regular rate and rhythm, no murmurs, rubs or gallops  GI- soft, NT, ND, + BS Extremities- no  clubbing, cyanosis, or edema MS- no significant deformity or atrophy Skin- no rash or lesion Psych- euthymic mood, full affect Neuro- strength and sensation are intact   Wt Readings from Last 3 Encounters:  02/14/22 83.1 kg  01/31/22 83.6 kg  01/03/22 82.6 kg    EKG today demonstrates  Junctional rhythm vs ectopic rhythm with short PR Vent. rate 60 BPM PR interval * ms QRS duration 70 ms QT/QTcB 446/446 ms  Echo 10/27/21 demonstrated   1. Left ventricular ejection fraction, by estimation, is 60 to 65%. Left  ventricular ejection fraction by 2D MOD biplane is 63.8 %. The left  ventricle has normal function. The left ventricle has no regional wall  motion abnormalities. There is moderate left  ventricular hypertrophy of the basal-septal segment. Left ventricular diastolic parameters are consistent with Grade II diastolic dysfunction (pseudonormalization). The average left ventricular global longitudinal strain is -17.8 %. The global longitudinal strain is normal.   2. Right ventricular systolic function is normal. The right ventricular  size is normal. There is normal pulmonary artery systolic pressure.   3. Left atrial size was mildly dilated.   4. The mitral valve is degenerative. Mild mitral valve regurgitation.   5. The aortic valve is calcified. Aortic valve regurgitation is mild.  Mild aortic valve stenosis. Aortic valve mean gradient measures 8.0 mmHg.   6. The inferior vena cava is normal in size with greater than 50%  respiratory variability, suggesting right atrial pressure of 3 mmHg.   Epic records are reviewed at length today   HAS-BLED score 3 Hypertension Yes  Abnormal renal and liver function (Dialysis, transplant, Cr >2.26 mg/dL /Cirrhosis or Bilirubin >2x Normal or AST/ALT/AP >3x Normal) No  Stroke No  Bleeding No  Labile INR (Unstable/high INR) Yes  Elderly (>65) Yes  Drugs or alcohol (? 8 drinks/week, anti-plt or NSAID) No    CHA2DS2-VASc Score = 3  The  patient's score is based upon: CHF History: 0 HTN History: 1 Diabetes History: 0 Stroke History: 0 Vascular Disease History: 0 Age Score: 1 Gender Score: 1       ASSESSMENT AND PLAN: 1. Paroxysmal Atrial Fibrillation/atrial flutter The patient's CHA2DS2-VASc score is 3, indicating a 3.2% annual risk of stroke.   S/p afib and flutter ablation 01/03/22 Patient no longer in flutter, will not pursue DCCV at this time.  Continue dofetilide 125 mcg BID. QT stable. Continue warfarin Apple Watch for home monitoring.   2. Secondary Hypercoagulable State (ICD10:  D68.69) The patient is at significant risk for stroke/thromboembolism based upon her CHA2DS2-VASc Score of 3.  Continue Warfarin (Coumadin). Scheduled for watchman implant 04/20/22.  3. Obesity Body mass index is 32.45 kg/m. Lifestyle modification was discussed and encouraged including regular physical activity and weight reduction.  4. HTN Stable, no changes today.   Follow up with Dr Gasper Sells and for [facility] implant as scheduled.    [facility] [address] [place], [place] [address] [phone] 02/14/2022 2:40 PM

## 2022-02-22 ENCOUNTER — Ambulatory Visit (INDEPENDENT_AMBULATORY_CARE_PROVIDER_SITE_OTHER): Payer: Medicare Other

## 2022-02-22 DIAGNOSIS — Z5181 Encounter for therapeutic drug level monitoring: Secondary | ICD-10-CM

## 2022-02-22 DIAGNOSIS — Z7901 Long term (current) use of anticoagulants: Secondary | ICD-10-CM | POA: Diagnosis not present

## 2022-02-22 DIAGNOSIS — I480 Paroxysmal atrial fibrillation: Secondary | ICD-10-CM

## 2022-02-22 LAB — POCT INR: INR: 2.5 (ref 2.0–3.0)

## 2022-02-22 NOTE — Patient Instructions (Signed)
continue warfarin dosage of 1 tablet (5 mg) every day EXCEPT 1/2 TABLET ON MONDAYS AND FRIDAYS - Recheck INR in 5 weeks Continue to have your greens on the same day (or days) each week [facility] [phone] ([place]);

## 2022-03-01 ENCOUNTER — Encounter: Payer: Self-pay | Admitting: Family Medicine

## 2022-03-01 ENCOUNTER — Ambulatory Visit (INDEPENDENT_AMBULATORY_CARE_PROVIDER_SITE_OTHER): Payer: Medicare Other | Admitting: Family Medicine

## 2022-03-01 VITALS — BP 130/84 | HR 71 | Temp 98.3°F | Ht 63.0 in | Wt 183.6 lb

## 2022-03-01 DIAGNOSIS — N184 Chronic kidney disease, stage 4 (severe): Secondary | ICD-10-CM

## 2022-03-01 DIAGNOSIS — Z7901 Long term (current) use of anticoagulants: Secondary | ICD-10-CM

## 2022-03-01 DIAGNOSIS — M19011 Primary osteoarthritis, right shoulder: Secondary | ICD-10-CM

## 2022-03-01 DIAGNOSIS — I480 Paroxysmal atrial fibrillation: Secondary | ICD-10-CM

## 2022-03-01 DIAGNOSIS — M19012 Primary osteoarthritis, left shoulder: Secondary | ICD-10-CM | POA: Diagnosis not present

## 2022-03-01 MED ORDER — TRIAMCINOLONE ACETONIDE 40 MG/ML IJ SUSP
40.0000 mg | Freq: Once | INTRAMUSCULAR | Status: AC
  Administered 2022-03-01: 40 mg via INTRA_ARTICULAR

## 2022-03-01 NOTE — Progress Notes (Signed)
Amy Underwood Amato, MD, [place] [facility] [place], [address]  Phone: [phone]  FAX: [phone]  Amy Underwood - 76 y.o. female  MRN [id number]  Date of Birth: [birth year]  Date: 03/01/2022  PCP: Pleas Koch, NP  Referral: Pleas Koch, NP  Chief Complaint  Patient presents with   Shoulder Pain    Bilateral but right is worse   Hand Numbness    Right   Subjective:   Amy Underwood is a 76 y.o. very pleasant female patient with Body mass index is 32.52 kg/m. who presents with the following:  B shoulder pain and R hand numbness.  Really on further questioning, the primary pain is in and about the shoulder and deep in the shoulder.  She has pain with terminal motion and she has pain when she gets her endpoint.  She has a deep dull ache in the shoulder, and she most notably feels this at nighttime.  I reviewed the patient's prior orthopedics note, and at that point on chart review there is at least some moderate glenohumeral arthritis bilaterally.  Also, it is felt that she has some adhesive capsulitis as well.  Certainly, the right now the patient has fairly dramatic loss of motion, worse on the right compared to the left.  At baseline GFR is 35-40, CKD 4.  About a year and a half ago, was having a lot of shoulder pain, had some [place] OA, now a few months ago. R shoulder will hurt a lot, R hand will go numb.  No neck pain or shoulder blade pain.    Review of Systems is noted in the HPI, as appropriate  Objective:   BP 130/84   Pulse 71   Temp 98.3 F (36.8 C) (Oral)   Ht 5\' 3"  (1.6 m)   Wt 183 lb 9 oz (83.3 kg)   SpO2 99%   BMI 32.52 kg/m   GEN: No acute distress; alert,appropriate. PULM: Breathing comfortably in no respiratory distress PSYCH: Normally interactive.   Exam is fairly limited due to loss of motion and pain.  Right shoulder: Limited to 40 degrees of abduction.  55  degrees of flexion.  Rotational movement is quite restricted, and limited with only small degree of abduction.  Left shoulder: Abduction to 125 degrees and flexion to 125 degrees.  She has roughly 30 degrees of total internal and external range of motion.  Strength is grossly 4/5 throughout.  Laboratory and Imaging Data:  Assessment and Plan:     ICD-10-CM   1. Arthritis of both glenohumeral joints  M19.011 triamcinolone acetonide (KENALOG-40) injection 40 mg   M19.012 triamcinolone acetonide (KENALOG-40) injection 40 mg    2. Chronic kidney disease, stage IV (severe) (HCC)  N18.4     3. Paroxysmal atrial fibrillation (HCC)  I48.0     4. Anticoagulated on Coumadin  Z79.01      Quite symptomatic glenohumeral osteoarthritis, chronic with exacerbation.  I expect that her loss of motion is directly from the glenohumeral joint arthritis, rather than a pure frozen shoulder.  For symptomatic relief, we are going to do bilateral shoulder injections today, understanding that her cartilage loss and arthritic changes are going to be permanent in nature.  At this point, she has no interested in shoulder arthroplasty.  Intraarticular Shoulder Aspiration/Injection Procedure Note [name] 1946-02-08 Date of procedure: 03/01/2022  Procedure: Large Joint Aspiration / Injection of Shoulder, R Intraarticular Indications: Pain  Procedure Details Verbal consent was obtained from the patient. Risks explained and contrasted with benefits and alternatives. Patient prepped with Chloraprep and Ethyl Chloride used for anesthesia. An intraarticular shoulder injection was performed using the posterior approach; needle placed into joint capsule without difficulty. The patient tolerated the procedure well and had decreased pain post injection. No complications. Injection: 9 cc of Lidocaine 1% and 1 mL Kenalog 40 mg. Needle: 21 gauge, 2 inch   Intraarticular Shoulder Aspiration/Injection Procedure  Note [name] [birth year] Date of procedure: 03/01/2022  Procedure: Large Joint Aspiration / Injection of Shoulder, L Intraarticular Indications: Pain  Procedure Details Verbal consent was obtained from the patient. Risks explained and contrasted with benefits and alternatives. Patient prepped with Chloraprep and Ethyl Chloride used for anesthesia. An intraarticular shoulder injection was performed using the posterior approach; needle placed into joint capsule without difficulty. The patient tolerated the procedure well and had decreased pain post injection. No complications. Injection: 9 cc of Lidocaine 1% and 1 mL Kenalog 40 mg. Needle: 21 gauge, 2 inch   Follow-up if needed: No follow-ups on file.  [employer] One speech-to-text software was used for transcription in this dictation.  Possible transcriptional errors can occur using Editor, commissioning.   Signed,  Maud Deed. Cleaven Demario, MD   Outpatient Encounter Medications as of 03/01/2022  Medication Sig   albuterol (VENTOLIN HFA) 108 (90 Base) MCG/ACT inhaler Inhale 1-2 puffs into the lungs every 6 (six) hours as needed for wheezing or shortness of breath.   allopurinol (ZYLOPRIM) 100 MG tablet Take 100 mg by mouth daily.   amLODipine (NORVASC) 10 MG tablet Take 1 tablet (10 mg total) by mouth daily.   atorvastatin (LIPITOR) 20 MG tablet Take 20 mg by mouth daily.   calcipotriene (DOVONOX) 0.005 % cream Apply 1 application. topically 2 (two) times daily as needed (psoriasis).   Calcipotriene-Betameth Diprop [facility]) 0.005-0.064 % CREA Apply to affected skin once a day   Cholecalciferol (DIALYVITE VITAMIN D 5000) 125 MCG (5000 UT) capsule Take 5,000 Units by mouth daily.   cyanocobalamin 1000 MCG tablet Take 1,000 mcg by mouth daily.   dofetilide (TIKOSYN) 125 MCG capsule Take 1 capsule (125 mcg total) by mouth 2 (two) times daily.   FARXIGA 10 MG TABS tablet Take 10 mg by mouth every morning.   ferrous sulfate 325 (65 FE) MG  tablet Take 650 mg by mouth every 14 (fourteen) days.   losartan (COZAAR) 100 MG tablet Take 100 mg by mouth daily.   magnesium oxide (MAG-OX) 400 MG tablet Take 400 mg by mouth daily.   pantoprazole (PROTONIX) 40 MG tablet Take 1 tablet by mouth daily for heartburn.   warfarin (COUMADIN) 5 MG tablet Take 1 tablet (5 mg total) by mouth daily. (Patient taking differently: Take 2.5-5 mg by mouth See admin instructions. Skip dose on 4/26, 2.5 mg on 4/27 then continue taking 5 mg daily except 2.5 mg on Mondays and Fridays)   [EXPIRED] triamcinolone acetonide (KENALOG-40) injection 40 mg    [EXPIRED] triamcinolone acetonide (KENALOG-40) injection 40 mg    No facility-administered encounter medications on file as of 03/01/2022.

## 2022-03-22 ENCOUNTER — Other Ambulatory Visit: Payer: Self-pay | Admitting: *Deleted

## 2022-03-22 MED ORDER — WARFARIN SODIUM 5 MG PO TABS: ORAL_TABLET | ORAL | 0 refills | Status: DC

## 2022-03-24 ENCOUNTER — Other Ambulatory Visit: Payer: Self-pay | Admitting: Cardiology

## 2022-03-24 NOTE — Telephone Encounter (Signed)
Pt's pharmacy is requesting a refill on magnesium oxide 400 mg tablets. Would Dr. Quentin Ore like to refill this medication? Please address

## 2022-03-25 NOTE — Progress Notes (Unsigned)
Cardiology Office Note:    Date:  03/27/2022   ID:  Amy Underwood, DOB November 18, 1945, MRN [id number]  PCP:  Pleas Koch, NP   [place] Providers Cardiologist:  None Electrophysiologist:  Vickie Epley, MD     Referring MD: Pleas Koch, NP   CC: Discussion of LAA-O procedure Consulted for the evaluation of LAA-O at the behest of Dr. Quentin Ore  History of Present Illness:    Amy Underwood is a 76 y.o. female with a hx of PAF with HTN, CAC, and pernicious and IDA.  Seen for Watchman   Patient notes that she is doing well.   Since last visit with Dr. Quentin Ore notes no symptoms- she cannot afford DOAC, she does not know the etiology of her pernicious anemia . There are no interval hospital/ED visit.   No bleeding issues.  Has had to deal with the restrictions of warfarin.  No chest pain or pressure .  No SOB/DOE and no PND/Orthopnea.  No weight gain or leg swelling.  No palpitations or syncope.   Past Medical History:  Diagnosis Date   Adhesive capsulitis of right shoulder    Asthma    Hyperlipidemia    Hypertension    Iron deficiency anemia    Osteoarthritis    Paroxysmal atrial fibrillation (HCC)    Pernicious anemia    Psoriasis     Past Surgical History:  Procedure Laterality Date   ATRIAL FIBRILLATION ABLATION N/A 01/03/2022   Procedure: ATRIAL FIBRILLATION ABLATION;  Surgeon: Vickie Epley, MD;  Location: [facility];  Service: Cardiovascular;  Laterality: N/A;   CESAREAN SECTION     3   CHOLECYSTECTOMY     HIP ARTHROPLASTY Left    KNEE ARTHROPLASTY Bilateral     Current Medications: Current Meds  Medication Sig   albuterol (VENTOLIN HFA) 108 (90 Base) MCG/ACT inhaler Inhale 1-2 puffs into the lungs every 6 (six) hours as needed for wheezing or shortness of breath.   allopurinol (ZYLOPRIM) 100 MG tablet Take 100 mg by mouth daily.   amLODipine (NORVASC) 10 MG tablet Take 1 tablet (10 mg total) by mouth daily.   atorvastatin  (LIPITOR) 20 MG tablet Take 20 mg by mouth daily.   calcipotriene (DOVONOX) 0.005 % cream Apply 1 application. topically 2 (two) times daily as needed (psoriasis).   Calcipotriene-Betameth Diprop [facility]) 0.005-0.064 % CREA Apply to affected skin once a day   Cholecalciferol (DIALYVITE VITAMIN D 5000) 125 MCG (5000 UT) capsule Take 5,000 Units by mouth daily.   clobetasol cream (TEMOVATE) 0.05 % daily.   cyanocobalamin 1000 MCG tablet Take 1,000 mcg by mouth daily.   dofetilide (TIKOSYN) 125 MCG capsule Take 1 capsule (125 mcg total) by mouth 2 (two) times daily.   FARXIGA 10 MG TABS tablet Take 10 mg by mouth every morning.   ferrous sulfate 325 (65 FE) MG tablet Take 650 mg by mouth every 14 (fourteen) days.   losartan (COZAAR) 100 MG tablet Take 100 mg by mouth daily.   magnesium oxide (MAG-OX) 400 MG tablet Take 400 mg by mouth daily.   pantoprazole (PROTONIX) 40 MG tablet Take 1 tablet by mouth daily for heartburn.   warfarin (COUMADIN) 5 MG tablet Take 1/2 tablet to 1 tablet daily or as directed by Anticoagulation Clinic.     Allergies:   Cefaclor   Social History   Socioeconomic History   Marital status: Widowed    Spouse name: Not on file   Number  of children: Not on file   Years of education: Not on file   Highest education level: Not on file  Occupational History   Not on file  Tobacco Use   Smoking status: Former    Types: Cigarettes    Start date: 2001   Smokeless tobacco: Never  Vaping Use   Vaping Use: Never used  Substance and Sexual Activity   Alcohol use: Not on file   Drug use: Never   Sexual activity: Not Currently  Other Topics Concern   Not on file  Social History Narrative   Not on file   Social Determinants of Health   Financial Resource Strain: Not on file  Food Insecurity: Not on file  Transportation Needs: Not on file  Physical Activity: Not on file  Stress: Not on file  Social Connections: Not on file     Family History: The patient's  family history includes Heart disease in her mother.  ROS:   Please see the history of present illness.     All other systems reviewed and are negative.  EKGs/Labs/Other Studies Reviewed:    The following studies were reviewed today:  EKG:  EKG is  ordered today.  The ekg ordered today demonstrates  03/27/22: Junctional rhythm rate 64  Recent Labs: 05/25/2021: ALT 20 11/30/2021: Magnesium 1.9 12/13/2021: BUN 37; Creatinine, Ser 1.56; Hemoglobin 13.1; Platelets 225; Potassium 4.5; Sodium 146  Recent Lipid Panel    Component Value Date/Time   CHOL 154 05/25/2021 1047   TRIG 133.0 05/25/2021 1047   HDL 60.60 05/25/2021 1047   CHOLHDL 3 05/25/2021 1047   VLDL 26.6 05/25/2021 1047   LDLCALC 66 05/25/2021 1047     Risk Assessment/Calculations:    CHA2DS2-VASc Score = 3   This indicates a 3.2% annual risk of stroke. The patient's score is based upon: CHF History: 0 HTN History: 1 Diabetes History: 0 Stroke History: 0 Vascular Disease History: 0 Age Score: 1 Gender Score: 1          Physical Exam:    VS:  BP 140/62   Pulse 64   Ht 5\' 3"  (1.6 m)   Wt 182 lb (82.6 kg)   SpO2 99%   BMI 32.24 kg/m     Wt Readings from Last 3 Encounters:  03/27/22 182 lb (82.6 kg)  03/01/22 183 lb 9 oz (83.3 kg)  02/14/22 183 lb 3.2 oz (83.1 kg)    Gen: no distress   Neck: No JVD Cardiac: No Rubs or Gallops, no Murmur, RRR +2 radial pulses Respiratory: Clear to auscultation bilaterally, normal effort, normal  respiratory rate GI: Soft, nontender, non-distended  MS: No edema;  moves all extremities Integument: Skin feels warm Neuro:  At time of evaluation, alert and oriented to person/place/time/situation  Psych: Normal affect, patient feels well   ASSESSMENT:    1. Paroxysmal atrial fibrillation (HCC)    PLAN:    [place] Referral for Left Atrial Appendage Closure with Non-Valvular Atrial Fibrillation   Amy Underwood is a 76 y.o. female is  being referred to the [facility] Team for evaluation for Left Atrial Appendage Closure with Watchman device for the management of stroke risk resulting form non-valvular atrial fibrillation.    Base upon Amy Underwood's history, she is felt to be a poor candidate for long-term anticoagulation because of an inability or significant difficulty with maintaining patient in therapeutic range - she has had to change her lifestyle to keep INR OK and  cannot afford DOAC.  This hardship may lend itself best to LAA-O therapyThe patient has a HAS-BLED score of 3 indicating a Yearly Major Bleeding Risk of 3.74%.      Her CHADS2-VASc Score is 3 with an unadjusted Ischemic Stroke Rate (% per year) of 3.2%.    Her stroke risk necessitates a strategy of stroke prevention with either long-term oral anticoagulation or left atrial appendage occlusion therapy. We have discussed their bleeding risk in the context of their comorbid medical problems, as well as the rationale for referral for evaluation of Watchman left atrial appendage occlusion therapy. While the patient is at high long-term bleeding risk, they may be appropriate for short-term anticoagulation. Based on this individual patient's stroke and bleeding risk, a shared decision has been made to refer the patient for consideration of Watchman left atrial appendage closure utilizing the [employer] of Cardiology shared decision tool.  Reviewed he LAA morphology and her CT.  Consented for procedural TEE.  She knows that may end up needing follow up TEE not CT if worsening Creatinine  EP follow up      Medication Adjustments/Labs and Tests Ordered: Current medicines are reviewed at length with the patient today.  Concerns regarding medicines are outlined above.  Orders Placed This Encounter  Procedures   CBC   Basic metabolic panel   EKG [id number]   No orders of the defined types were placed in this encounter.   Patient Instructions   Medication Instructions:  Your physician recommends that you continue on your current medications as directed. Please refer to the Current Medication list given to you today.  *If you need a refill on your cardiac medications before your next appointment, please call your pharmacy*   Lab Work: TODAY: CBC, BMP If you have labs (blood work) drawn today and your tests are completely normal, you will receive your results only by: [place] (if you have MyChart) OR A paper copy in the mail If you have any lab test that is abnormal or we need to change your treatment, we will call you to review the results.   Testing/Procedures: NONE   Follow-Up: As scheduled At [facility], you and your health needs are our priority.  As part of our continuing mission to provide you with exceptional heart care, we have created designated Provider Care Teams.  These Care Teams include your primary Cardiologist (physician) and Advanced Practice Providers (APPs -  Physician Assistants and Nurse Practitioners) who all work together to provide you with the care you need, when you need it.   Important Information About Sugar         Signed, Werner Lean, MD  03/27/2022 11:13 AM    [place]

## 2022-03-27 ENCOUNTER — Encounter: Payer: Self-pay | Admitting: Internal Medicine

## 2022-03-27 ENCOUNTER — Ambulatory Visit (INDEPENDENT_AMBULATORY_CARE_PROVIDER_SITE_OTHER): Payer: Medicare Other | Admitting: Internal Medicine

## 2022-03-27 VITALS — BP 140/62 | HR 64 | Ht 63.0 in | Wt 182.0 lb

## 2022-03-27 DIAGNOSIS — I480 Paroxysmal atrial fibrillation: Secondary | ICD-10-CM

## 2022-03-27 LAB — BASIC METABOLIC PANEL
BUN/Creatinine Ratio: 21 (ref 12–28)
BUN: 30 mg/dL — ABNORMAL HIGH (ref 8–27)
CO2: 21 mmol/L (ref 20–29)
Calcium: 9.4 mg/dL (ref 8.7–10.3)
Chloride: 105 mmol/L (ref 96–106)
Creatinine, Ser: 1.44 mg/dL — ABNORMAL HIGH (ref 0.57–1.00)
Glucose: 127 mg/dL — ABNORMAL HIGH (ref 70–99)
Potassium: 4.6 mmol/L (ref 3.5–5.2)
Sodium: 142 mmol/L (ref 134–144)
eGFR: 38 mL/min/{1.73_m2} — ABNORMAL LOW (ref >59)

## 2022-03-27 LAB — CBC
Hematocrit: 39.1 % (ref 34.0–46.6)
Hemoglobin: 13.1 g/dL (ref 11.1–15.9)
MCH: 30.5 pg (ref 26.6–33.0)
MCHC: 33.5 g/dL (ref 31.5–35.7)
MCV: 91 fL (ref 79–97)
Platelets: 157 10*3/uL (ref 150–450)
RBC: 4.29 x10E6/uL (ref 3.77–5.28)
RDW: 13.3 % (ref 11.7–15.4)
WBC: 5.1 10*3/uL (ref 3.4–10.8)

## 2022-03-27 NOTE — Patient Instructions (Signed)
Medication Instructions:  Your physician recommends that you continue on your current medications as directed. Please refer to the Current Medication list given to you today.  *If you need a refill on your cardiac medications before your next appointment, please call your pharmacy*   Lab Work: TODAY: CBC, BMP If you have labs (blood work) drawn today and your tests are completely normal, you will receive your results only by: [place] (if you have MyChart) OR A paper copy in the mail If you have any lab test that is abnormal or we need to change your treatment, we will call you to review the results.   Testing/Procedures: NONE   Follow-Up: As scheduled At [facility], you and your health needs are our priority.  As part of our continuing mission to provide you with exceptional heart care, we have created designated Provider Care Teams.  These Care Teams include your primary Cardiologist (physician) and Advanced Practice Providers (APPs -  Physician Assistants and Nurse Practitioners) who all work together to provide you with the care you need, when you need it.   Important Information About Sugar

## 2022-03-29 ENCOUNTER — Ambulatory Visit (INDEPENDENT_AMBULATORY_CARE_PROVIDER_SITE_OTHER): Payer: Medicare Other

## 2022-03-29 DIAGNOSIS — Z5181 Encounter for therapeutic drug level monitoring: Secondary | ICD-10-CM | POA: Diagnosis not present

## 2022-03-29 DIAGNOSIS — Z7901 Long term (current) use of anticoagulants: Secondary | ICD-10-CM

## 2022-03-29 DIAGNOSIS — I480 Paroxysmal atrial fibrillation: Secondary | ICD-10-CM

## 2022-03-29 LAB — POCT INR: INR: 2.5 (ref 2.0–3.0)

## 2022-03-29 NOTE — Patient Instructions (Signed)
continue warfarin dosage of 1 tablet (5 mg) every day EXCEPT 1/2 TABLET ON MONDAYS AND FRIDAYS - Recheck INR in 1 weeks Continue to have your greens on the same day (or days) each week; EAT LESS GREENS; Coumadin Clinic [phone] [facility]);

## 2022-03-31 ENCOUNTER — Telehealth: Payer: Self-pay | Admitting: Primary Care

## 2022-03-31 NOTE — Telephone Encounter (Signed)
Left message for patient to call back and schedule the Medicare Annual Wellness Visit (AWV) virtually or by telephone.  Last AWV 07/05/20   Any questions, please call me at [phone]

## 2022-04-03 ENCOUNTER — Ambulatory Visit (INDEPENDENT_AMBULATORY_CARE_PROVIDER_SITE_OTHER): Payer: Medicare Other | Admitting: *Deleted

## 2022-04-03 DIAGNOSIS — Z0000 Encounter for general adult medical examination without abnormal findings: Secondary | ICD-10-CM

## 2022-04-03 NOTE — Patient Instructions (Signed)
Amy Underwood , Thank you for taking time to come for your Medicare Wellness Visit. I appreciate your ongoing commitment to your health goals. Please review the following plan we discussed and let me know if I can assist you in the future.   Screening recommendations/referrals: Colonoscopy: up to date Mammogram: up to date Bone Density: up to date Recommended yearly ophthalmology/optometry visit for glaucoma screening and checkup Recommended yearly dental visit for hygiene and checkup  Vaccinations: Influenza vaccine: up to date Pneumococcal vaccine: up to date Tdap vaccine: up to date Shingles vaccine: up to date    Advanced directives: Education provided  Conditions/risks identified:      Preventive Care 75 Years and Older, Female Preventive care refers to lifestyle choices and visits with your health care provider that can promote health and wellness. What does preventive care include? A yearly physical exam. This is also called an annual well check. Dental exams once or twice a year. Routine eye exams. Ask your health care provider how often you should have your eyes checked. Personal lifestyle choices, including: Daily care of your teeth and gums. Regular physical activity. Eating a healthy diet. Avoiding tobacco and drug use. Limiting alcohol use. Practicing safe sex. Taking low-dose aspirin every day. Taking vitamin and mineral supplements as recommended by your health care provider. What happens during an annual well check? The services and screenings done by your health care provider during your annual well check will depend on your age, overall health, lifestyle risk factors, and family history of disease. Counseling  Your health care provider may ask you questions about your: Alcohol use. Tobacco use. Drug use. Emotional well-being. Home and relationship well-being. Sexual activity. Eating habits. History of falls. Memory and ability to understand  (cognition). Work and work Statistician. Reproductive health. Screening  You may have the following tests or measurements: Height, weight, and BMI. Blood pressure. Lipid and cholesterol levels. These may be checked every 5 years, or more frequently if you are over 28 years old. Skin check. Lung cancer screening. You may have this screening every year starting at age 75 if you have a 30-pack-year history of smoking and currently smoke or have quit within the past 15 years. Fecal occult blood test (FOBT) of the stool. You may have this test every year starting at age 75. Flexible sigmoidoscopy or colonoscopy. You may have a sigmoidoscopy every 5 years or a colonoscopy every 10 years starting at age 75. Hepatitis C blood test. Hepatitis B blood test. Sexually transmitted disease (STD) testing. Diabetes screening. This is done by checking your blood sugar (glucose) after you have not eaten for a while (fasting). You may have this done every 1-3 years. Bone density scan. This is done to screen for osteoporosis. You may have this done starting at age 75. Mammogram. This may be done every 1-2 years. Talk to your health care provider about how often you should have regular mammograms. Talk with your health care provider about your test results, treatment options, and if necessary, the need for more tests. Vaccines  Your health care provider may recommend certain vaccines, such as: Influenza vaccine. This is recommended every year. Tetanus, diphtheria, and acellular pertussis (Tdap, Td) vaccine. You may need a Td booster every 10 years. Zoster vaccine. You may need this after age 75. Pneumococcal 13-valent conjugate (PCV13) vaccine. One dose is recommended after age 75. Pneumococcal polysaccharide (PPSV23) vaccine. One dose is recommended after age 19. Talk to your health care provider about which screenings and  vaccines you need and how often you need them. This information is not intended to  replace advice given to you by your health care provider. Make sure you discuss any questions you have with your health care provider. Document Released: 09/17/2015 Document Revised: 05/10/2016 Document Reviewed: 06/22/2015 Elsevier Interactive Patient Education  2017 [place] Prevention in the Home Falls can cause injuries. They can happen to people of all ages. There are many things you can do to make your home safe and to help prevent falls. What can I do on the outside of my home? Regularly fix the edges of walkways and driveways and fix any cracks. Remove anything that might make you trip as you walk through a door, such as a raised step or threshold. Trim any bushes or trees on the path to your home. Use bright outdoor lighting. Clear any walking paths of anything that might make someone trip, such as rocks or tools. Regularly check to see if handrails are loose or broken. Make sure that both sides of any steps have handrails. Any raised decks and porches should have guardrails on the edges. Have any leaves, snow, or ice cleared regularly. Use sand or salt on walking paths during winter. Clean up any spills in your garage right away. This includes oil or grease spills. What can I do in the bathroom? Use night lights. Install grab bars by the toilet and in the tub and shower. Do not use towel bars as grab bars. Use non-skid mats or decals in the tub or shower. If you need to sit down in the shower, use a plastic, non-slip stool. Keep the floor dry. Clean up any water that spills on the floor as soon as it happens. Remove soap buildup in the tub or shower regularly. Attach bath mats securely with double-sided non-slip rug tape. Do not have throw rugs and other things on the floor that can make you trip. What can I do in the bedroom? Use night lights. Make sure that you have a light by your bed that is easy to reach. Do not use any sheets or blankets that are too big for  your bed. They should not hang down onto the floor. Have a firm chair that has side arms. You can use this for support while you get dressed. Do not have throw rugs and other things on the floor that can make you trip. What can I do in the kitchen? Clean up any spills right away. Avoid walking on wet floors. Keep items that you use a lot in easy-to-reach places. If you need to reach something above you, use a strong step stool that has a grab bar. Keep electrical cords out of the way. Do not use floor polish or wax that makes floors slippery. If you must use wax, use non-skid floor wax. Do not have throw rugs and other things on the floor that can make you trip. What can I do with my stairs? Do not leave any items on the stairs. Make sure that there are handrails on both sides of the stairs and use them. Fix handrails that are broken or loose. Make sure that handrails are as [place] as the stairways. Check any carpeting to make sure that it is firmly attached to the stairs. Fix any carpet that is loose or worn. Avoid having throw rugs at the top or bottom of the stairs. If you do have throw rugs, attach them to the floor with carpet tape. Make  sure that you have a light switch at the top of the stairs and the bottom of the stairs. If you do not have them, ask someone to add them for you. What else can I do to help prevent falls? Wear shoes that: Do not have high heels. Have rubber bottoms. Are comfortable and fit you well. Are closed at the toe. Do not wear sandals. If you use a stepladder: Make sure that it is fully opened. Do not climb a closed stepladder. Make sure that both sides of the stepladder are locked into place. Ask someone to hold it for you, if possible. Clearly mark and make sure that you can see: Any grab bars or handrails. First and last steps. Where the edge of each step is. Use tools that help you move around (mobility aids) if they are needed. These  include: Canes. Walkers. Scooters. Crutches. Turn on the lights when you go into a dark area. Replace any light bulbs as soon as they burn out. Set up your furniture so you have a clear path. Avoid moving your furniture around. If any of your floors are uneven, fix them. If there are any pets around you, be aware of where they are. Review your medicines with your doctor. Some medicines can make you feel dizzy. This can increase your chance of falling. Ask your doctor what other things that you can do to help prevent falls. This information is not intended to replace advice given to you by your health care provider. Make sure you discuss any questions you have with your health care provider. Document Released: 06/17/2009 Document Revised: 01/27/2016 Document Reviewed: 09/25/2014 Elsevier Interactive Patient Education  2017 [employer].

## 2022-04-03 NOTE — Progress Notes (Addendum)
Subjective:   Amy Underwood is a 76 y.o. female who presents for Medicare Annual (Subsequent) preventive examination.  I connected with  Tikisha Molinaro 04-03-2022 by a telephone enabled telemedicine application and verified that I am speaking with the correct person using two identifiers.   I discussed the limitations of evaluation and management by telemedicine. The patient expressed understanding and agreed to proceed.  Patient location: home  Provider location: Tele-health-home  .   Review of Systems     Cardiac Risk Factors include: advanced age (>10men, >71 women);family history of premature cardiovascular disease;hypertension;obesity (BMI >30kg/m2)     Objective:    Today's Vitals   There is no height or weight on file to calculate BMI.     04/03/2022    8:33 AM 01/03/2022    5:56 AM  Advanced Directives  Does Patient Have a Medical Advance Directive? No No  Would patient like information on creating a medical advance directive? No - Patient declined No - Patient declined    Current Medications (verified) Outpatient Encounter Medications as of 04/03/2022  Medication Sig   albuterol (VENTOLIN HFA) 108 (90 Base) MCG/ACT inhaler Inhale 1-2 puffs into the lungs every 6 (six) hours as needed for wheezing or shortness of breath.   allopurinol (ZYLOPRIM) 100 MG tablet Take 100 mg by mouth daily.   amLODipine (NORVASC) 10 MG tablet Take 1 tablet (10 mg total) by mouth daily.   calcipotriene (DOVONOX) 0.005 % cream Apply 1 application. topically 2 (two) times daily as needed (psoriasis).   Cholecalciferol (DIALYVITE VITAMIN D 5000) 125 MCG (5000 UT) capsule Take 5,000 Units by mouth daily.   clobetasol cream (TEMOVATE) 0.05 % daily.   cyanocobalamin 1000 MCG tablet Take 1,000 mcg by mouth daily.   dofetilide (TIKOSYN) 125 MCG capsule Take 1 capsule (125 mcg total) by mouth 2 (two) times daily.   FARXIGA 10 MG TABS tablet Take 10 mg by mouth every morning.   ferrous sulfate 325  (65 FE) MG tablet Take 650 mg by mouth every 14 (fourteen) days.   MAGnesium-Oxide 400 (240 Mg) MG tablet TAKE 1 TABLET(400 MG) BY MOUTH DAILY   pantoprazole (PROTONIX) 40 MG tablet Take 1 tablet by mouth daily for heartburn.   warfarin (COUMADIN) 5 MG tablet Take 1/2 tablet to 1 tablet daily or as directed by Anticoagulation Clinic.   [DISCONTINUED] atorvastatin (LIPITOR) 20 MG tablet Take 20 mg by mouth daily.   [DISCONTINUED] Calcipotriene-Betameth Diprop [facility]) 0.005-0.064 % CREA Apply to affected skin once a day   [DISCONTINUED] losartan (COZAAR) 100 MG tablet Take 100 mg by mouth daily.   No facility-administered encounter medications on file as of 04/03/2022.    Allergies (verified) Cefaclor   History: Past Medical History:  Diagnosis Date   Adhesive capsulitis of right shoulder    Asthma    Hyperlipidemia    Hypertension    Iron deficiency anemia    Osteoarthritis    Paroxysmal atrial fibrillation (HCC)    Pernicious anemia    Psoriasis    Past Surgical History:  Procedure Laterality Date   ATRIAL FIBRILLATION ABLATION N/A 01/03/2022   Procedure: ATRIAL FIBRILLATION ABLATION;  Surgeon: Vickie Epley, MD;  Location: [facility];  Service: Cardiovascular;  Laterality: N/A;   CESAREAN SECTION     3   CHOLECYSTECTOMY     HIP ARTHROPLASTY Left    KNEE ARTHROPLASTY Bilateral    Family History  Problem Relation Age of Onset   Heart disease Mother  Social History   Socioeconomic History   Marital status: Widowed    Spouse name: Not on file   Number of children: Not on file   Years of education: Not on file   Highest education level: Not on file  Occupational History   Not on file  Tobacco Use   Smoking status: Former    Types: Cigarettes    Start date: 2001   Smokeless tobacco: Never  Vaping Use   Vaping Use: Never used  Substance and Sexual Activity   Alcohol use: Not on file   Drug use: Never   Sexual activity: Not Currently  Other Topics  Concern   Not on file  Social History Narrative   Not on file   Social Determinants of Health   Financial Resource Strain: Low Risk  (04/03/2022)   Overall Financial Resource Strain (CARDIA)    Difficulty of Paying Living Expenses: Not hard at all  Food Insecurity: No Food Insecurity (04/03/2022)   Hunger Vital Sign    Worried About Running Out of Food in the Last Year: Never true    Ran Out of Food in the Last Year: Never true  Transportation Needs: No Transportation Needs (04/03/2022)   PRAPARE - Hydrologist (Medical): No    Lack of Transportation (Non-Medical): No  Physical Activity: Insufficiently Active (04/03/2022)   Exercise Vital Sign    Days of Exercise per Week: 4 days    Minutes of Exercise per Session: 30 min  Stress: No Stress Concern Present (04/03/2022)   [place]    Feeling of Stress : Not at all  Social Connections: Socially Isolated (04/03/2022)   Social Connection and Isolation Panel [NHANES]    Frequency of Communication with Friends and Family: Three times a week    Frequency of Social Gatherings with Friends and Family: Three times a week    Attends Religious Services: Never    Active Member of Clubs or Organizations: No    Attends Archivist Meetings: Never    Marital Status: Widowed    Tobacco Counseling Counseling given: Not Answered   Clinical Intake:  Pre-visit preparation completed: Yes  Pain : No/denies pain     Nutritional Risks: None Diabetes: No     Diabetic?  no  Interpreter Needed?: No  Information entered by :: Leroy Kennedy LPN   Activities of Daily Living    04/03/2022    8:47 AM  In your present state of health, do you have any difficulty performing the following activities:  Hearing? 0  Vision? 0  Difficulty concentrating or making decisions? 0  Walking or climbing stairs? 0  Dressing or bathing? 0  Doing errands,  shopping? 0  Preparing Food and eating ? N  Using the Toilet? N  In the past six months, have you accidently leaked urine? N  Do you have problems with loss of bowel control? N  Managing your Medications? N  Managing your Finances? N  Housekeeping or managing your Housekeeping? N    Patient Care Team: Pleas Koch, NP as PCP - General (Internal Medicine) Vickie Epley, MD as PCP - Electrophysiology (Cardiology)  Indicate any recent Medical Services you may have received from other than [facility] providers in the past year (date may be approximate).     Assessment:   This is a routine wellness examination for Laylani.  Hearing/Vision screen Hearing Screening - Comments:: No trouble hearing Vision  Screening - Comments:: Not up to date  Dietary issues and exercise activities discussed: Current Exercise Habits: Home exercise routine, Type of exercise: walking, Time (Minutes): 30, Frequency (Times/Week): 4, Weekly Exercise (Minutes/Week): 120, Intensity: Mild   Goals Addressed             This Visit's Progress    Weight (lb) < 200 lb (90.7 kg)       10lb weight loss       Depression Screen    04/03/2022    8:37 AM 05/25/2021    9:50 AM  PHQ 2/9 Scores  PHQ - 2 Score 0 0  PHQ- 9 Score  1    Fall Risk    04/03/2022    8:34 AM 05/25/2021    9:50 AM  Fall Risk   Falls in the past year? 0 1  Number falls in past yr: 0 0  Injury with Fall? 0 0  Follow up Falls evaluation completed;Education provided;Falls prevention discussed Falls evaluation completed    FALL RISK PREVENTION PERTAINING TO THE HOME:  Any stairs in or around the home? No  If so, are there any without handrails? No  Home free of loose throw rugs in walkways, pet beds, electrical cords, etc? Yes  Adequate lighting in your home to reduce risk of falls? Yes   ASSISTIVE DEVICES UTILIZED TO PREVENT FALLS:  Life alert? No  Use of a cane, walker or w/c? No  Grab bars in the bathroom? Yes  Shower  chair or bench in shower? Yes  Elevated toilet seat or a handicapped toilet? Yes   TIMED UP AND GO:  Was the test performed? No .    Cognitive Function:        04/03/2022    8:39 AM  6CIT Screen  What Year? 0 points  What month? 0 points  What time? 0 points  Count back from 20 0 points  Months in reverse 2 points  Repeat phrase 2 points  Total Score 4 points    Immunizations Immunization History  Administered Date(s) Administered   Fluad Quad(high Dose 65+) 05/25/2021   Influenza, High Dose Seasonal PF 05/31/2019   PFIZER(Purple Top)SARS-COV-2 Vaccination 11/07/2019, 11/28/2019, 06/21/2020   Pneumococcal Conjugate-13 07/06/2015   Pneumococcal Polysaccharide-23 08/05/2013, 06/04/2017   Td 09/04/2002, 08/04/2015   Zoster Recombinat (Shingrix) 04/02/2018, 06/05/2018   Zoster, Live 09/09/2008    TDAP status: Up to date  Flu Vaccine status: Up to date  Pneumococcal vaccine status: Up to date  Covid-19 vaccine status: Information provided on how to obtain vaccines.   Qualifies for Shingles Vaccine? No   Zostavax completed Yes   Shingrix Completed?: Yes  Screening Tests Health Maintenance  Topic Date Due   Hepatitis C Screening  Never done   MAMMOGRAM  Never done   INFLUENZA VACCINE  04/04/2022   COVID-19 Vaccine (4 - Pfizer risk series) 04/19/2022 (Originally 08/16/2020)   DEXA SCAN  04/04/2023 (Originally 08/30/2012)   COLONOSCOPY (Pts 45-34yrs Insurance coverage will need to be confirmed)  04/04/2023 (Originally 08/30/1992)   TETANUS/TDAP  06/15/2026   Pneumonia Vaccine 5+ Years old  Completed   Zoster Vaccines- Shingrix  Completed   HPV VACCINES  Aged Out    Health Maintenance  Health Maintenance Due  Topic Date Due   Hepatitis C Screening  Never done   MAMMOGRAM  Never done   INFLUENZA VACCINE  04/04/2022    Colorectal cancer screening: Type of screening: Colonoscopy. Completed 2022. Repeat every 10 years  Mammogram  status: Completed  . Repeat  every year  Bone Density status: Completed 2022. Results reflect: Bone density results: OSTEOPOROSIS. Repeat every 5 years. Per patient  Lung Cancer Screening: (Low Dose CT Chest recommended if Age 56-80 years, 30 pack-year currently smoking OR have quit w/in 15years.) does not qualify.   Lung Cancer Screening Referral:   Additional Screening:  Hepatitis C Screening   Vision Screening: Recommended annual ophthalmology exams for early detection of glaucoma and other disorders of the eye. Is the patient up to date with their annual eye exam?  No  Who is the provider or what is the name of the office in which the patient attends annual eye exams?  If pt is not established with a provider, would they like to be referred to a provider to establish care? No .   Dental Screening: Recommended annual dental exams for proper oral hygiene  Community Resource Referral / Chronic Care Management: CRR required this visit?  No   CCM required this visit?  No      Plan:     I have personally reviewed and noted the following in the patient's chart:   Medical and social history Use of alcohol, tobacco or illicit drugs  Current medications and supplements including opioid prescriptions.  Functional ability and status Nutritional status Physical activity Advanced directives List of other physicians Hospitalizations, surgeries, and ER visits in previous 12 months Vitals Screenings to include cognitive, depression, and falls Referrals and appointments  In addition, I have reviewed and discussed with patient certain preventive protocols, quality metrics, and best practice recommendations. A written personalized care plan for preventive services as well as general preventive health recommendations were provided to patient.     Leroy Kennedy, LPN   [id number]/[id number]   Nurse Notes:

## 2022-04-04 ENCOUNTER — Other Ambulatory Visit: Payer: Self-pay

## 2022-04-04 DIAGNOSIS — K219 Gastro-esophageal reflux disease without esophagitis: Secondary | ICD-10-CM

## 2022-04-04 DIAGNOSIS — I10 Essential (primary) hypertension: Secondary | ICD-10-CM

## 2022-04-04 DIAGNOSIS — E782 Mixed hyperlipidemia: Secondary | ICD-10-CM

## 2022-04-04 MED ORDER — ATORVASTATIN CALCIUM 20 MG PO TABS: 20.0000 mg | ORAL_TABLET | Freq: Every day | ORAL | 0 refills | Status: DC

## 2022-04-04 MED ORDER — LOSARTAN POTASSIUM 100 MG PO TABS: 100.0000 mg | ORAL_TABLET | Freq: Every day | ORAL | 0 refills | Status: DC

## 2022-04-04 NOTE — Telephone Encounter (Signed)
Refill request received from Mail order

## 2022-04-05 ENCOUNTER — Ambulatory Visit (INDEPENDENT_AMBULATORY_CARE_PROVIDER_SITE_OTHER): Payer: Medicare Other | Admitting: Dermatology

## 2022-04-05 ENCOUNTER — Ambulatory Visit (INDEPENDENT_AMBULATORY_CARE_PROVIDER_SITE_OTHER): Payer: Medicare Other

## 2022-04-05 DIAGNOSIS — L409 Psoriasis, unspecified: Secondary | ICD-10-CM

## 2022-04-05 DIAGNOSIS — Z5181 Encounter for therapeutic drug level monitoring: Secondary | ICD-10-CM

## 2022-04-05 DIAGNOSIS — Z7901 Long term (current) use of anticoagulants: Secondary | ICD-10-CM

## 2022-04-05 DIAGNOSIS — I480 Paroxysmal atrial fibrillation: Secondary | ICD-10-CM

## 2022-04-05 DIAGNOSIS — L720 Epidermal cyst: Secondary | ICD-10-CM | POA: Diagnosis not present

## 2022-04-05 LAB — POCT INR: INR: 2.2 (ref 2.0–3.0)

## 2022-04-05 MED ORDER — TRIAMCINOLONE ACETONIDE 0.1 % EX CREA: 1.0000 | TOPICAL_CREAM | Freq: Every day | CUTANEOUS | 3 refills | Status: DC | PRN

## 2022-04-05 NOTE — Progress Notes (Unsigned)
   Follow-Up Visit   Subjective  Amy Underwood is a 76 y.o. female who presents for the following: Psoriasis (4 month follow up - she is not using anything because Delos Haring was not covered) and Cyst (Right mid back paraspinal - hurts sometimes/).  The following portions of the chart were reviewed this encounter and updated as appropriate:   Tobacco  Allergies  Meds  Problems  Med Hx  Surg Hx  Fam Hx     Review of Systems:  No other skin or systemic complaints except as noted in HPI or Assessment and Plan.  Objective  Well appearing patient in no apparent distress; mood and affect are within normal limits.  A focused examination was performed including arms, legs. Relevant physical exam findings are noted in the Assessment and Plan.  Elbows, Knees Moderately pink plaques of elbows, less significant of knees. Small plaque of left ear.       Right mid back paraspinal 2.5 cm Subcutaneous nodule.    Assessment & Plan  Psoriasis Elbows, Knees Psoriasis is a chronic non-curable, but treatable genetic/hereditary disease that may have other systemic features affecting other organ systems such as joints (Psoriatic Arthritis). It is associated with an increased risk of inflammatory bowel disease, heart disease, non-alcoholic fatty liver disease, and depression.   Chronic and persistent condition with duration or expected duration over one year. Condition is bothersome/symptomatic for patient. Currently flared. Wynzora cream was not covered by her insurance.  Start TMC 0.1% cream qd 5 days per week. Avoid face, groin, underarms  triamcinolone cream (KENALOG) 0.1 % - Elbows, Knees Apply 1 Application topically daily as needed. Up to 5 days per week. Avoid face, groin, underarms.  Epidermal inclusion cyst Right mid back paraspinal Recommend excision. Patient will schedule surgery appointment. Benign-appearing. Exam most consistent with an epidermal inclusion cyst. Discussed that a  cyst is a benign growth that can grow over time and sometimes get irritated or inflamed. Recommend observation if it is not bothersome. Discussed option of surgical excision to remove it if it is growing, symptomatic, or other changes noted. Please call for new or changing lesions so they can be evaluated.  Return for Surgery for cyst of back then 6 month follow up for psoriasis.  I, Ashok Cordia, CMA, am acting as scribe for Sarina Ser, MD . Documentation: I have reviewed the above documentation for accuracy and completeness, and I agree with the above.  Sarina Ser, MD

## 2022-04-05 NOTE — Patient Instructions (Signed)
TAKE 1.5 TABLETS TODAY ONLY and then  continue warfarin dosage of 1 tablet (5 mg) every day EXCEPT 1/2 TABLET ON MONDAYS AND FRIDAYS - Recheck INR in 1 weeks Continue to have your greens on the same day (or days) each week; EAT LESS GREENS; Coumadin Clinic [phone] [facility]);

## 2022-04-05 NOTE — Patient Instructions (Addendum)
Psoriasis is a chronic non-curable, but treatable genetic/hereditary disease that may have other systemic features affecting other organ systems such as joints (Psoriatic Arthritis). It is associated with an increased risk of inflammatory bowel disease, heart disease, non-alcoholic fatty liver disease, and depression.      Deepika [place] [address] [place] [address]   You have an upcoming surgery appointment scheduled at [facility]. @FUTURESURGAPPT @  PRE-OPERATIVE INSTRUCTIONS  We recommend you read the following instructions. If you have any questions or concerns, please call the office at [phone].  Shower and wash the entire body with soap and water the day of your surgery paying special attention to cleansing at and around the planned surgery site. Avoid aspirin or aspirin containing products at least ten (10) days prior to your surgical procedure and for at least one week (7 days) after your surgical procedure. If you take aspirin on a regular basis for heart disease or history of stroke or for any other reason, we may recommend you continue taking aspirin but please notify us if you take this on a regular basis. Aspirin can cause more bleeding to occur during surgery as well as prolonged bleeding and bruising after surgery. Avoid other nonsteroidal pain medications at least one week prior to surgery and at least one week after your surgery. These include medications such as Ibuprofen (Motrin, Advil and Nuprin), Naprosyn, Voltaren, Relafen, etc. If these medications are used for therapeutic reasons, please inform us as they can cause increased bleeding or prolonged bleeding during and bruising after surgical procedures.  Please advise [place] if you are taking any "blood thinner" medications such as Coumadin or Dipyridamole or Plavix or similar medications. These cause increased bleeding and prolonged bleeding during procedures and bruising after surgical procedures. We  may have to consider discontinuing these medications briefly prior to and shortly after your surgery if safe to do so. Please inform us of all medications you are currently taking. All medications that are taken regularly should be taken the day of surgery as you always do. Nevertheless, we need to be informed of what medications you are taking prior to surgery to know whether they will affect the procedure or cause any complications. Please inform us of any medication allergies. Also inform us of whether you have allergies to Latex or rubber products or whether you have had any adverse reaction to Lidocaine or Epinephrine. Please inform us of any prosthetic or artificial body parts such as artificial heart valve, joint replacements, etc., or similar condition that might require preoperative antibiotics. We recommend avoidance of alcohol at least two weeks prior to surgery and continued avoidance for at least two weeks after surgery. We recommend avoidance of tobacco smoking at least two weeks prior to surgery and continued abstinence for at least two weeks after surgery. Do not plan strenuous exercise, strenuous work or strenuous lifting for approximately four weeks after your surgery. We request if you are unable to make your scheduled surgical appointment, please call us at least a week in advance or as soon as you are aware of a problem so that we can cancel or reschedule the appointment. You MAY TAKE TYLENOL (acetaminophen) for pain as it is not a blood thinner. PLEASE PLAN TO BE IN TOWN FOR TWO WEEKS FOLLOWING SURGERY. THIS IS IMPORTANT SO YOU CAN BE CHECKED FOR DRESSING CHANGES, SUTURE REMOVAL AND TO MONITOR FOR POSSIBLE COMPLICATIONS.   Due to recent changes in healthcare laws, you may see results of your  pathology and/or laboratory studies on MyChart before the doctors have had a chance to review them. We understand that in some cases there may be results that are confusing or concerning to you.  Please understand that not all results are received at the same time and often the doctors may need to interpret multiple results in order to provide you with the best plan of care or course of treatment. Therefore, we ask that you please give [place] 2 business days to thoroughly review all your results before contacting the office for clarification. Should we see a critical lab result, you will be contacted sooner.   If You Need Anything After Your Visit  If you have any questions or concerns for your doctor, please call our main line at [phone] and press option 4 to reach your doctor's medical assistant. If no one answers, please leave a voicemail as directed and we will return your call as soon as possible. Messages left after 4 pm will be answered the following business day.   You may also send [place] a message via [place]. We typically respond to MyChart messages within 1-2 business days.  For prescription refills, please ask your pharmacy to contact our office. Our fax number is [phone].  If you have an urgent issue when the clinic is closed that cannot wait until the next business day, you can page your doctor at the number below.    Please note that while we do our best to be available for urgent issues outside of office hours, we are not available 24/7.   If you have an urgent issue and are unable to reach [place], you may choose to seek medical care at your doctor's office, retail clinic, urgent care center, or emergency room.  If you have a medical emergency, please immediately call 911 or go to the emergency department.  Pager Numbers  - Dr. Nehemiah Massed: [phone]  - Dr. Laurence Ferrari: [phone]  - Dr. Nicole Kindred: [phone]  In the event of inclement weather, please call our main line at [phone] for an update on the status of any delays or closures.  Dermatology Medication Tips: Please keep the boxes that topical medications come in in order to help keep track of the  instructions about where and how to use these. Pharmacies typically print the medication instructions only on the boxes and not directly on the medication tubes.   If your medication is too expensive, please contact our office at [phone] option 4 or send [place] a message through [place].   We are unable to tell what your co-pay for medications will be in advance as this is different depending on your insurance coverage. However, we may be able to find a substitute medication at lower cost or fill out paperwork to get insurance to cover a needed medication.   If a prior authorization is required to get your medication covered by your insurance company, please allow [place] 1-2 business days to complete this process.  Drug prices often vary depending on where the prescription is filled and some pharmacies may offer cheaper prices.  The website www.goodrx.com contains coupons for medications through different pharmacies. The prices here do not account for what the cost may be with help from insurance (it may be cheaper with your insurance), but the website can give you the price if you did not use any insurance.  - You can print the associated coupon and take it with your prescription to the pharmacy.  - You may also stop by  our office during regular business hours and pick up a GoodRx coupon card.  - If you need your prescription sent electronically to a different pharmacy, notify our office through [facility] or by phone at [phone] option 4.     Si Usted Necesita Algo Despus de Su Visita  Tambin puede enviarnos un mensaje a travs de Pharmacist, community. Por lo general respondemos a los mensajes de MyChart en el transcurso de 1 a 2 das hbiles.  Para renovar recetas, por favor pida a su farmacia que se ponga en contacto con nuestra oficina. Harland Dingwall de fax es [place] [phone].  Si tiene un asunto urgente cuando la clnica est cerrada y que no puede esperar hasta el siguiente da hbil,  puede llamar/localizar a su doctor(a) al nmero que aparece a continuacin.   Por favor, tenga en cuenta que aunque hacemos todo lo posible para estar disponibles para asuntos urgentes fuera del horario de [place], no estamos disponibles las 24 horas del da, los 7 das de la [place].   Si tiene un problema urgente y no puede comunicarse con nosotros, puede optar por buscar atencin mdica  en el consultorio de su doctor(a), en una clnica privada, en un centro de atencin urgente o en una sala de emergencias.  Si tiene Engineering geologist, por favor llame inmediatamente al 911 o vaya a la sala de emergencias.  Nmeros de bper  - Dr. Nehemiah Massed: [phone]  - Dra. Moye: [phone]  - Dra. Nicole Kindred: [phone]  En caso de inclemencias del [place], por favor llame a Johnsie Kindred principal al [phone] para una actualizacin sobre el [place] de cualquier retraso o cierre.  Consejos para la medicacin en dermatologa: Por favor, guarde las cajas en las que vienen los medicamentos de uso tpico para ayudarle a seguir las instrucciones sobre dnde y cmo usarlos. Las farmacias generalmente imprimen las instrucciones del medicamento slo en las cajas y no directamente en los tubos del [place].   Si su medicamento es muy caro, por favor, pngase en contacto con Zigmund Daniel llamando al [phone] y presione la opcin 4 o envenos un mensaje a travs de Pharmacist, community.   No podemos decirle cul ser su copago por los medicamentos por adelantado ya que esto es diferente dependiendo de la cobertura de su seguro. Sin embargo, es posible que podamos encontrar un medicamento sustituto a Electrical engineer un formulario para que el seguro cubra el medicamento que se considera necesario.   Si se requiere una autorizacin previa para que su compaa de seguros [place] su medicamento, por favor permtanos de 1 a 2 das hbiles para completar este proceso.  Los precios de los medicamentos varan con  frecuencia dependiendo del Environmental consultant de dnde se surte la receta y alguna farmacias pueden ofrecer precios ms baratos.  El sitio web www.goodrx.com tiene cupones para medicamentos de Airline pilot. Los precios aqu no tienen en cuenta lo que podra costar con la ayuda del seguro (puede ser ms barato con su seguro), pero el sitio web puede darle el precio si no utiliz Research scientist (physical sciences).  - Puede imprimir el cupn correspondiente y llevarlo con su receta a la farmacia.  - Tambin puede pasar por nuestra oficina durante el horario de atencin regular y Charity fundraiser una tarjeta de cupones de GoodRx.  - Si necesita que su receta se enve electrnicamente a una farmacia diferente, informe a nuestra oficina a travs de MyChart de [facility] o por telfono llamando al [phone] y presione la opcin 4.

## 2022-04-06 ENCOUNTER — Encounter: Payer: Self-pay | Admitting: Dermatology

## 2022-04-11 NOTE — Telephone Encounter (Signed)
Received fax requesting refill on Pantoprazole. Looks like was denied when requested last?

## 2022-04-11 NOTE — Telephone Encounter (Signed)
6 months of refills provided of pantoprazole in April 2023. She should have enough through October

## 2022-04-12 ENCOUNTER — Ambulatory Visit (INDEPENDENT_AMBULATORY_CARE_PROVIDER_SITE_OTHER): Payer: Medicare Other

## 2022-04-12 ENCOUNTER — Ambulatory Visit: Payer: Medicare Other | Admitting: Cardiology

## 2022-04-12 DIAGNOSIS — I480 Paroxysmal atrial fibrillation: Secondary | ICD-10-CM | POA: Diagnosis not present

## 2022-04-12 DIAGNOSIS — Z5181 Encounter for therapeutic drug level monitoring: Secondary | ICD-10-CM

## 2022-04-12 DIAGNOSIS — Z7901 Long term (current) use of anticoagulants: Secondary | ICD-10-CM | POA: Diagnosis not present

## 2022-04-12 LAB — POCT INR: INR: 2.1 (ref 2.0–3.0)

## 2022-04-12 NOTE — Patient Instructions (Signed)
TAKE 2 TABLETS TODAY ONLY and then  continue warfarin dosage of 1 tablet (5 mg) every day EXCEPT 1/2 TABLET ON MONDAYS AND FRIDAYS - Recheck INR in 1 weeks Continue to have your greens on the same day (or days) each week; EAT LESS GREENS; Coumadin Clinic [phone] [facility]);

## 2022-04-12 NOTE — Telephone Encounter (Signed)
Called patient gave information. She will reach out to pharmacy and let us know if any further questions.

## 2022-04-18 ENCOUNTER — Telehealth: Payer: Self-pay

## 2022-04-18 NOTE — Telephone Encounter (Signed)
Confirmed procedure date of 04/20/2022. Confirmed arrival time of 5:30AM for procedure time at 7:30AM. Reviewed pre-procedure instructions with patient. The patient understands to call if questions/concerns arise prior to procedure.

## 2022-04-19 ENCOUNTER — Ambulatory Visit (INDEPENDENT_AMBULATORY_CARE_PROVIDER_SITE_OTHER): Payer: Medicare Other

## 2022-04-19 DIAGNOSIS — Z5181 Encounter for therapeutic drug level monitoring: Secondary | ICD-10-CM | POA: Diagnosis not present

## 2022-04-19 DIAGNOSIS — Z7901 Long term (current) use of anticoagulants: Secondary | ICD-10-CM

## 2022-04-19 DIAGNOSIS — I480 Paroxysmal atrial fibrillation: Secondary | ICD-10-CM

## 2022-04-19 LAB — POCT INR: INR: 2.8 (ref 2.0–3.0)

## 2022-04-19 NOTE — Patient Instructions (Signed)
continue warfarin dosage of 1 tablet (5 mg) every day EXCEPT 1/2 TABLET ON MONDAYS AND FRIDAYS - Recheck INR in 6 weeks Continue to have your greens on the same day (or days) each week; EAT LESS GREENS; Coumadin Clinic [phone] [facility]);

## 2022-04-19 NOTE — Anesthesia Preprocedure Evaluation (Signed)
Anesthesia Evaluation  Patient identified by MRN, date of birth, ID band Patient awake    Reviewed: Allergy & Precautions, NPO status , Patient's Chart, lab work & pertinent test results  Airway Mallampati: II  TM Distance: >3 FB Neck ROM: Full    Dental  (+) Chipped,    Pulmonary asthma , former smoker,    Pulmonary exam normal        Cardiovascular hypertension, Pt. on medications Normal cardiovascular exam+ dysrhythmias Atrial Fibrillation      Neuro/Psych negative neurological ROS  negative psych ROS   GI/Hepatic Neg liver ROS, GERD  Medicated and Controlled,  Endo/Other  negative endocrine ROS  Renal/GU Renal disease     Musculoskeletal  (+) Arthritis ,   Abdominal (+) + obese,   Peds  Hematology  (+) Blood dyscrasia, ,   Anesthesia Other Findings Atrial fibrillation  Reproductive/Obstetrics                            Anesthesia Physical Anesthesia Plan  ASA: 3  Anesthesia Plan: General   Post-op Pain Management:    Induction: Intravenous  PONV Risk Score and Plan: 3 and Ondansetron, Dexamethasone and Treatment may vary due to age or medical condition  Airway Management Planned: Oral ETT  Additional Equipment: TEE and ClearSight  Intra-op Plan:   Post-operative Plan: Extubation in OR  Informed Consent: I have reviewed the patients History and Physical, chart, labs and discussed the procedure including the risks, benefits and alternatives for the proposed anesthesia with the patient or authorized representative who has indicated his/her understanding and acceptance.     Dental advisory given  Plan Discussed with: CRNA  Anesthesia Plan Comments: (TEE probe placement only)       Anesthesia Quick Evaluation

## 2022-04-20 ENCOUNTER — Inpatient Hospital Stay (HOSPITAL_COMMUNITY)
Admission: RE | Admit: 2022-04-20 | Discharge: 2022-04-20 | Disposition: A | Discharge status: Home / Self Care | Payer: Medicare Other | Source: Home / Self Care | Attending: Cardiology | Admitting: Cardiology

## 2022-04-20 ENCOUNTER — Other Ambulatory Visit: Payer: Self-pay

## 2022-04-20 ENCOUNTER — Encounter (HOSPITAL_COMMUNITY): Payer: Self-pay | Admitting: Cardiology

## 2022-04-20 ENCOUNTER — Inpatient Hospital Stay (HOSPITAL_COMMUNITY): Payer: Medicare Other

## 2022-04-20 ENCOUNTER — Encounter (HOSPITAL_COMMUNITY)
Admission: RE | Disposition: A | Discharge status: Home / Self Care | Payer: Medicare Other | Source: Home / Self Care | Attending: Cardiology

## 2022-04-20 ENCOUNTER — Inpatient Hospital Stay (HOSPITAL_COMMUNITY): Payer: Medicare Other | Admitting: Certified Registered"

## 2022-04-20 ENCOUNTER — Inpatient Hospital Stay (HOSPITAL_COMMUNITY)
Admission: RE | Admit: 2022-04-20 | Discharge: 2022-04-20 | DRG: 274 | Disposition: A | Discharge status: Home / Self Care | Payer: Medicare Other | Attending: Cardiology | Admitting: Cardiology

## 2022-04-20 DIAGNOSIS — Z95818 Presence of other cardiac implants and grafts: Secondary | ICD-10-CM

## 2022-04-20 DIAGNOSIS — Z7901 Long term (current) use of anticoagulants: Secondary | ICD-10-CM

## 2022-04-20 DIAGNOSIS — Z7984 Long term (current) use of oral hypoglycemic drugs: Secondary | ICD-10-CM | POA: Diagnosis not present

## 2022-04-20 DIAGNOSIS — I4891 Unspecified atrial fibrillation: Secondary | ICD-10-CM | POA: Diagnosis present

## 2022-04-20 DIAGNOSIS — Z006 Encounter for examination for normal comparison and control in clinical research program: Secondary | ICD-10-CM | POA: Diagnosis not present

## 2022-04-20 DIAGNOSIS — Z79899 Other long term (current) drug therapy: Secondary | ICD-10-CM | POA: Diagnosis not present

## 2022-04-20 DIAGNOSIS — I4819 Other persistent atrial fibrillation: Secondary | ICD-10-CM

## 2022-04-20 DIAGNOSIS — E785 Hyperlipidemia, unspecified: Secondary | ICD-10-CM | POA: Diagnosis present

## 2022-04-20 DIAGNOSIS — Z87891 Personal history of nicotine dependence: Secondary | ICD-10-CM | POA: Diagnosis not present

## 2022-04-20 DIAGNOSIS — I480 Paroxysmal atrial fibrillation: Secondary | ICD-10-CM | POA: Diagnosis present

## 2022-04-20 DIAGNOSIS — I10 Essential (primary) hypertension: Secondary | ICD-10-CM | POA: Diagnosis present

## 2022-04-20 DIAGNOSIS — Z96642 Presence of left artificial hip joint: Secondary | ICD-10-CM | POA: Diagnosis present

## 2022-04-20 DIAGNOSIS — Z96653 Presence of artificial knee joint, bilateral: Secondary | ICD-10-CM | POA: Diagnosis present

## 2022-04-20 DIAGNOSIS — D509 Iron deficiency anemia, unspecified: Secondary | ICD-10-CM | POA: Diagnosis present

## 2022-04-20 DIAGNOSIS — Z8249 Family history of ischemic heart disease and other diseases of the circulatory system: Secondary | ICD-10-CM

## 2022-04-20 DIAGNOSIS — N289 Disorder of kidney and ureter, unspecified: Secondary | ICD-10-CM | POA: Diagnosis present

## 2022-04-20 DIAGNOSIS — J45909 Unspecified asthma, uncomplicated: Secondary | ICD-10-CM | POA: Diagnosis present

## 2022-04-20 DIAGNOSIS — L409 Psoriasis, unspecified: Secondary | ICD-10-CM | POA: Diagnosis present

## 2022-04-20 HISTORY — PX: LEFT ATRIAL APPENDAGE OCCLUSION: EP1229

## 2022-04-20 HISTORY — PX: TEE WITHOUT CARDIOVERSION: SHX5443

## 2022-04-20 HISTORY — DX: Presence of other cardiac implants and grafts: Z95.818

## 2022-04-20 LAB — PROTIME-INR
INR: 2.5 — ABNORMAL HIGH (ref 0.8–1.2)
Prothrombin Time: 27.1 seconds — ABNORMAL HIGH (ref 11.4–15.2)

## 2022-04-20 LAB — POCT ACTIVATED CLOTTING TIME: Activated Clotting Time: 414 seconds

## 2022-04-20 LAB — TYPE AND SCREEN
ABO/RH(D): A POS
Antibody Screen: NEGATIVE

## 2022-04-20 LAB — SURGICAL PCR SCREEN
MRSA, PCR: NEGATIVE
Staphylococcus aureus: NEGATIVE

## 2022-04-20 LAB — ABO/RH: ABO/RH(D): A POS

## 2022-04-20 SURGERY — LEFT ATRIAL APPENDAGE OCCLUSION
Anesthesia: General

## 2022-04-20 MED ORDER — SODIUM CHLORIDE 0.9 % IV SOLN: 250.0000 mL | INTRAVENOUS | Status: DC | PRN

## 2022-04-20 MED ORDER — ORAL CARE MOUTH RINSE: 15.0000 mL | OROMUCOSAL | Status: DC | PRN

## 2022-04-20 MED ORDER — ACETAMINOPHEN 500 MG PO TABS
1000.0000 mg | ORAL_TABLET | Freq: Once | ORAL | Status: AC
  Administered 2022-04-20: 1000 mg via ORAL
  Filled 2022-04-20: qty 2

## 2022-04-20 MED ORDER — CHLORHEXIDINE GLUCONATE 0.12 % MT SOLN
15.0000 mL | Freq: Once | OROMUCOSAL | Status: AC
  Administered 2022-04-20: 15 mL via OROMUCOSAL
  Filled 2022-04-20 (×2): qty 15

## 2022-04-20 MED ORDER — FENTANYL CITRATE (PF) 100 MCG/2ML IJ SOLN
INTRAMUSCULAR | Status: DC | PRN
  Administered 2022-04-20: 100 ug via INTRAVENOUS

## 2022-04-20 MED ORDER — ROCURONIUM BROMIDE 10 MG/ML (PF) SYRINGE
PREFILLED_SYRINGE | INTRAVENOUS | Status: DC | PRN
  Administered 2022-04-20: 50 mg via INTRAVENOUS

## 2022-04-20 MED ORDER — SUGAMMADEX SODIUM 200 MG/2ML IV SOLN
INTRAVENOUS | Status: DC | PRN
  Administered 2022-04-20: 200 mg via INTRAVENOUS

## 2022-04-20 MED ORDER — PROPOFOL 10 MG/ML IV BOLUS
INTRAVENOUS | Status: DC | PRN
  Administered 2022-04-20 (×3): 50 mg via INTRAVENOUS

## 2022-04-20 MED ORDER — HEPARIN (PORCINE) IN NACL 1000-0.9 UT/500ML-% IV SOLN
INTRAVENOUS | Status: AC
  Filled 2022-04-20: qty 500

## 2022-04-20 MED ORDER — PHENYLEPHRINE HCL-NACL 20-0.9 MG/250ML-% IV SOLN
INTRAVENOUS | Status: DC | PRN
  Administered 2022-04-20: 40 ug/min via INTRAVENOUS

## 2022-04-20 MED ORDER — AMLODIPINE BESYLATE 10 MG PO TABS
10.0000 mg | ORAL_TABLET | Freq: Once | ORAL | Status: DC
Start: 2022-04-20 — End: 2022-04-20
  Filled 2022-04-20: qty 2

## 2022-04-20 MED ORDER — DEXAMETHASONE SODIUM PHOSPHATE 10 MG/ML IJ SOLN
INTRAMUSCULAR | Status: DC | PRN
  Administered 2022-04-20: 8 mg via INTRAVENOUS

## 2022-04-20 MED ORDER — SODIUM CHLORIDE 0.9% FLUSH
3.0000 mL | Freq: Two times a day (BID) | INTRAVENOUS | Status: DC
  Administered 2022-04-20: 3 mL via INTRAVENOUS

## 2022-04-20 MED ORDER — SODIUM CHLORIDE 0.9% FLUSH: 3.0000 mL | INTRAVENOUS | Status: DC | PRN

## 2022-04-20 MED ORDER — HEPARIN (PORCINE) IN NACL 2000-0.9 UNIT/L-% IV SOLN
INTRAVENOUS | Status: AC
Start: 2022-04-20 — End: ?
  Filled 2022-04-20: qty 1000

## 2022-04-20 MED ORDER — HEPARIN SODIUM (PORCINE) 1000 UNIT/ML IJ SOLN
INTRAMUSCULAR | Status: DC | PRN
  Administered 2022-04-20: 8000 [IU] via INTRAVENOUS

## 2022-04-20 MED ORDER — LOSARTAN POTASSIUM 50 MG PO TABS
100.0000 mg | ORAL_TABLET | Freq: Once | ORAL | Status: DC
  Filled 2022-04-20: qty 2

## 2022-04-20 MED ORDER — LACTATED RINGERS IV SOLN: INTRAVENOUS | Status: DC | PRN

## 2022-04-20 MED ORDER — VANCOMYCIN HCL IN DEXTROSE 1-5 GM/200ML-% IV SOLN
1000.0000 mg | INTRAVENOUS | Status: AC
  Administered 2022-04-20: 1000 mg via INTRAVENOUS
  Filled 2022-04-20: qty 200

## 2022-04-20 MED ORDER — FENTANYL CITRATE (PF) 100 MCG/2ML IJ SOLN
INTRAMUSCULAR | Status: AC
  Filled 2022-04-20: qty 2

## 2022-04-20 MED ORDER — PHENYLEPHRINE HCL-NACL 20-0.9 MG/250ML-% IV SOLN
INTRAVENOUS | Status: AC
  Filled 2022-04-20: qty 1000

## 2022-04-20 MED ORDER — SODIUM CHLORIDE 0.9 % IV SOLN: INTRAVENOUS | Status: DC

## 2022-04-20 MED ORDER — PROTAMINE SULFATE 10 MG/ML IV SOLN
INTRAVENOUS | Status: DC | PRN
  Administered 2022-04-20: 35 mg via INTRAVENOUS

## 2022-04-20 MED ORDER — HEPARIN (PORCINE) IN NACL 2000-0.9 UNIT/L-% IV SOLN
INTRAVENOUS | Status: DC | PRN
  Administered 2022-04-20: 1000 mL

## 2022-04-20 MED ORDER — DOFETILIDE 125 MCG PO CAPS
125.0000 ug | ORAL_CAPSULE | Freq: Two times a day (BID) | ORAL | Status: DC
  Filled 2022-04-20: qty 1

## 2022-04-20 MED ORDER — LIDOCAINE 2% (20 MG/ML) 5 ML SYRINGE
INTRAMUSCULAR | Status: DC | PRN
  Administered 2022-04-20: 60 mg via INTRAVENOUS

## 2022-04-20 MED ORDER — HEPARIN (PORCINE) IN NACL 1000-0.9 UT/500ML-% IV SOLN
INTRAVENOUS | Status: DC | PRN
  Administered 2022-04-20: 500 mL

## 2022-04-20 MED ORDER — ACETAMINOPHEN 325 MG PO TABS: 650.0000 mg | ORAL_TABLET | ORAL | Status: DC | PRN

## 2022-04-20 MED ORDER — IOHEXOL 350 MG/ML SOLN
INTRAVENOUS | Status: DC | PRN
  Administered 2022-04-20 (×3): 5 mL

## 2022-04-20 MED ORDER — ONDANSETRON HCL 4 MG/2ML IJ SOLN
INTRAMUSCULAR | Status: DC | PRN
  Administered 2022-04-20: 4 mg via INTRAVENOUS

## 2022-04-20 MED ORDER — ONDANSETRON HCL 4 MG/2ML IJ SOLN: 4.0000 mg | Freq: Four times a day (QID) | INTRAMUSCULAR | Status: DC | PRN

## 2022-04-20 MED ORDER — DOFETILIDE 125 MCG PO CAPS
125.0000 ug | ORAL_CAPSULE | Freq: Once | ORAL | Status: AC
Start: 2022-04-20 — End: 2022-04-20
  Administered 2022-04-20: 125 ug via ORAL
  Filled 2022-04-20: qty 1

## 2022-04-20 MED ORDER — PHENYLEPHRINE HCL (PRESSORS) 10 MG/ML IV SOLN
INTRAVENOUS | Status: DC | PRN
  Administered 2022-04-20 (×3): 80 ug via INTRAVENOUS

## 2022-04-20 SURGICAL SUPPLY — 21 items
BLANKET WARM UNDERBOD FULL ACC (MISCELLANEOUS) ×2 IMPLANT
CATH DIAG 6FR PIGTAIL ANGLED (CATHETERS) ×1 IMPLANT
CLOSURE PERCLOSE PROSTYLE (VASCULAR PRODUCTS) ×2 IMPLANT
DEVICE WATCHMAN FLX PROC (KITS) IMPLANT
DILATOR VESSEL 38 20CM 11FR (INTRODUCER) ×1 IMPLANT
KIT HEART LEFT (KITS) ×2 IMPLANT
KIT SHEA VERSACROSS LAAC CONNE (KITS) ×1 IMPLANT
MAT PREVALON FULL STRYKER (MISCELLANEOUS) ×1 IMPLANT
PACK CARDIAC CATHETERIZATION (CUSTOM PROCEDURE TRAY) ×2 IMPLANT
PAD DEFIB RADIO PHYSIO CONN (PAD) ×2 IMPLANT
SHEATH PERFORMER 16FR 30 (SHEATH) ×1 IMPLANT
SHEATH PINNACLE 8F 10CM (SHEATH) ×1 IMPLANT
SHEATH PROBE COVER 6X72 (BAG) ×2 IMPLANT
SYS WATCHMAN FXD DBL (SHEATH) ×2
SYSTEM WATCHMAN FXD DBL (SHEATH) IMPLANT
TRANSDUCER W/STOPCOCK (MISCELLANEOUS) ×2 IMPLANT
TUBING CIL FLEX 10 FLL-RA (TUBING) ×2 IMPLANT
WATCHMAN FLX 27 (Prosthesis & Implant Heart) ×1 IMPLANT
WATCHMAN FLX 31 (Prosthesis & Implant Heart) ×1 IMPLANT
WATCHMAN FLX PROCEDURE DEVICE (KITS) ×2 IMPLANT
WATCHMAN PROCED TRUSEAL ACCESS (SHEATH) ×1 IMPLANT

## 2022-04-20 NOTE — Progress Notes (Signed)
  [place] TEAM  Patient doing well s/p Watchman implant. She is hemodynamically stable. Groin site stable. Plan for early ambulation after bedrest completed and hopeful discharge in the next several hours.   [name] NP-C Structural Heart Team  Pager: [phone] Phone: [phone]

## 2022-04-20 NOTE — Progress Notes (Signed)
  Echocardiogram Echocardiogram Transesophageal has been performed.  Amy Underwood 04/20/2022, 9:08 AM

## 2022-04-20 NOTE — Transfer of Care (Signed)
Immediate Anesthesia Transfer of Care Note  Patient: Amy Underwood  Procedure(s) Performed: LEFT ATRIAL APPENDAGE OCCLUSION TRANSESOPHAGEAL ECHOCARDIOGRAM (TEE)  Patient Location: Cath Lab  Anesthesia Type:General  Level of Consciousness: awake, alert  and patient cooperative  Airway & Oxygen Therapy: Patient Spontanous Breathing and Patient connected to nasal cannula oxygen  Post-op Assessment: Report given to RN and Post -op Vital signs reviewed and stable  Post vital signs: Reviewed and stable    Last Vitals:  Vitals Value Taken Time  BP 139/116 04/20/22 0907  Temp 36.7 C 04/20/22 0909  Pulse 47 04/20/22 0911  Resp 14 04/20/22 0911  SpO2 99 % 04/20/22 0911  Vitals shown include unvalidated device data.  Last Pain:  Vitals:   04/20/22 0909  TempSrc: Temporal  PainSc: 0-No pain         Complications: There were no known notable events for this encounter.

## 2022-04-20 NOTE — TOC Progression Note (Signed)
Transition of Care [facility]) - Progression Note    Patient Details  Name: Marriah Sanderlin MRN: [id number] Date of Birth: 14-May-1946  Transition of Care [facility]) CM/SW [place], RN Phone [phone]  04/20/2022, 1:33 PM  Clinical Narrative:     Transition of Care (TOC) Screening Note   Patient Details  Name: Yachet Mattson Date of Birth: 10/26/1945   Transition of Care [facility]) CM/SW Contact:    Angelita Ingles, RN Phone Number: 04/20/2022, 1:34 PM    Transition of Care Department [facility]) has reviewed patient and no TOC needs have been identified at this time. We will continue to monitor patient advancement through interdisciplinary progression rounds. If new patient transition needs arise, please place a TOC consult.          Expected Discharge Plan and Services           Expected Discharge Date: 04/20/22                                     Social Determinants of Health (SDOH) Interventions    Readmission Risk Interventions     No data to display

## 2022-04-20 NOTE — Discharge Summary (Signed)
[facility]    Patient ID: Amy Underwood,  MRN: [id number], DOB/AGE: February 04, 1946 76 y.o.  Admit date: 04/20/2022 Discharge date: 04/20/2022  Primary Care Physician: Amy Koch, NP  Primary Cardiologist: None  Electrophysiologist: Amy Epley, MD  Primary Discharge Diagnosis:  Paroxysmal Atrial Fibrillation Poor candidacy for long term anticoagulation due to  difficulty with maintaining therapeutic INR ranges with warfarin  Secondary Discharge Diagnosis:  -HTN -HLD -psoriasis -anemia -IDA  Procedures This Admission:  Transeptal Puncture Intra-procedural TEE which showed no LAA thrombus Left atrial appendage occlusive device placement on 04/20/22 by Dr. Quentin Ore.   This study demonstrated:  CONCLUSIONS:  1.Successful implantation of a WATCHMAN left atrial appendage occlusive device    2. TEE demonstrating no LAA thrombus 3. No early apparent complications.    Post Implant Anticoagulation Strategy: Continue Coumadin for 45 days after implant and then transition to Plavix 75mg  by mouth daily to complete 6 months of post implant therapy. Plan for CT imaging at 60 days post implant to assess Watchman positioning and endothelialization.    Brief HPI: Amy Underwood is a 76 y.o. female with a history of paroxysmal atrial fibrillation s/p ablation 01/2022, HTN, HLD, psoriasis, anemia, and IDA. The patient was initially diagnosed with atrial fibrillation remotely and has been maintained on dofetilide. She underwent afib and flutter ablation 01/03/22 and has been taking warfarin for anticoagulation for a CHADS2VASC score of 3. She was felt to be a poor candidate for long-term anticoagulation due to issues with cost and maintaining therapeutic ranges of INRs while on warfarin. She was seen by Dr. Quentin Ore with plans to pursue Watchman workup. The risks, benefits, and alternatives to left atrial appendage occlusive device placement device were reviewed with the patient  and the patient wished to proceed. The patient was underwent pre procedure CT imaging which showed anatomy suitable for Watchman implant.  Hospital Course:  The patient was admitted and underwent left atrial appendage occlusive device placement with 23mm Watchman device. She was monitored post procedure on telemetry which demonstrated NSR. Groin site has remained stable. Given her stability, she is being considered for same day discharge. Wound care and restrictions were reviewed with the patient and sister who sis at bedside. The patient has been scheduled for post procedure follow up with Amy Drown, NP in approximately one month. Medication plan will be to resume Coumadin for 45 days after implant and then transition to Plavix 75mg  by mouth daily to complete 6 months of post implant therapy. Plan for CT imaging at 60 days post implant to assess Watchman positioning and endothelialization. She will need SBE prior to dental procedures and cleanings for the next 6 months. This was discussed and will be RX'ed at follow up.   Physical Exam: Vitals:   04/20/22 0939 04/20/22 0940 04/20/22 1004 04/20/22 1128  BP:  113/64 126/86 119/60  Pulse:  67 71 70  Resp:  12 14 14   Temp: 98.5 F (36.9 C)  97.8 F (36.6 C) 97.7 F (36.5 C)  TempSrc: Temporal  Oral Axillary  SpO2:  95% 93% 94%  Weight:      Height:       General: Well developed, well nourished, NAD Lungs:Clear to ausculation bilaterally. Breathing is unlabored. Cardiovascular: RRR with S1 S2. No murmurs Extremities: No edema.  Neuro: Alert and oriented. No focal deficits. No facial asymmetry. MAE spontaneously. Psych: Responds to questions appropriately with normal affect.    Labs:   Lab Results  Component  Value Date   WBC 5.1 03/27/2022   HGB 13.1 03/27/2022   HCT 39.1 03/27/2022   MCV 91 03/27/2022   PLT 157 03/27/2022   No results for input(s): "NA", "K", "CL", "CO2", "BUN", "CREATININE", "CALCIUM", "PROT", "BILITOT",  "ALKPHOS", "ALT", "AST", "GLUCOSE" in the last 168 hours.  Invalid input(s): "LABALBU"   Discharge Medications:  Allergies as of 04/20/2022       Reactions   Cefaclor Hives, Rash        Medication List     TAKE these medications    albuterol 108 (90 Base) MCG/ACT inhaler Commonly known as: VENTOLIN HFA Inhale 1-2 puffs into the lungs every 6 (six) hours as needed for wheezing or shortness of breath.   allopurinol 100 MG tablet Commonly known as: ZYLOPRIM Take 100 mg by mouth at bedtime.   amLODipine 10 MG tablet Commonly known as: NORVASC Take 1 tablet (10 mg total) by mouth daily.   atorvastatin 20 MG tablet Commonly known as: LIPITOR Take 1 tablet (20 mg total) by mouth daily. for cholesterol. Office visit required in late September for further refills.   cyanocobalamin 1000 MCG tablet Take 1,000 mcg by mouth daily. Vit B12   Dialyvite Vitamin D 5000 125 MCG (5000 UT) capsule Generic drug: Cholecalciferol Take 5,000 Units by mouth daily.   dofetilide 125 MCG capsule Commonly known as: TIKOSYN Take 1 capsule (125 mcg total) by mouth 2 (two) times daily.   Farxiga 10 MG Tabs tablet Generic drug: dapagliflozin propanediol Take 10 mg by mouth every morning.   ferrous sulfate 325 (65 FE) MG tablet Take 650 mg by mouth See admin instructions. Every three weeks for one week   losartan 100 MG tablet Commonly known as: COZAAR Take 1 tablet (100 mg total) by mouth daily. for blood pressure. Office visit required in late September for further refills.   MAGnesium-Oxide 400 (240 Mg) MG tablet Generic drug: magnesium oxide TAKE 1 TABLET(400 MG) BY MOUTH DAILY   pantoprazole 40 MG tablet Commonly known as: PROTONIX Take 1 tablet by mouth daily for heartburn.   triamcinolone cream 0.1 % Commonly known as: KENALOG Apply 1 Application topically daily as needed. Up to 5 days per week. Avoid face, groin, underarms.   warfarin 5 MG tablet Commonly known as:  COUMADIN Take as directed. If you are unsure how to take this medication, talk to your nurse or doctor. Original instructions: Take 1/2 tablet to 1 tablet daily or as directed by Anticoagulation Clinic. What changed:  how much to take how to take this when to take this additional instructions        Disposition: Home  Discharge Instructions     Diet - low sodium heart healthy   Complete by: As directed    Discharge instructions   Complete by: As directed    [facility] Procedure, Care After  Procedure MD: Dr. Benson Norway Clinical Coordinator: Amy Llamas, RN  This sheet gives you information about how to care for yourself after your procedure. Your health care provider may also give you more specific instructions. If you have problems or questions, contact your health care provider.  What can I expect after the procedure? After the procedure, it is common to have: Bruising around your puncture site. Tenderness around your puncture site. Tiredness (fatigue).  Medication instructions It is very important to continue to take your blood thinner as directed by your doctor after the Watchman procedure. Call your procedure doctor's office with question or concerns. If you  are on Coumadin (warfarin), you will have your INR checked the week after your procedure, with a goal INR of 2.0 - 3.0. Please follow your medication instructions on your discharge summary. Only take the medications listed on your discharge paperwork.  Follow up You will be seen in 1 month after your procedure You will have another CT scan approximately 6 weeks after your procedure mark to check your device You will follow up the MD/APP who performed your procedure 6 months after your procedure The Watchman Clinical Coordinator will check in with you from time to time, including 1 and 2 years after your procedure.    Follow these instructions at home: Puncture site care  Follow instructions from your health  care provider about how to take care of your puncture site. Make sure you: If present, leave stitches (sutures), skin glue, or adhesive strips in place.  If a large square bandage is present, this may be removed 24 hours after surgery.  Check your puncture site every day for signs of infection. Check for: Redness, swelling, or pain. Fluid or blood. If your puncture site starts to bleed, lie down on your back, apply firm pressure to the area, and contact your health care provider. Warmth. Pus or a bad smell. Driving Do not drive yourself home if you received sedation Do not drive for at least 4 days after your procedure or however long your health care provider recommends. (Do not resume driving if you have previously been instructed not to drive for other health reasons.) Do not spend greater than 1 hour at a time in a car for the first 3 days. Stop and take a break with a 5 minute walk at least every hour.  Do not drive or use heavy machinery while taking prescription pain medicine.  Activity Avoid activities that take a lot of effort, including exercise, for at least 7 days after your procedure. For the first 3 days, avoid sitting for longer than one hour at a time.  Avoid alcoholic beverages, signing paperwork, or participating in legal proceedings for 24 hours after receiving sedation Do not lift anything that is heavier than 10 lb (4.5 kg) for one week.  No sexual activity for 1 week.  Return to your normal activities as told by your health care provider. Ask your health care provider what activities are safe for you. General instructions Take over-the-counter and prescription medicines only as told by your health care provider. Do not use any products that contain nicotine or tobacco, such as cigarettes and e-cigarettes. If you need help quitting, ask your health care provider. You may shower after 24 hours, but Do not take baths, swim, or use a hot tub for 1 week.  Do not drink  alcohol for 24 hours after your procedure. Keep all follow-up visits as told by your health care provider. This is important. Dental Work: You will require antibiotics prior to any dental work, including cleanings, for 6 months after your Watchman implantation to help protect you from infection. After 6 months, antibiotics are no longer required. Contact a health care provider if: You have redness, mild swelling, or pain around your puncture site. You have soreness in your throat or at your puncture site that does not improve after several days You have fluid or blood coming from your puncture site that stops after applying firm pressure to the area. Your puncture site feels warm to the touch. You have pus or a bad smell coming from your puncture site.  You have a fever. You have chest pain or discomfort that spreads to your neck, jaw, or arm. You are sweating a lot. You feel nauseous. You have a fast or irregular heartbeat. You have shortness of breath. You are dizzy or light-headed and feel the need to lie down. You have pain or numbness in the arm or leg closest to your puncture site. Get help right away if: Your puncture site suddenly swells. Your puncture site is bleeding and the bleeding does not stop after applying firm pressure to the area. These symptoms may represent a serious problem that is an emergency. Do not wait to see if the symptoms will go away. Get medical help right away. Call your local emergency services (911 in the [place] Do not drive yourself to the hospital. Summary After the procedure, it is normal to have bruising and tenderness at the puncture site in your groin, neck, or forearm. Check your puncture site every day for signs of infection. Get help right away if your puncture site is bleeding and the bleeding does not stop after applying firm pressure to the area. This is a medical emergency.  This information is not intended to replace advice given to you by your  health care provider. Make sure you discuss any questions you have with your health care provider.   Increase activity slowly   Complete by: As directed        Follow-up Information     Tommie Raymond, NP Follow up on 05/22/2022.   Specialty: Cardiology Why: @ 11am. Please arrive to your appointment by 10:45am Contact information: [place] [address] [phone]                Duration of Discharge Encounter: Greater than 30 minutes including physician time.  Signed, Amy Drown, NP  04/20/2022 12:45 PM

## 2022-04-20 NOTE — Anesthesia Procedure Notes (Signed)
Procedure Name: Intubation Date/Time: 04/20/2022 7:58 AM  Performed by: Moshe Salisbury, CRNAPre-anesthesia Checklist: Patient identified, Emergency Drugs available, Suction available and Patient being monitored Patient Re-evaluated:Patient Re-evaluated prior to induction Oxygen Delivery Method: Circle system utilized Preoxygenation: Pre-oxygenation with 100% oxygen Induction Type: IV induction Ventilation: Mask ventilation without difficulty Laryngoscope Size: Mac and 3 Grade View: Grade I Tube type: Oral Number of attempts: 1 Airway Equipment and Method: Stylet Placement Confirmation: ETT inserted through vocal cords under direct vision, positive ETCO2 and breath sounds checked- equal and bilateral Secured at: 20 cm Tube secured with: Tape Dental Injury: Teeth and Oropharynx as per pre-operative assessment  Comments: Lynnea Ferrier, SRNA

## 2022-04-20 NOTE — Anesthesia Postprocedure Evaluation (Signed)
Anesthesia Post Note  Patient: Amy Underwood  Procedure(s) Performed: LEFT ATRIAL APPENDAGE OCCLUSION TRANSESOPHAGEAL ECHOCARDIOGRAM (TEE)     Patient location during evaluation: PACU Anesthesia Type: General Level of consciousness: awake Pain management: pain level controlled Vital Signs Assessment: post-procedure vital signs reviewed and stable Respiratory status: spontaneous breathing, nonlabored ventilation, respiratory function stable and patient connected to nasal cannula oxygen Cardiovascular status: blood pressure returned to baseline and stable Postop Assessment: no apparent nausea or vomiting Anesthetic complications: no   There were no known notable events for this encounter.  Last Vitals:  Vitals:   04/20/22 1004 04/20/22 1128  BP: 126/86 119/60  Pulse: 71 70  Resp: 14 14  Temp: 36.6 C 36.5 C  SpO2: 93% 94%    Last Pain:  Vitals:   04/20/22 1154  TempSrc:   PainSc: 0-No pain                 Prentiss Polio P Fardowsa Authier

## 2022-04-20 NOTE — H&P (Signed)
Electrophysiology Office Follow up Visit Note:     Date:  04/20/2022    ID:  Amy Underwood, DOB 06/19/1946, MRN [id number]   PCP:  Pleas Koch, NP    [facility] Electrophysiologist:  Vickie Epley, MD      Interval History:     Amy Underwood is a 76 y.o. female who presents for a follow up visit. They were last seen in clinic 07/06/2021 for AF. She is maintained on dofetilide 151mcg (dose reduced 2/2 renal dysfunction). She was previously cared for by Dr Rosezella Florida in [place]. Ablation was discussed at our last appointment. She wanted some time to think about her options before finalizing a treatment plan. She called back to schedule an ablation and is currently scheduled for Jan 03, 2022. She is on coumadin but is interested in changing anticoagulants to xarelto.  She has had a lot of trouble keeping her INR is in range and she is very motivated to get off anticoagulants long-term.  She tells me that it really impacts her lifestyle.  She cannot afford NOAC.  She continues to have breakthrough episodes of atrial fibrillation despite treatment with dofetilide.    Today she presents for Watchman implant.      Objective      Past Medical History:  Diagnosis Date   Adhesive capsulitis of right shoulder     Asthma     Hyperlipidemia     Hypertension     Iron deficiency anemia     Osteoarthritis     Paroxysmal atrial fibrillation (HCC)     Pernicious anemia     Psoriasis             Past Surgical History:  Procedure Laterality Date   CESAREAN SECTION        3   CHOLECYSTECTOMY       HIP ARTHROPLASTY Left     KNEE ARTHROPLASTY Bilateral        Current Medications: Active Medications      Current Meds  Medication Sig   albuterol (VENTOLIN HFA) 108 (90 Base) MCG/ACT inhaler Inhale 1-2 puffs into the lungs every 6 (six) hours as needed for wheezing or shortness of breath.   atorvastatin (LIPITOR) 20 MG tablet atorvastatin 20 mg tablet  TAKE 1 TABLET BY MOUTH   DAILY   calcipotriene (DOVONOX) 0.005 % cream calcipotriene 0.005 % topical cream   Calcipotriene-Betameth Diprop (WYNZORA) 0.005-0.064 % CREA Apply to affected skin once a day   Cholecalciferol 1.25 MG (50000 UT) capsule cholecalciferol (vitamin D3) 1,250 mcg (50,000 unit) capsule  Take 1 capsule every week by oral route.   clobetasol cream (TEMOVATE) 0.05 % clobetasol 0.05 % topical cream  APPLY TO AFFECTED AREA OF BODY TWICE A  DAY FOR 2 WEEKS OUT OF THE MONTH AS NEEDED FOR FLARES EXCLUDE FACE AND GENITAL   cyanocobalamin 1000 MCG tablet Vitamin B-12  1,000 mcg tablet  Take 1 tablet every day by oral route.   diltiazem (CARDIZEM CD) 240 MG 24 hr capsule Take 240 mg by mouth daily.   dofetilide (TIKOSYN) 125 MCG capsule dofetilide 125 mcg capsule   FARXIGA 10 MG TABS tablet Take 10 mg by mouth every morning.   ferrous sulfate 325 (65 FE) MG tablet every 14 (fourteen) days.   furosemide (LASIX) 20 MG tablet Take by mouth as needed.   losartan (COZAAR) 100 MG tablet losartan 100 mg tablet   magnesium oxide (MAG-OX) 400 MG tablet magnesium 400 mg (as magnesium oxide) tablet  Take 1 tablet twice a day by oral route.   Multiple Vitamin (MULTI-VITAMIN) tablet Take 1 tablet by mouth every morning.   niacin 500 MG CR capsule niacin  500 mg daily   pantoprazole (PROTONIX) 40 MG tablet pantoprazole 40 mg tablet,delayed release   warfarin (COUMADIN) 5 MG tablet Take 1 tablet (5 mg total) by mouth daily.        Allergies:   Cefaclor    Social History         Socioeconomic History   Marital status: Widowed      Spouse name: Not on file   Number of children: Not on file   Years of education: Not on file   Highest education level: Not on file  Occupational History   Not on file  Tobacco Use   Smoking status: Former      Types: Cigarettes      Start date: 2001   Smokeless tobacco: Never  Vaping Use   Vaping Use: Never used  Substance and Sexual Activity   Alcohol use: Not on file    Drug use: Never   Sexual activity: Not Currently  Other Topics Concern   Not on file  Social History Narrative   Not on file    Social Determinants of Health    Financial Resource Strain: Not on file  Food Insecurity: Not on file  Transportation Needs: Not on file  Physical Activity: Not on file  Stress: Not on file  Social Connections: Not on file      Family History: The patient's family history includes Heart disease in her mother.   ROS:   Please see the history of present illness.    All other systems reviewed and are negative.   EKGs/Labs/Other Studies Reviewed:     The following studies were reviewed today:        Recent Labs: 05/25/2021: ALT 20; Hemoglobin 12.7; Platelets 160.0 06/02/2021: BUN 45; Creatinine, Ser 1.84; Potassium 5.2 No hemolysis seen; Sodium 139  Recent Lipid Panel Labs (Brief)          Component Value Date/Time    CHOL 154 05/25/2021 1047    TRIG 133.0 05/25/2021 1047    HDL 60.60 05/25/2021 1047    CHOLHDL 3 05/25/2021 1047    VLDL 26.6 05/25/2021 1047    LDLCALC 66 05/25/2021 1047        Physical Exam:     VS:  BP 169/99 (BP Location: Left Arm, Patient Position: Sitting, Cuff Size: Large)   Pulse 76   Ht 5\' 3"  (1.6 m)   Wt 186 lb (84.4 kg)   SpO2 98%   BMI 32.95 kg/m         Wt Readings from Last 3 Encounters:  11/30/21 186 lb (84.4 kg)  10/25/21 189 lb 3.2 oz (85.8 kg)  10/19/21 189 lb (85.7 kg)      GEN:  Well nourished, well developed in no acute distress HEENT: Normal NECK: No JVD; No carotid bruits LYMPHATICS: No lymphadenopathy CARDIAC: RRR, no murmurs, rubs, gallops RESPIRATORY:  Clear to auscultation without rales, wheezing or rhonchi  ABDOMEN: Soft, non-tender, non-distended MUSCULOSKELETAL:  No edema; No deformity  SKIN: Warm and dry NEUROLOGIC:  Alert and oriented x 3 PSYCHIATRIC:  Normal affect            Assessment ASSESSMENT:     1. Paroxysmal atrial fibrillation (HCC)   2. Essential  hypertension   3. Encounter for long-term (current) use of high-risk medication  PLAN:     In order of problems listed above:   #Persistent atrial fibrillation Doing well after ablation. Remains on dofetilide. Today presents for watchman implant given her desire to discontinue anticoagulation. She has trouble maintaining a therapeutic INR and can not afford NOAC.   -------------------------   I have seen Albertina Senegal in the office today who is being considered for a Watchman left atrial appendage closure device. I believe they will benefit from this procedure given their history of atrial fibrillation, CHA2DS2-VASc score of 3 and unadjusted ischemic stroke rate of 3.2% per year. The patient's chart has been reviewed and I feel that they would be a candidate for short term oral anticoagulation after Watchman implant.    It is my belief that after undergoing a LAA closure procedure, Valerya Maxton will not need long term anticoagulation which eliminates anticoagulation side effects and major bleeding risk.    Procedural risks for the Watchman implant have been reviewed with the patient including a 0.5% risk of stroke, <1% risk of perforation and <1% risk of device embolization.      The published clinical data on the safety and effectiveness of WATCHMAN include but are not limited to the following: - Holmes DR, Mechele Claude, Sick P et al. for the PROTECT AF Investigators. Percutaneous closure of the left atrial appendage versus warfarin therapy for prevention of stroke in patients with atrial fibrillation: a randomised non-inferiority trial. Lancet 2009; 374: 534-42. Mechele Claude, Doshi SK, Abelardo Diesel D et al. on behalf of the PROTECT AF Investigators. Percutaneous Left Atrial Appendage Closure for Stroke Prophylaxis in Patients With Atrial Fibrillation 2.3-Year Follow-up of the PROTECT AF (Watchman Left Atrial Appendage System for Embolic Protection in Patients With Atrial Fibrillation) Trial.  Circulation 2013; 127:720-729. - Alli O, Doshi S,  Kar S, Reddy VY, Sievert H et al. Quality of Life Assessment in the Randomized PROTECT AF (Percutaneous Closure of the Left Atrial Appendage Versus Warfarin Therapy for Prevention of Stroke in Patients With Atrial Fibrillation) Trial of Patients at Risk for Stroke With Nonvalvular Atrial Fibrillation. J Am Coll Cardiol 2013; 08:6761-9. Vertell Limber DR, Tarri Abernethy, Price M, [place], Sievert H, Doshi S, Huber K, Reddy V. Prospective randomized evaluation of the Watchman left atrial appendage Device in patients with atrial fibrillation versus long-term warfarin therapy; the PREVAIL trial. Journal of the [employer] of Cardiology, Vol. 4, No. 1, 2014, 1-11. - Kar S, Doshi SK, Sadhu A, Horton R, Osorio J et al. Primary outcome evaluation of a next-generation left atrial appendage closure device: results from the PINNACLE FLX trial. Circulation 2021;143(18)1754-1762.      After today's visit with the patient which was dedicated solely for shared decision making visit regarding LAA closure device, the patient decided to proceed with the LAA appendage closure procedure scheduled to be done in the near future at [facility]. Prior to the procedure, I would like to obtain a gated CT scan of the chest with contrast timed for PV/LA visualization.    HAS-BLED score 3 Hypertension Yes  Abnormal renal and liver function (Dialysis, transplant, Cr >2.26 mg/dL /Cirrhosis or Bilirubin >2x Normal or AST/ALT/AP >3x Normal) No  Stroke No  Bleeding No  Labile INR (Unstable/high INR) Yes  Elderly (>65) Yes  Drugs or alcohol (? 8 drinks/week, anti-plt or NSAID) No    CHA2DS2-VASc Score = 3  The patient's score is based upon: CHF History: 0 HTN History: 1 Diabetes History: 0 Stroke History: 0 Vascular Disease  History: 0 Age Score: 1 Gender Score: 1        Signed, Lars Mage, MD, [facility], [facility] 04/20/2022 Electrophysiology [facility]

## 2022-04-20 NOTE — Progress Notes (Signed)
Only give tikosyn this morning per Dr. Quentin Ore. Pt did not take instructed medications this morning before arriving.

## 2022-04-20 NOTE — Progress Notes (Signed)
Reviewed AVS instructions including followup appointments and medications with patient and patient's sister at bedside using teach-back method.  All questions answered.  PIV removed without incidence.   Patient alert and oriented on room air.  R femoral vascular site clean and dry.   Patient taken to private vehicle via wheelchair with RN present.

## 2022-04-21 ENCOUNTER — Telehealth: Payer: Self-pay

## 2022-04-21 NOTE — Telephone Encounter (Signed)
  [place] Team  Contacted the patient regarding discharge from [facility] on 04/20/2022  The patient understands to follow up with [name], NP on 05/22/2022 in preparation for CT on 06/22/2022.  The patient understands discharge instructions? Yes  The patient understands medications and regimen? Yes   The patient reports groin sites look healthy with no signs/symptoms of bleeding or infection.  The patient understands to call with any questions or concerns prior to scheduled visit.

## 2022-05-16 ENCOUNTER — Encounter: Payer: Medicare Other | Admitting: Dermatology

## 2022-05-22 ENCOUNTER — Telehealth: Payer: Self-pay | Admitting: Primary Care

## 2022-05-22 ENCOUNTER — Ambulatory Visit: Payer: Medicare Other

## 2022-05-22 DIAGNOSIS — K219 Gastro-esophageal reflux disease without esophagitis: Secondary | ICD-10-CM

## 2022-05-22 NOTE — Telephone Encounter (Signed)
Called pt & schedule ov for 06/01/22 @ 9am

## 2022-05-22 NOTE — Telephone Encounter (Signed)
Patient due for office visit now. This is required for further refills.  Please schedule.

## 2022-05-23 NOTE — Progress Notes (Unsigned)
[facility]                                     Cardiology Office Note:    Date:  05/24/2022   ID:  Amy Underwood, DOB Jan 28, 1946, MRN [id number]  PCP:  Pleas Koch, NP  [facility] Cardiologist:  None  [facility] Electrophysiologist:  Vickie Epley, MD   Referring MD: Pleas Koch, NP   Chief Complaint  Patient presents with   Follow-up    [place] follow up   History of Present Illness:    Amy Underwood is a 76 y.o. female with a hx of paroxysmal atrial fibrillation s/p ablation 01/2022, HTN, HLD, psoriasis, anemia, and IDA. The patient was initially diagnosed with atrial fibrillation remotely and has been maintained on dofetilide. She underwent afib and flutter ablation 01/03/22 and has been taking warfarin for anticoagulation for a CHADS2VASC score of 3. She was felt to be a poor candidate for long-term anticoagulation due to issues with cost and maintaining therapeutic ranges of INRs while on warfarin. She was seen by Dr. Quentin Ore with plans to pursue Watchman workup. The risks, benefits, and alternatives to left atrial appendage occlusive device placement device were reviewed with the patient and the patient wished to proceed. The patient was underwent pre procedure CT imaging which showed anatomy suitable for Watchman implant.  She is now s/p device placement with 58mm Watchman. Medication plan will be to resume Coumadin for 45 days after implant and then transition to Plavix 75mg  by mouth daily to complete 6 months of post implant therapy. Plan for CT imaging at 60 days, scheduled for 10/19. She will need SBE prior to dental procedures and cleanings for the next 6 months. This was discussed and she wishes to defer dental cleanings for now.   Today she reports that she is doing well with no complaints. She denies chest pain, palpitations, orthopnea, LE edema, dizziness, or syncope. No bleeding in stool or urine.   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 Quentin Ore   Psoriasis     Past Surgical History:  Procedure Laterality Date   ATRIAL FIBRILLATION ABLATION N/A 01/03/2022   Procedure: ATRIAL FIBRILLATION ABLATION;  Surgeon: Vickie Epley, MD;  Location: [place] [facility];  Service: Cardiovascular;  Laterality: N/A;   CESAREAN SECTION     3   CHOLECYSTECTOMY     HIP ARTHROPLASTY Left    KNEE ARTHROPLASTY Bilateral    LEFT ATRIAL APPENDAGE OCCLUSION N/A 04/20/2022   Procedure: LEFT ATRIAL APPENDAGE OCCLUSION;  Surgeon: Vickie Epley, MD;  Location: [place] [facility];  Service: Cardiovascular;  Laterality: N/A;   TEE WITHOUT CARDIOVERSION N/A 04/20/2022   Procedure: TRANSESOPHAGEAL ECHOCARDIOGRAM (TEE);  Surgeon: Vickie Epley, MD;  Location: [place] [facility];  Service: Cardiovascular;  Laterality: N/A;    Current Medications: Current Meds  Medication Sig   albuterol (VENTOLIN HFA) 108 (90 Base) MCG/ACT inhaler Inhale 1-2 puffs into the lungs every 6 (six) hours as needed for wheezing or shortness of breath.  allopurinol (ZYLOPRIM) 100 MG tablet Take 100 mg by mouth at bedtime.   amLODipine (NORVASC) 10 MG tablet Take 1 tablet (10 mg total) by mouth daily.   atorvastatin (LIPITOR) 20 MG tablet Take 1 tablet (20 mg total) by mouth daily. for cholesterol. Office visit required in late September for further refills.   Cholecalciferol (DIALYVITE VITAMIN D 5000) 125 MCG (5000 UT) capsule Take 5,000 Units by mouth daily.   clopidogrel (PLAVIX) 75 MG tablet Take 1 tablet (75 mg total) by mouth daily. START ON 06/05/22 AND STOP WARFARIN ON 06/04/22   cyanocobalamin 1000 MCG tablet Take 1,000 mcg by mouth daily. Vit B12   dofetilide  (TIKOSYN) 125 MCG capsule Take 1 capsule (125 mcg total) by mouth 2 (two) times daily.   FARXIGA 10 MG TABS tablet Take 10 mg by mouth every morning.   ferrous sulfate 325 (65 FE) MG tablet Take 650 mg by mouth See admin instructions. Every three weeks for one week   losartan (COZAAR) 100 MG tablet Take 1 tablet (100 mg total) by mouth daily. for blood pressure. Office visit required in late September for further refills.   MAGnesium-Oxide 400 (240 Mg) MG tablet TAKE 1 TABLET(400 MG) BY MOUTH DAILY   pantoprazole (PROTONIX) 40 MG tablet Take 1 tablet by mouth daily for heartburn. Office visit required for further refills.   triamcinolone cream (KENALOG) 0.1 % Apply 1 Application topically daily as needed. Up to 5 days per week. Avoid face, groin, underarms.   warfarin (COUMADIN) 5 MG tablet Take 1/2 tablet to 1 tablet daily or as directed by Anticoagulation Clinic. (Patient taking differently: Take 5 mg by mouth at bedtime. or as directed by Anticoagulation Clinic.)     Allergies:   Cefaclor   Social History   Socioeconomic History   Marital status: Widowed    Spouse name: Not on file   Number of children: Not on file   Years of education: Not on file   Highest education level: Not on file  Occupational History   Not on file  Tobacco Use   Smoking status: Former    Types: Cigarettes    Start date: 2001   Smokeless tobacco: Never  Vaping Use   Vaping Use: Never used  Substance and Sexual Activity   Alcohol use: Not on file   Drug use: Never   Sexual activity: Not Currently  Other Topics Concern   Not on file  Social History Narrative   Not on file   Social Determinants of Health   Financial Resource Strain: Low Risk  (04/03/2022)   Overall Financial Resource Strain (CARDIA)    Difficulty of Paying Living Expenses: Not hard at all  Food Insecurity: No Food Insecurity (04/03/2022)   Hunger Vital Sign    Worried About Running Out of Food in the Last Year: Never true    Ran Out  of Food in the Last Year: Never true  Transportation Needs: No Transportation Needs (04/03/2022)   PRAPARE - Hydrologist (Medical): No    Lack of Transportation (Non-Medical): No  Physical Activity: Insufficiently Active (04/03/2022)   Exercise Vital Sign    Days of Exercise per Week: 4 days    Minutes of Exercise per Session: 30 min  Stress: No Stress Concern Present (04/03/2022)   [place]    Feeling of Stress : Not at all  Social Connections: Socially Isolated (04/03/2022)   Social Connection and  Isolation Panel [NHANES]    Frequency of Communication with Friends and Family: Three times a week    Frequency of Social Gatherings with Friends and Family: Three times a week    Attends Religious Services: Never    Active Member of Clubs or Organizations: No    Attends Archivist Meetings: Never    Marital Status: Widowed    Family History: The patient's family history includes Heart disease in her mother.  ROS:   Please see the history of present illness.    All other systems reviewed and are negative.  EKGs/Labs/Other Studies Reviewed:    EKG:  EKG is not ordered today.    Recent Labs: 05/25/2021: ALT 20 11/30/2021: Magnesium 1.9 03/27/2022: BUN 30; Creatinine, Ser 1.44; Hemoglobin 13.1; Platelets 157; Potassium 4.6; Sodium 142  Recent Lipid Panel    Component Value Date/Time   CHOL 154 05/25/2021 1047   TRIG 133.0 05/25/2021 1047   HDL 60.60 05/25/2021 1047   CHOLHDL 3 05/25/2021 1047   VLDL 26.6 05/25/2021 1047   LDLCALC 66 05/25/2021 1047   Physical Exam:    VS:  BP (!) 148/80   Pulse 72   Ht 5\' 3"  (1.6 m)   Wt 178 lb (80.7 kg)   SpO2 97%   BMI 31.53 kg/m     Wt Readings from Last 3 Encounters:  05/24/22 178 lb (80.7 kg)  04/20/22 175 lb (79.4 kg)  03/27/22 182 lb (82.6 kg)    General: Well developed, well nourished, NAD Neck: Negative for carotid bruits.  No JVD Lungs:Clear to ausculation bilaterally. Cardiovascular: RRR with S1 S2. No murmurs Extremities: No edema.  Neuro: Alert and oriented. No focal deficits. No facial asymmetry. MAE spontaneously. Psych: Responds to questions appropriately with normal affect.    ASSESSMENT/PLAN:    PAF: s/p LAAO with 81mm Watchman device. Doing well today with no complaints. She was continued on Coumadin with plans to transition to Plavix 06/05/22. This was communicated with the patient today. She will continue this for 6 months post implant. She is scheduled for CT imaging at 10/19 to assess positioning and endothelialization. She will need SBE prior to dental procedures and cleanings for the next 6 months however she wishes to defer cleanings at this time. Obtain BMET, CBC today.   Medication Adjustments/Labs and Tests Ordered: Current medicines are reviewed at length with the patient today.  Concerns regarding medicines are outlined above.  Orders Placed This Encounter  Procedures   Basic metabolic panel   CBC   Meds ordered this encounter  Medications   clopidogrel (PLAVIX) 75 MG tablet    Sig: Take 1 tablet (75 mg total) by mouth daily. START ON 06/05/22 AND STOP WARFARIN ON 06/04/22    Dispense:  90 tablet    Refill:  3    Patient Instructions  Medication Instructions:  Your physician has recommended you make the following change in your medication:  STOP WARFARIN ON 06/04/22 START PLAVIX ON 06/05/22   *If you need a refill on your cardiac medications before your next appointment, please call your pharmacy*   Lab Work: TODAY: BMET, CBC If you have labs (blood work) drawn today and your tests are completely normal, you will receive your results only by: [place] (if you have MyChart) OR A paper copy in the mail If you have any lab test that is abnormal or we need to change your treatment, we will call you to review the results.   Testing/Procedures: SEE WATCHMAN  INSTRUCTION LETTER     Follow-Up: At [facility], you and your health needs are our priority.  As part of our continuing mission to provide you with exceptional heart care, we have created designated Provider Care Teams.  These Care Teams include your primary Cardiologist (physician) and Advanced Practice Providers (APPs -  Physician Assistants and Nurse Practitioners) who all work together to provide you with the care you need, when you need it.  We recommend signing up for the patient portal called "MyChart".  Sign up information is provided on this After Visit Summary.  MyChart is used to connect with patients for Virtual Visits (Telemedicine).  Patients are able to view lab/test results, encounter notes, upcoming appointments, etc.  Non-urgent messages can be sent to your provider as well.   To learn more about what you can do with MyChart, go to [email or web address].    Your next appointment:   6 month(s)  The format for your next appointment:   In Person  Provider:   [name], NP  Important Information About Sugar         Signed, [name], NP  05/24/2022 10:53 AM    [place]

## 2022-05-24 ENCOUNTER — Ambulatory Visit: Payer: Medicare Other | Attending: Cardiology | Admitting: Cardiology

## 2022-05-24 VITALS — BP 148/80 | HR 72 | Ht 63.0 in | Wt 178.0 lb

## 2022-05-24 DIAGNOSIS — I480 Paroxysmal atrial fibrillation: Secondary | ICD-10-CM | POA: Insufficient documentation

## 2022-05-24 DIAGNOSIS — Z95818 Presence of other cardiac implants and grafts: Secondary | ICD-10-CM | POA: Diagnosis present

## 2022-05-24 DIAGNOSIS — Z7901 Long term (current) use of anticoagulants: Secondary | ICD-10-CM | POA: Insufficient documentation

## 2022-05-24 DIAGNOSIS — I10 Essential (primary) hypertension: Secondary | ICD-10-CM | POA: Insufficient documentation

## 2022-05-24 LAB — CBC
Hematocrit: 39.5 % (ref 34.0–46.6)
Hemoglobin: 13 g/dL (ref 11.1–15.9)
MCH: 30.7 pg (ref 26.6–33.0)
MCHC: 32.9 g/dL (ref 31.5–35.7)
MCV: 93 fL (ref 79–97)
Platelets: 168 10*3/uL (ref 150–450)
RBC: 4.23 x10E6/uL (ref 3.77–5.28)
RDW: 14.3 % (ref 11.7–15.4)
WBC: 5.8 10*3/uL (ref 3.4–10.8)

## 2022-05-24 LAB — BASIC METABOLIC PANEL
BUN/Creatinine Ratio: 22 (ref 12–28)
BUN: 29 mg/dL — ABNORMAL HIGH (ref 8–27)
CO2: 26 mmol/L (ref 20–29)
Calcium: 9.4 mg/dL (ref 8.7–10.3)
Chloride: 107 mmol/L — ABNORMAL HIGH (ref 96–106)
Creatinine, Ser: 1.31 mg/dL — ABNORMAL HIGH (ref 0.57–1.00)
Glucose: 123 mg/dL — ABNORMAL HIGH (ref 70–99)
Potassium: 4.2 mmol/L (ref 3.5–5.2)
Sodium: 142 mmol/L (ref 134–144)
eGFR: 43 mL/min/{1.73_m2} — ABNORMAL LOW (ref >59)

## 2022-05-24 MED ORDER — CLOPIDOGREL BISULFATE 75 MG PO TABS: 75.0000 mg | ORAL_TABLET | Freq: Every day | ORAL | 3 refills | Status: DC

## 2022-05-24 NOTE — Patient Instructions (Signed)
Medication Instructions:  Your physician has recommended you make the following change in your medication:  STOP WARFARIN ON 06/04/22 START PLAVIX ON 06/05/22   *If you need a refill on your cardiac medications before your next appointment, please call your pharmacy*   Lab Work: TODAY: BMET, CBC If you have labs (blood work) drawn today and your tests are completely normal, you will receive your results only by: [place] (if you have MyChart) OR A paper copy in the mail If you have any lab test that is abnormal or we need to change your treatment, we will call you to review the results.   Testing/Procedures: SEE WATCHMAN INSTRUCTION LETTER    Follow-Up: At [facility], you and your health needs are our priority.  As part of our continuing mission to provide you with exceptional heart care, we have created designated Provider Care Teams.  These Care Teams include your primary Cardiologist (physician) and Advanced Practice Providers (APPs -  Physician Assistants and Nurse Practitioners) who all work together to provide you with the care you need, when you need it.  We recommend signing up for the patient portal called "MyChart".  Sign up information is provided on this After Visit Summary.  MyChart is used to connect with patients for Virtual Visits (Telemedicine).  Patients are able to view lab/test results, encounter notes, upcoming appointments, etc.  Non-urgent messages can be sent to your provider as well.   To learn more about what you can do with MyChart, go to [email or web address].    Your next appointment:   6 month(s)  The format for your next appointment:   In Person  Provider:   [name], NP  Important Information About Sugar

## 2022-05-25 ENCOUNTER — Encounter: Payer: Self-pay | Admitting: Family Medicine

## 2022-05-25 NOTE — Telephone Encounter (Signed)
If she has no sensation in her hand or arm, she should probably be seen tomorrow.  Aside from Tylenol with her medications and problems, I could not recommend anything more until someone can check her out.

## 2022-05-31 ENCOUNTER — Other Ambulatory Visit: Payer: Self-pay | Admitting: Primary Care

## 2022-05-31 ENCOUNTER — Ambulatory Visit: Payer: Medicare Other | Attending: Cardiology

## 2022-05-31 DIAGNOSIS — I480 Paroxysmal atrial fibrillation: Secondary | ICD-10-CM | POA: Insufficient documentation

## 2022-05-31 DIAGNOSIS — Z5181 Encounter for therapeutic drug level monitoring: Secondary | ICD-10-CM | POA: Diagnosis not present

## 2022-05-31 DIAGNOSIS — Z7901 Long term (current) use of anticoagulants: Secondary | ICD-10-CM | POA: Insufficient documentation

## 2022-05-31 DIAGNOSIS — I10 Essential (primary) hypertension: Secondary | ICD-10-CM

## 2022-05-31 DIAGNOSIS — E782 Mixed hyperlipidemia: Secondary | ICD-10-CM

## 2022-05-31 LAB — POCT INR: INR: 2.4 (ref 2.0–3.0)

## 2022-05-31 NOTE — Patient Instructions (Signed)
STOP COUMADIN 10/1

## 2022-06-01 ENCOUNTER — Encounter: Payer: Self-pay | Admitting: Primary Care

## 2022-06-01 ENCOUNTER — Ambulatory Visit (INDEPENDENT_AMBULATORY_CARE_PROVIDER_SITE_OTHER): Payer: Medicare Other | Admitting: Primary Care

## 2022-06-01 VITALS — BP 156/84 | HR 65 | Temp 98.6°F | Ht 63.0 in | Wt 181.0 lb

## 2022-06-01 DIAGNOSIS — M19012 Primary osteoarthritis, left shoulder: Secondary | ICD-10-CM

## 2022-06-01 DIAGNOSIS — J4520 Mild intermittent asthma, uncomplicated: Secondary | ICD-10-CM

## 2022-06-01 DIAGNOSIS — K219 Gastro-esophageal reflux disease without esophagitis: Secondary | ICD-10-CM | POA: Diagnosis not present

## 2022-06-01 DIAGNOSIS — M19011 Primary osteoarthritis, right shoulder: Secondary | ICD-10-CM

## 2022-06-01 DIAGNOSIS — Z1231 Encounter for screening mammogram for malignant neoplasm of breast: Secondary | ICD-10-CM

## 2022-06-01 DIAGNOSIS — E782 Mixed hyperlipidemia: Secondary | ICD-10-CM

## 2022-06-01 DIAGNOSIS — N184 Chronic kidney disease, stage 4 (severe): Secondary | ICD-10-CM

## 2022-06-01 DIAGNOSIS — M109 Gout, unspecified: Secondary | ICD-10-CM | POA: Diagnosis not present

## 2022-06-01 DIAGNOSIS — E2839 Other primary ovarian failure: Secondary | ICD-10-CM

## 2022-06-01 DIAGNOSIS — I10 Essential (primary) hypertension: Secondary | ICD-10-CM

## 2022-06-01 DIAGNOSIS — Z23 Encounter for immunization: Secondary | ICD-10-CM

## 2022-06-01 DIAGNOSIS — I480 Paroxysmal atrial fibrillation: Secondary | ICD-10-CM

## 2022-06-01 LAB — LIPID PANEL
Cholesterol: 164 mg/dL (ref 0–200)
HDL: 84.6 mg/dL (ref >39.00)
LDL Cholesterol: 63 mg/dL (ref 0–99)
NonHDL: 79.77
Total CHOL/HDL Ratio: 2
Triglycerides: 82 mg/dL (ref 0.0–149.0)
VLDL: 16.4 mg/dL (ref 0.0–40.0)

## 2022-06-01 LAB — URIC ACID: Uric Acid, Serum: 4.1 mg/dL (ref 2.4–7.0)

## 2022-06-01 MED ORDER — PANTOPRAZOLE SODIUM 20 MG PO TBEC: 20.0000 mg | DELAYED_RELEASE_TABLET | Freq: Every day | ORAL | 0 refills | Status: DC

## 2022-06-01 MED ORDER — PREDNISONE 20 MG PO TABS: ORAL_TABLET | ORAL | 0 refills | Status: DC

## 2022-06-01 NOTE — Assessment & Plan Note (Addendum)
Controlled.   Reviewed notes and labs from cardiology.   Continue Warfarin until 06/04/22. Start Plavix on 06/05/22 as recommended by cardiologist.  Patient understands these instructions.  Continue metoprolol succinate 50 mg daily.   I evaluated patient, was consulted regarding treatment, and agree with assessment and plan per Tinnie Gens, RN, DNP student.   Allie Bossier, NP-C

## 2022-06-01 NOTE — Assessment & Plan Note (Addendum)
Acute on chronic flare up of arthritis on bilateral shoulder.  Reviewed Dr. Lillie Fragmin notes from June 2023.  Given her history, she would benefit from the steroid injection. During interm, will start Prednisone 20 mg two tablets daily for four days and then one tablet daily for four days until she can get an appointment with Dr. Edilia Bo.    Will continue to monitor.   I evaluated patient, was consulted regarding treatment, and agree with assessment and plan per Tinnie Gens, RN, DNP student.   Allie Bossier, NP-C

## 2022-06-01 NOTE — Progress Notes (Signed)
Established Patient Office Visit  Subjective   Patient ID: Amy Underwood, female    DOB: [birth year]  Age: 76 y.o. MRN: [id number]  Chief Complaint  Patient presents with   Medication Refill         Medication Refill Associated symptoms include weakness. Pertinent negatives include no chest pain, chills, fever, nausea or vomiting.    Estela Vinal is a 76 year old female with past medical history of HTN, paroxysmal atrial fibrillation, asthma, GERD, HLD, s/p watchman device presents today for a follow up on chronic health issues.   HTN, Paroxysmal afib, HLD: Followed by cardiology. Currently taking Losartan 100 mg daily, Amlodipine 10 mg daily. Atorvastatin 20 mg daily. Farxiga 10 mg daily. Dofetilide 125 mcg twice daily. Warfarin 5 mg daily, which she will stop on 06/04/22. She will start Plavix 75 mg daily on 06/05/22. S/p ablation in May 2023 and watchman device placement in August 2023. No recent Afib events. Mentioned that cardiology is working on weaning her off. GERD: She is currently taking pantoprazole 40 mg daily. She feels that her symptoms are well managed and feels that it would be ok to either discontinue or decrease the dosage.  Gout: Followed up nephrologist. Currently on allopurinol 100 mg daily. No concerns at this time.  Shoulder pain, bilateral: started a week ago. Has numbness, tingling and burning radiating to her arms. It is causing difficulty with ADLs and difficulty driving. The pain is keeping her awake at night. Denies any injuries or trauma. She has been taking tylenol which gave her some relief. Same thing happened 3-4 months ago and saw Dr. Edilia Bo and received steroid injection which resolved her pain within 24 hours. T Immunizations: -Tetanus: completed in 2017. -Influenza: due. -Shingles: completed.  -Pneumonia: completed.  Mammogram: Due; would like to wait until next year.  Colonoscopy: Due. Declines colonoscopy and cologuard at this time.   Dexa: Due;  would like to wait until next year.     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   Trigger finger of left hand 07/18/2021   Anemia in chronic kidney disease 07/11/2021   Benign hypertensive kidney disease with chronic kidney disease 07/11/2021   Chronic kidney disease, stage IV (severe) ([place]) 07/11/2021   Hyperkalemia 07/11/2021   Adhesive capsulitis of right shoulder 10/26/2020   Mixed hyperlipidemia 07/05/2020   Chronic anticoagulation 10/02/2019   History of revision of total replacement of right hip joint 07/28/2019   Paroxysmal atrial fibrillation ([place]) 01/08/2019   Heart murmur 12/18/2016   Asthma 09/23/2014   Essential hypertension 09/23/2014   Gastroesophageal reflux disease 09/23/2014   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 Quentin Ore   Psoriasis  Past Surgical History:  Procedure Laterality Date   ATRIAL FIBRILLATION ABLATION N/A 01/03/2022   Procedure: ATRIAL FIBRILLATION ABLATION;  Surgeon: Vickie Epley, MD;  Location: [place] [facility];  Service: Cardiovascular;  Laterality: N/A;   CESAREAN SECTION     3   CHOLECYSTECTOMY     HIP ARTHROPLASTY Left    KNEE ARTHROPLASTY Bilateral     LEFT ATRIAL APPENDAGE OCCLUSION N/A 04/20/2022   Procedure: LEFT ATRIAL APPENDAGE OCCLUSION;  Surgeon: Vickie Epley, MD;  Location: [place] [facility];  Service: Cardiovascular;  Laterality: N/A;   TEE WITHOUT CARDIOVERSION N/A 04/20/2022   Procedure: TRANSESOPHAGEAL ECHOCARDIOGRAM (TEE);  Surgeon: Vickie Epley, MD;  Location: [place] [facility];  Service: Cardiovascular;  Laterality: N/A;   Social History   Tobacco Use   Smoking status: Former    Types: Cigarettes    Start date: 2001   Smokeless tobacco: Never  Vaping Use   Vaping Use: Never used  Substance Use Topics   Drug use: Never   Family History  Problem Relation Age of Onset   Heart disease Mother    Allergies  Allergen Reactions   Cefaclor Hives and Rash      Review of Systems  Constitutional:  Negative for chills, fever and malaise/fatigue.  Respiratory:  Negative for shortness of breath and wheezing.   Cardiovascular:  Negative for chest pain.  Gastrointestinal:  Negative for constipation, nausea and vomiting.  Musculoskeletal:  Positive for joint pain.  Neurological:  Positive for tingling and weakness.  Psychiatric/Behavioral: Negative.        Objective:     BP (!) 156/84   Pulse 65   Temp 98.6 F (37 C) (Temporal)   Ht 5\' 3"  (1.6 m)   Wt 181 lb (82.1 kg)   SpO2 99%   BMI 32.06 kg/m  BP Readings from Last 3 Encounters:  06/01/22 (!) 156/84  05/24/22 (!) 148/80  04/20/22 119/60   Wt Readings from Last 3 Encounters:  06/01/22 181 lb (82.1 kg)  05/24/22 178 lb (80.7 kg)  04/20/22 175 lb (79.4 kg)      Physical Exam Vitals reviewed.  Constitutional:      Appearance: Normal appearance.  Cardiovascular:     Rate and Rhythm: Normal rate and regular rhythm.     Pulses: Normal pulses.     Heart sounds: Normal heart sounds.  Pulmonary:     Effort: Pulmonary effort is normal.     Breath sounds: Normal breath sounds.  Abdominal:     General: Bowel sounds are normal.   Musculoskeletal:        General: Tenderness present.     Right shoulder: Tenderness present. Decreased range of motion.     Left shoulder: Tenderness present. Decreased range of motion.  Neurological:     General: No focal deficit present.     Mental Status: She is alert and oriented to person, place, and time.  Psychiatric:        Mood and Affect: Mood normal.        Behavior: Behavior normal.      No results found for any visits on 06/01/22.     The 10-year ASCVD risk score (Arnett DK, et al., 2019) is: 26.1%    Assessment & Plan:   Problem List Items Addressed This Visit       Cardiovascular and Mediastinum   Essential hypertension    Controlled.   Continue Losartan 100 mg daily and Amlodipine 10 mg daily  Paroxysmal atrial fibrillation (HCC)    Controlled.   Reviewed notes and labs from cardiology.   Continue Warfarin until 06/04/22. Start Plavix on 06/05/22 as recommended by cardiologist.           Digestive   Gastroesophageal reflux disease    Controlled.   Agreeable to reduce dose to Pantoprazole 20 mg daily.       Relevant Medications   pantoprazole (PROTONIX) 20 MG tablet     Musculoskeletal and Integument   Podagra   Relevant Medications   predniSONE (DELTASONE) 20 MG tablet   Other Relevant Orders   Uric acid   Arthritis of both glenohumeral joints - Primary    Acute on chronic flare up of arthritis on bilateral shoulder.   Given her history, she would benefit from the steroid injection. During interm, will start Prednisone 20 mg two tablets daily for four days and then one tablet daily for four days until she can get an appointment with Dr. Edilia Bo.    Will continue to monitor.       Relevant Medications   predniSONE (DELTASONE) 20 MG tablet     Other   Mixed hyperlipidemia    Repeat lipid panel pending.   Continue Atorvastatin 20 mg daily.      Relevant Orders   Lipid panel   Other Visit Diagnoses     Encounter  for screening mammogram for malignant neoplasm of breast       Relevant Orders   MM 3D SCREEN BREAST BILATERAL   Estrogen deficiency       Relevant Orders   DG Bone Density   Need for immunization against influenza       Relevant Orders   Flu Vaccine QUAD High Dose(Fluad) (Completed)       No follow-ups on file.    Tinnie Gens, BSN-RN, DNP STUDENT

## 2022-06-01 NOTE — Assessment & Plan Note (Signed)
Controlled.  Infrequent use of albuterol inhaler. Continue albuterol inhaler PRN

## 2022-06-01 NOTE — Assessment & Plan Note (Signed)
Following with nephrology.  Reviewed BMP from September 2023.

## 2022-06-01 NOTE — Assessment & Plan Note (Signed)
Repeat uric acid level pending. Continue allopurinol 100 mg daily per nephrology

## 2022-06-01 NOTE — Assessment & Plan Note (Addendum)
Controlled. Asymptomatic for years.  Discussed a dose reduction Pantoprazole 20 mg daily, she agrees and will update if symptoms return.   I evaluated patient, was consulted regarding treatment, and agree with assessment and plan per Tinnie Gens, RN, DNP student.   Allie Bossier, NP-C

## 2022-06-01 NOTE — Patient Instructions (Addendum)
Take prednisone 2 tablets by mouth daily for four days and then take one tablet for four days. It is ok to also take tylenol as needed for pain.   Finish out the pantoprazole 40 mg and then start 20 mg once daily. Let me know if symptoms worsen.   Stop by the lab prior to leaving today. I will notify you of your results once received.   It was a pleasure to see you today!  Referral has been sent for mammogram and bone density. You can call and schedule whenever you ready.   You will schedule  an appointment scheduled for: []   2D Mammogram  [x]   3D Mammogram  [x]   Bone Density     Your appointment will at the following location  [x]   [facility]  [place] [address]  [phone]  []   [place] at [facility])   [address] Room [place], [place] [address]  [phone]  []   [facility]      [address] [place], [place]         []   [facility]  [place], [place]  [x]  [facility]   [address], [place] [address]  []  [facility]  [place] [place],  [address] [phone]    Make sure to wear two piece clothing  No lotions powders or deodorants the day of the appointment Make sure to bring picture ID and insurance card.  Bring list of medications you are currently taking including any supplements.   Schedule your screening mammogram through MyChart!   Select [place] imaging sites can now be scheduled through [place].  Log into your MyChart account.  Go to 'Visit' (or 'Appointments' if  on mobile App) --> Schedule an  Appointment  Under 'Select a Reason for Visit' choose the Mammogram  Screening option.  Complete the pre-visit  questions  and select the time and place that  best fits your schedule

## 2022-06-01 NOTE — Assessment & Plan Note (Addendum)
Controlled.   Continue Losartan 100 mg daily and Amlodipine 10 mg daily. Reviewed BMP from September 2023  I evaluated patient, was consulted regarding treatment, and agree with assessment and plan per Tinnie Gens, RN, DNP student.   Allie Bossier, NP-C

## 2022-06-01 NOTE — Assessment & Plan Note (Addendum)
Repeat lipid panel pending.   Continue Atorvastatin 20 mg daily.  I evaluated patient, was consulted regarding treatment, and agree with assessment and plan per Tinnie Gens, RN, DNP student.   Allie Bossier, NP-C

## 2022-06-01 NOTE — Progress Notes (Signed)
Subjective:    Patient ID: Amy Underwood, female    DOB: [birth year], 76 y.o.   MRN: [id number]  Medication Refill Pertinent negatives include no arthralgias, chest pain or headaches.    Amy Underwood is a very pleasant 76 y.o. female with a history of atrial fibrillation s/p ablation in May 2023 and Watchman device placement in August 2023, gout, asthma, hypertension, hyperlipidemia in who presents today   Immunizations: -Tetanus: 2016 -Influenza: Due today  -Shingles: Completed Shingrix and Zostavax -Pneumonia: Prevnar 13 in 2016 and pneumovax in 2018  Mammogram: Completed years ago  Colonoscopy: Due, declines  Dexa: Due, declines   1) Hypertension/Atrial Fibrillation/Hyperlipidemia: Currently managed on amlodipine 10 mg daily, losartan 100 mg daily, atorvastatin 20 mg daily, Tikosyn 125 mg twice daily, Farxiga 10 mg daily, warfarin. Following with cardiology and electrophysiology, last office visit with cardiology was 05/24/22. She is S/P Watchman device placement, scheduled for CT in October. Plan is to start clopidogrel 75 mg daily on 06/05/22 and stop warfarin 06/04/22.   She denies chest pain, palpitations, dizziness.   BP Readings from Last 3 Encounters:  06/01/22 (!) 156/84  05/24/22 (!) 148/80  04/20/22 119/60     2) Gout: Evaluated in April 2023 per Dr. Danise Mina for acute toe pain. Uric acid was 6.8 at the time. She was initiated on allopurinol 100 mg daily at the time.  Today she has continued allopurinol 100 mg, receives refills from nephrology. No recent gout flares.   3) GERD: Currently managed on pantoprazole 40 mg daily and is doing very well. She denies breakthrough GERD symptoms.    4) Asthma: Currently managed on albuterol inhaler PRN. Overall feels well controlled, no recent use of albuterol inhaler.   5) CKD Stage IV: Following with nephrology through [facility].   6) Shoulder Pain: Chronic with acute on chronic flare. Acute symptoms  began one week ago to the right shoulder and then a few days ago to the left shoulder. Also with radiation of pain with numbness down bilateral upper extremities.   Evaluated by Dr. Lorelei Pont for same symptoms in June 2023, was provided with steroid injections bilaterally and received significant improvement until recently.   She denies acute trauma. She's been taking Tylenol with some improvement.    Review of Systems  Respiratory:  Negative for shortness of breath.   Cardiovascular:  Negative for chest pain and palpitations.  Musculoskeletal:  Negative for arthralgias.  Neurological:  Negative for dizziness and headaches.         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 Quentin Ore   Psoriasis     Social History   Socioeconomic History   Marital status: Widowed    Spouse name: Not on file   Number of children: Not on file   Years of education: Not on file   Highest education level: Not on file  Occupational History   Not on file  Tobacco Use   Smoking status: Former    Types: Cigarettes    Start date: 2001   Smokeless tobacco: Never  Vaping Use   Vaping Use: Never used  Substance and Sexual Activity   Alcohol use:  Not on file   Drug use: Never   Sexual activity: Not Currently  Other Topics Concern   Not on file  Social History Narrative   Not on file   Social Determinants of Health   Financial Resource Strain: Low Risk  (04/03/2022)   Overall Financial Resource Strain (CARDIA)    Difficulty of Paying Living Expenses: Not hard at all  Food Insecurity: No Food Insecurity (04/03/2022)   Hunger Vital Sign    Worried About Running Out of Food in the Last Year: Never true    Ran  Out of Food in the Last Year: Never true  Transportation Needs: No Transportation Needs (04/03/2022)   PRAPARE - Hydrologist (Medical): No    Lack of Transportation (Non-Medical): No  Physical Activity: Insufficiently Active (04/03/2022)   Exercise Vital Sign    Days of Exercise per Week: 4 days    Minutes of Exercise per Session: 30 min  Stress: No Stress Concern Present (04/03/2022)   [place]    Feeling of Stress : Not at all  Social Connections: Socially Isolated (04/03/2022)   Social Connection and Isolation Panel [NHANES]    Frequency of Communication with Friends and Family: Three times a week    Frequency of Social Gatherings with Friends and Family: Three times a week    Attends Religious Services: Never    Active Member of Clubs or Organizations: No    Attends Archivist Meetings: Never    Marital Status: Widowed  Intimate Partner Violence: Not At Risk (04/03/2022)   Humiliation, Afraid, Rape, and Kick questionnaire    Fear of Current or Ex-Partner: No    Emotionally Abused: No    Physically Abused: No    Sexually Abused: No    Past Surgical History:  Procedure Laterality Date   ATRIAL FIBRILLATION ABLATION N/A 01/03/2022   Procedure: ATRIAL FIBRILLATION ABLATION;  Surgeon: Vickie Epley, MD;  Location: [facility];  Service: Cardiovascular;  Laterality: N/A;   CESAREAN SECTION     3   CHOLECYSTECTOMY     HIP ARTHROPLASTY Left    KNEE ARTHROPLASTY Bilateral    LEFT ATRIAL APPENDAGE OCCLUSION N/A 04/20/2022   Procedure: LEFT ATRIAL APPENDAGE OCCLUSION;  Surgeon: Vickie Epley, MD;  Location: [facility];  Service: Cardiovascular;  Laterality: N/A;   TEE WITHOUT CARDIOVERSION N/A 04/20/2022   Procedure: TRANSESOPHAGEAL ECHOCARDIOGRAM (TEE);  Surgeon: Vickie Epley, MD;  Location: [facility];  Service: Cardiovascular;  Laterality: N/A;     Family History  Problem Relation Age of Onset   Heart disease Mother     Allergies  Allergen Reactions   Cefaclor Hives and Rash    Current Outpatient Medications on File Prior to Visit  Medication Sig Dispense Refill   albuterol (VENTOLIN HFA) 108 (90 Base) MCG/ACT inhaler Inhale 1-2 puffs into the lungs every 6 (six) hours as needed for wheezing or shortness of breath. 6.7 g 0   allopurinol (ZYLOPRIM) 100 MG tablet Take 100 mg by mouth at bedtime.     amLODipine (NORVASC) 10 MG tablet Take 1 tablet (10 mg total) by mouth daily. 180 tablet 3   atorvastatin (LIPITOR) 20 MG tablet Take 1 tablet (20 mg total) by mouth daily. for cholesterol. Office visit required in late September for further refills. 90 tablet 0   Cholecalciferol (DIALYVITE VITAMIN D 5000) 125 MCG (5000 UT) capsule Take 5,000  Units by mouth daily.     clopidogrel (PLAVIX) 75 MG tablet Take 1 tablet (75 mg total) by mouth daily. START ON 06/05/22 AND STOP WARFARIN ON 06/04/22 90 tablet 3   cyanocobalamin 1000 MCG tablet Take 1,000 mcg by mouth daily. Vit B12     dofetilide (TIKOSYN) 125 MCG capsule Take 1 capsule (125 mcg total) by mouth 2 (two) times daily. 180 capsule 3   FARXIGA 10 MG TABS tablet Take 10 mg by mouth every morning.     ferrous sulfate 325 (65 FE) MG tablet Take 650 mg by mouth See admin instructions. Every three weeks for one week     losartan (COZAAR) 100 MG tablet Take 1 tablet (100 mg total) by mouth daily. for blood pressure. Office visit required in late September for further refills. 90 tablet 0   MAGnesium-Oxide 400 (240 Mg) MG tablet TAKE 1 TABLET(400 MG) BY MOUTH DAILY 90 tablet 3   triamcinolone cream (KENALOG) 0.1 % Apply 1 Application topically daily as needed. Up to 5 days per week. Avoid face, groin, underarms. 80 g 3   warfarin (COUMADIN) 5 MG tablet Take 1/2 tablet to 1 tablet daily or as directed by Anticoagulation Clinic. (Patient taking differently: Take 5 mg by mouth at bedtime. or as  directed by Anticoagulation Clinic.) 90 tablet 0   No current facility-administered medications on file prior to visit.    BP (!) 156/84   Pulse 65   Temp 98.6 F (37 C) (Temporal)   Ht 5\' 3"  (1.6 m)   Wt 181 lb (82.1 kg)   SpO2 99%   BMI 32.06 kg/m  Objective:   Physical Exam Cardiovascular:     Rate and Rhythm: Normal rate and regular rhythm.  Pulmonary:     Effort: Pulmonary effort is normal.     Breath sounds: Normal breath sounds.  Musculoskeletal:     Cervical back: Neck supple.  Skin:    General: Skin is warm and dry.  Neurological:     Mental Status: She is oriented to person, place, and time.  Psychiatric:        Mood and Affect: Mood normal.           Assessment & Plan:   Problem List Items Addressed This Visit       Cardiovascular and Mediastinum   Essential hypertension    Controlled.   Continue Losartan 100 mg daily and Amlodipine 10 mg daily. Reviewed BMP from September 2023  I evaluated patient, was consulted regarding treatment, and agree with assessment and plan per Tinnie Gens, RN, DNP student.   Allie Bossier, NP-C       Paroxysmal atrial fibrillation ([place])    Controlled.   Reviewed notes and labs from cardiology.   Continue Warfarin until 06/04/22. Start Plavix on 06/05/22 as recommended by cardiologist.  Patient understands these instructions.  Continue metoprolol succinate 50 mg daily.   I evaluated patient, was consulted regarding treatment, and agree with assessment and plan per Tinnie Gens, RN, DNP student.   Allie Bossier, NP-C          Respiratory   Asthma    Controlled.  Infrequent use of albuterol inhaler. Continue albuterol inhaler PRN      Relevant Medications   predniSONE (DELTASONE) 20 MG tablet     Digestive   Gastroesophageal reflux disease    Controlled. Asymptomatic for years.  Discussed a dose reduction Pantoprazole 20 mg daily, she agrees and will update if symptoms return.  I evaluated  patient, was consulted regarding treatment, and agree with assessment and plan per Tinnie Gens, RN, DNP student.   Allie Bossier, NP-C       Relevant Medications   pantoprazole (PROTONIX) 20 MG tablet     Musculoskeletal and Integument   Podagra    Repeat uric acid level pending. Continue allopurinol 100 mg daily per nephrology       Relevant Medications   predniSONE (DELTASONE) 20 MG tablet   Other Relevant Orders   Uric acid   Arthritis of both glenohumeral joints - Primary    Acute on chronic flare up of arthritis on bilateral shoulder.  Reviewed Dr. Lillie Fragmin notes from June 2023.  Given her history, she would benefit from the steroid injection. During interm, will start Prednisone 20 mg two tablets daily for four days and then one tablet daily for four days until she can get an appointment with Dr. Edilia Bo.    Will continue to monitor.   I evaluated patient, was consulted regarding treatment, and agree with assessment and plan per Tinnie Gens, RN, DNP student.   Allie Bossier, NP-C       Relevant Medications   predniSONE (DELTASONE) 20 MG tablet     Genitourinary   Chronic kidney disease, stage IV (severe) ([place])    Following with nephrology.  Reviewed BMP from September 2023.          Other   Mixed hyperlipidemia    Repeat lipid panel pending.   Continue Atorvastatin 20 mg daily.  I evaluated patient, was consulted regarding treatment, and agree with assessment and plan per Tinnie Gens, RN, DNP student.   Allie Bossier, NP-C       Relevant Orders   Lipid panel   Other Visit Diagnoses     Encounter for screening mammogram for malignant neoplasm of breast       Relevant Orders   MM 3D SCREEN BREAST BILATERAL   Estrogen deficiency       Relevant Orders   DG Bone Density   Need for immunization against influenza       Relevant Orders   Flu Vaccine QUAD High Dose(Fluad) (Completed)          Pleas Koch, NP

## 2022-06-04 NOTE — Progress Notes (Unsigned)
    Amy Sheeran T. Erykah Lippert, MD, [place] at [facility] [place], [address]  Phone: [phone]  FAX: [phone]  Amy Underwood - 76 y.o. female  MRN [id number]  Date of Birth: 05-Mar-1946  Date: 06/05/2022  PCP: Pleas Koch, NP  Referral: Pleas Koch, NP  No chief complaint on file.  Subjective:   Amy Underwood is a 77 y.o. very pleasant female patient with There is no height or weight on file to calculate BMI. who presents with the following:  She presents in f/u after I saw her in 02/2022, and at that point I felt like the primary issue was B glenohumeral joint OA with some notable restriction on motion.  While I have not reviewed her shoulder films, there was an orthopedics note that indicated moderate GH joint OA b.  She saw Dr. Carlis Abbott last week for general follow-up, and she gave her a burst of some oral prednisone, as well.     Review of Systems is noted in the HPI, as appropriate  Objective:   There were no vitals taken for this visit.  GEN: No acute distress; alert,appropriate. PULM: Breathing comfortably in no respiratory distress PSYCH: Normally interactive.   Laboratory and Imaging Data:  Assessment and Plan:   ***

## 2022-06-05 ENCOUNTER — Ambulatory Visit (INDEPENDENT_AMBULATORY_CARE_PROVIDER_SITE_OTHER)
Admission: RE | Admit: 2022-06-05 | Discharge: 2022-06-05 | Disposition: A | Discharge status: Home / Self Care | Payer: Medicare Other | Source: Ambulatory Visit | Attending: Family Medicine | Admitting: Family Medicine

## 2022-06-05 ENCOUNTER — Encounter: Payer: Self-pay | Admitting: Family Medicine

## 2022-06-05 ENCOUNTER — Ambulatory Visit (INDEPENDENT_AMBULATORY_CARE_PROVIDER_SITE_OTHER): Payer: Medicare Other | Admitting: Family Medicine

## 2022-06-05 VITALS — BP 130/74 | HR 71 | Temp 98.0°F | Ht 63.0 in | Wt 181.5 lb

## 2022-06-05 DIAGNOSIS — M19012 Primary osteoarthritis, left shoulder: Secondary | ICD-10-CM | POA: Diagnosis not present

## 2022-06-05 DIAGNOSIS — M7502 Adhesive capsulitis of left shoulder: Secondary | ICD-10-CM | POA: Diagnosis not present

## 2022-06-05 DIAGNOSIS — M7501 Adhesive capsulitis of right shoulder: Secondary | ICD-10-CM | POA: Diagnosis not present

## 2022-06-05 DIAGNOSIS — M19011 Primary osteoarthritis, right shoulder: Secondary | ICD-10-CM

## 2022-06-05 MED ORDER — TRIAMCINOLONE ACETONIDE 40 MG/ML IJ SUSP
40.0000 mg | Freq: Once | INTRAMUSCULAR | Status: AC
  Administered 2022-06-05: 40 mg via INTRA_ARTICULAR

## 2022-06-07 ENCOUNTER — Other Ambulatory Visit: Payer: Self-pay

## 2022-06-21 ENCOUNTER — Telehealth (HOSPITAL_COMMUNITY): Payer: Self-pay | Admitting: Emergency Medicine

## 2022-06-21 NOTE — Telephone Encounter (Signed)
Reaching out to patient to offer assistance regarding upcoming cardiac imaging study; pt verbalizes understanding of appt date/time, parking situation and where to check in, pre-test NPO status and medications ordered, and verified current allergies; name and call back number provided for further questions should they arise Destynie Toomey RN Navigator Cardiac Imaging [facility] [phone] office [phone] cell 

## 2022-06-22 ENCOUNTER — Ambulatory Visit (HOSPITAL_COMMUNITY)
Admission: RE | Admit: 2022-06-22 | Discharge: 2022-06-22 | Disposition: A | Discharge status: Home / Self Care | Payer: Medicare Other | Source: Ambulatory Visit | Attending: Cardiology | Admitting: Cardiology

## 2022-06-22 DIAGNOSIS — Z95818 Presence of other cardiac implants and grafts: Secondary | ICD-10-CM | POA: Insufficient documentation

## 2022-06-22 DIAGNOSIS — I4819 Other persistent atrial fibrillation: Secondary | ICD-10-CM | POA: Insufficient documentation

## 2022-06-22 MED ORDER — IOHEXOL 350 MG/ML SOLN
95.0000 mL | Freq: Once | INTRAVENOUS | Status: AC | PRN
  Administered 2022-06-22: 95 mL via INTRAVENOUS

## 2022-06-27 ENCOUNTER — Other Ambulatory Visit: Payer: Self-pay | Admitting: Primary Care

## 2022-06-27 DIAGNOSIS — I10 Essential (primary) hypertension: Secondary | ICD-10-CM

## 2022-06-27 DIAGNOSIS — E782 Mixed hyperlipidemia: Secondary | ICD-10-CM

## 2022-06-27 DIAGNOSIS — K219 Gastro-esophageal reflux disease without esophagitis: Secondary | ICD-10-CM

## 2022-06-30 ENCOUNTER — Other Ambulatory Visit: Payer: Self-pay | Admitting: Primary Care

## 2022-06-30 DIAGNOSIS — K219 Gastro-esophageal reflux disease without esophagitis: Secondary | ICD-10-CM

## 2022-06-30 DIAGNOSIS — E782 Mixed hyperlipidemia: Secondary | ICD-10-CM

## 2022-06-30 DIAGNOSIS — I10 Essential (primary) hypertension: Secondary | ICD-10-CM

## 2022-07-03 ENCOUNTER — Encounter (INDEPENDENT_AMBULATORY_CARE_PROVIDER_SITE_OTHER): Payer: Self-pay

## 2022-07-04 ENCOUNTER — Other Ambulatory Visit: Payer: Self-pay | Admitting: Cardiology

## 2022-07-04 NOTE — Telephone Encounter (Signed)
Per last OV note by [name], NP warfarin stopped 06/04/2022

## 2022-07-05 ENCOUNTER — Other Ambulatory Visit: Payer: Self-pay | Admitting: Primary Care

## 2022-07-05 DIAGNOSIS — K219 Gastro-esophageal reflux disease without esophagitis: Secondary | ICD-10-CM

## 2022-07-20 ENCOUNTER — Other Ambulatory Visit: Payer: Self-pay | Admitting: Primary Care

## 2022-07-20 DIAGNOSIS — K219 Gastro-esophageal reflux disease without esophagitis: Secondary | ICD-10-CM

## 2022-07-20 NOTE — Telephone Encounter (Signed)
Called patient, she could not talk. She requested I call back after 5:30 today.

## 2022-07-20 NOTE — Telephone Encounter (Signed)
How's she doing since we reduced her heartburn medication, pantoprazole, to 20 mg? Any heartburn symptoms?  If no problem, would she be willing to try reducing down further with a different medication called famotidine?

## 2022-07-21 MED ORDER — FAMOTIDINE 20 MG PO TABS: 20.0000 mg | ORAL_TABLET | Freq: Every day | ORAL | 0 refills | Status: DC

## 2022-07-21 NOTE — Telephone Encounter (Signed)
Called and spoke with patient, she states she isnt having any heartburn symptoms and she never noticed a difference from when she made the switch to a lower dose. She said she is willing to reduce down and try famotidine.

## 2022-07-21 NOTE — Telephone Encounter (Signed)
Noted.  Rx for famotidine 20 mg sent to pharmacy. Discontinue pantoprazole.

## 2022-07-24 ENCOUNTER — Ambulatory Visit (INDEPENDENT_AMBULATORY_CARE_PROVIDER_SITE_OTHER): Payer: Medicare Other | Admitting: Family Medicine

## 2022-07-24 ENCOUNTER — Encounter: Payer: Self-pay | Admitting: Family Medicine

## 2022-07-24 VITALS — BP 120/60 | HR 68 | Temp 99.2°F | Ht 63.0 in | Wt 177.0 lb

## 2022-07-24 DIAGNOSIS — R200 Anesthesia of skin: Secondary | ICD-10-CM

## 2022-07-24 DIAGNOSIS — M10071 Idiopathic gout, right ankle and foot: Secondary | ICD-10-CM

## 2022-07-24 DIAGNOSIS — R202 Paresthesia of skin: Secondary | ICD-10-CM

## 2022-07-24 MED ORDER — PREDNISONE 20 MG PO TABS: ORAL_TABLET | ORAL | 0 refills | Status: DC

## 2022-07-24 NOTE — Progress Notes (Signed)
Amy Underwood T. [name], MD, [name] at [facility] [place], [address]  Phone: [phone]  FAX: [phone]  Amy Underwood - 76 y.o. female  MRN [id number]  Date of Birth: [birth year]  Date: 07/24/2022  PCP: Amy Koch, NP  Referral: Amy Koch, NP  Chief Complaint  Patient presents with   Numbness    Bilateral Arms from elbow down to hands   Ankle Pain    With swelling-No injury   Subjective:   Amy Underwood is a 76 y.o. very pleasant female patient with Body mass index is 31.35 kg/m. who presents with the following:  I actually saw the patient on June 05, 2022, at that point she appeared to have some frozen shoulder.  At that point, she felt as if her right shoulder was significantly worse than the left, and we did do bilateral intra-articular injections at that time.  She also is having some pain in the arms as well as some tingling.  She was not having any neck pain at all.  On plain film, she does have some acromioclavicular arthritis, but minimal glenohumeral arthritis.  She also presents with some ongoing ankle pain with swelling and warmth.  Hands will stay cold and hurt all the time.  Wakes up at night, and she cannot get up in a position.  Has been ongoing for a couple of months.   -Primary concern at this point is the numbness in her hands, and is been ongoing for several months.  The onset was fairly abrupt in nature, it is bilateral.  She does have some diffuse decreased sensation in her hands.  She also has tingling relatively diffusely.  Prior to a few months ago, this did not bother her at all, and is essentially nonexistent.  R ankle swollen.  Gout flare.   Review of Systems is noted in the HPI, as appropriate  Objective:   BP 120/60   Pulse 68   Temp 99.2 F (37.3 C) (Oral)   Ht 5\' 3"  (1.6 m)   Wt 177 lb (80.3 kg)   SpO2 98%   BMI 31.35 kg/m   GEN: No acute distress;  alert,appropriate. PULM: Breathing comfortably in no respiratory distress PSYCH: Normally interactive.   She does have some swelling as well as some mild warmth in the anterior ankle on the right.  There is no bruising.  She has no focal bony tenderness outside of the ankle itself.  Moderate loss of motion in the cervical spine.  There is no pain with terminal motion.  She is not able to induce any radicular symptoms with neck motion. Self and examiner driven Spurling's are both negative.  To gross touch the patient has diminished sensation throughout the entirety of the forearm and the entirety of the hand bilaterally.  Strength is 5/5 in all directions of the upper extremities.  Laboratory and Imaging Data:  Assessment and Plan:     ICD-10-CM   1. Bilateral hand numbness  R20.0 Ambulatory referral to Neurology    2. Acute idiopathic gout of right ankle  M10.071     3. Numbness and tingling in left arm  R20.0 Ambulatory referral to Neurology   R20.2     4. Numbness and tingling of right arm  R20.0 Ambulatory referral to Neurology   R20.2      Acute gout flare of the right ankle.  I will place the patient on some oral prednisone for  gout flare.  Idiopathic hand numbness and tingling.  This fairly isolated, but is completely diffuse throughout the entirety of the forearm and hand.  This does not follow a cervical level.  I am unclear as to the etiology of this, I think it is in need of further work-up.  I think that she would be best served by discussing this further with neurology.  Medication Management during today's office visit: Meds ordered this encounter  Medications   predniSONE (DELTASONE) 20 MG tablet    Sig: 2 tabs po daily for 5 days, then 1 tab po daily for 5 days    Dispense:  15 tablet    Refill:  0   Medications Discontinued During This Encounter  Medication Reason   predniSONE (DELTASONE) 20 MG tablet     Orders placed today for conditions managed  today: Orders Placed This Encounter  Procedures   Ambulatory referral to Neurology    Disposition: No follow-ups on file.  [employer] One speech-to-text software was used for transcription in this dictation.  Possible transcriptional errors can occur using Editor, commissioning.   Signed,  Amy Deed. Vilda Zollner, MD   Outpatient Encounter Medications as of 07/24/2022  Medication Sig   albuterol (VENTOLIN HFA) 108 (90 Base) MCG/ACT inhaler Inhale 1-2 puffs into the lungs every 6 (six) hours as needed for wheezing or shortness of breath.   allopurinol (ZYLOPRIM) 100 MG tablet Take 100 mg by mouth at bedtime.   amLODipine (NORVASC) 10 MG tablet Take 1 tablet (10 mg total) by mouth daily.   atorvastatin (LIPITOR) 20 MG tablet TAKE 1 TABLET BY MOUTH ONCE  DAILY FOR CHOLESTEROL   Cholecalciferol (DIALYVITE VITAMIN D 5000) 125 MCG (5000 UT) capsule Take 5,000 Units by mouth daily.   clopidogrel (PLAVIX) 75 MG tablet Take 1 tablet (75 mg total) by mouth daily. START ON 06/05/22 AND STOP WARFARIN ON 06/04/22   cyanocobalamin 1000 MCG tablet Take 1,000 mcg by mouth daily. Vit B12   dofetilide (TIKOSYN) 125 MCG capsule Take 1 capsule (125 mcg total) by mouth 2 (two) times daily.   famotidine (PEPCID) 20 MG tablet Take 1 tablet (20 mg total) by mouth daily. For heartburn   FARXIGA 10 MG TABS tablet Take 10 mg by mouth every morning.   ferrous sulfate 325 (65 FE) MG tablet Take 650 mg by mouth See admin instructions. Every three weeks for one week   losartan (COZAAR) 100 MG tablet TAKE 1 TABLET BY MOUTH ONCE  DAILY FOR BLOOD PRESSURE   MAGnesium-Oxide 400 (240 Mg) MG tablet TAKE 1 TABLET(400 MG) BY MOUTH DAILY   predniSONE (DELTASONE) 20 MG tablet 2 tabs po daily for 5 days, then 1 tab po daily for 5 days   triamcinolone cream (KENALOG) 0.1 % Apply 1 Application topically daily as needed. Up to 5 days per week. Avoid face, groin, underarms.   [DISCONTINUED] predniSONE (DELTASONE) 20 MG tablet Take two  tablets by mouth daily in the morning for four days and then take one tablet for four days.   No facility-administered encounter medications on file as of 07/24/2022.

## 2022-08-08 ENCOUNTER — Encounter: Payer: Self-pay | Admitting: Primary Care

## 2022-08-08 ENCOUNTER — Ambulatory Visit (INDEPENDENT_AMBULATORY_CARE_PROVIDER_SITE_OTHER): Payer: Medicare Other | Admitting: Primary Care

## 2022-08-08 VITALS — BP 124/68 | HR 70 | Temp 99.0°F | Ht 63.0 in | Wt 179.0 lb

## 2022-08-08 DIAGNOSIS — R200 Anesthesia of skin: Secondary | ICD-10-CM | POA: Insufficient documentation

## 2022-08-08 HISTORY — DX: Anesthesia of skin: R20.0

## 2022-08-08 MED ORDER — GABAPENTIN 100 MG PO CAPS: ORAL_CAPSULE | ORAL | 0 refills | Status: DC

## 2022-08-08 NOTE — Progress Notes (Signed)
Subjective:    Patient ID: Amy Underwood, female    DOB: [birth year], 76 y.o.   MRN: [id number]  HPI  Amy Underwood is a very pleasant 76 y.o. female with a history of atrial fibrillation, hypertension, asthma, CKD, osteoarthritis, anemia, BPH who presents today for further evaluation of paresthesias and pain to the hands   Evaluated by Dr. Lorelei Pont on 07/24/22 for several concerns, paresthesias and pain to bilateral hands being one. During this visit she mentioned waking up durin gthe night with a cold sensation and pain to her hands with numbness. The etiology of her symptoms was unclear so she was referred to neurology for further evaluation.   Today she mentions her pain and tingling to her hands is constant which began a few months ago. During the night her hands will go completely numb. Her symptoms are isolated to the hands and wrists, right worse than left. She has never been diagnosed with carpal tunnel syndrome. She denies repetitive movements now or during her working years. She denies injury/trauma.   She has an appointment scheduled with neurology in February 2024.    Review of Systems  Musculoskeletal:  Positive for arthralgias. Negative for joint swelling.  Skin:  Negative for color change.  Neurological:  Positive for numbness. Negative for weakness.         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 Quentin Ore   Psoriasis     Social History   Socioeconomic History   Marital status: Widowed    Spouse name: Not on file   Number of children: Not on file   Years of education: Not on file   Highest education level: Not on file  Occupational History   Not on file  Tobacco Use    Smoking status: Former    Types: Cigarettes    Start date: 2001   Smokeless tobacco: Never  Vaping Use   Vaping Use: Never used  Substance and Sexual Activity   Alcohol use: Not on file   Drug use: Never   Sexual activity: Not Currently  Other Topics Concern   Not on file  Social History Narrative   Not on file   Social Determinants of Health   Financial Resource Strain: Low Risk  (04/03/2022)   Overall Financial Resource Strain (CARDIA)    Difficulty of Paying Living Expenses: Not hard at all  Food Insecurity: No Food Insecurity (04/03/2022)   Hunger Vital Sign    Worried About Running Out of Food in the Last Year: Never true    Ran Out of Food in the Last Year: Never true  Transportation Needs: No Transportation Needs (04/03/2022)   PRAPARE - Hydrologist (Medical): No    Lack of Transportation (Non-Medical): No  Physical Activity: Insufficiently Active (04/03/2022)   Exercise Vital Sign    Days of Exercise per Week: 4 days    Minutes of Exercise per Session: 30 min  Stress: No Stress Concern Present (04/03/2022)   [place]    Feeling of Stress : Not at all  Social Connections: Socially Isolated (04/03/2022)   Social  Connection and Isolation Panel [NHANES]    Frequency of Communication with Friends and Family: Three times a week    Frequency of Social Gatherings with Friends and Family: Three times a week    Attends Religious Services: Never    Active Member of Clubs or Organizations: No    Attends Archivist Meetings: Never    Marital Status: Widowed  Intimate Partner Violence: Not At Risk (04/03/2022)   Humiliation, Afraid, Rape, and Kick questionnaire    Fear of Current or Ex-Partner: No    Emotionally Abused: No    Physically Abused: No    Sexually Abused: No    Past Surgical History:  Procedure Laterality Date   ATRIAL FIBRILLATION ABLATION N/A 01/03/2022    Procedure: ATRIAL FIBRILLATION ABLATION;  Surgeon: Vickie Epley, MD;  Location: [facility];  Service: Cardiovascular;  Laterality: N/A;   CESAREAN SECTION     3   CHOLECYSTECTOMY     HIP ARTHROPLASTY Left    KNEE ARTHROPLASTY Bilateral    LEFT ATRIAL APPENDAGE OCCLUSION N/A 04/20/2022   Procedure: LEFT ATRIAL APPENDAGE OCCLUSION;  Surgeon: Vickie Epley, MD;  Location: [facility];  Service: Cardiovascular;  Laterality: N/A;   TEE WITHOUT CARDIOVERSION N/A 04/20/2022   Procedure: TRANSESOPHAGEAL ECHOCARDIOGRAM (TEE);  Surgeon: Vickie Epley, MD;  Location: [facility];  Service: Cardiovascular;  Laterality: N/A;    Family History  Problem Relation Age of Onset   Heart disease Mother     Allergies  Allergen Reactions   Cefaclor Hives and Rash    Current Outpatient Medications on File Prior to Visit  Medication Sig Dispense Refill   albuterol (VENTOLIN HFA) 108 (90 Base) MCG/ACT inhaler Inhale 1-2 puffs into the lungs every 6 (six) hours as needed for wheezing or shortness of breath. 6.7 g 0   allopurinol (ZYLOPRIM) 100 MG tablet Take 100 mg by mouth at bedtime.     amLODipine (NORVASC) 10 MG tablet Take 1 tablet (10 mg total) by mouth daily. 180 tablet 3   atorvastatin (LIPITOR) 20 MG tablet TAKE 1 TABLET BY MOUTH ONCE  DAILY FOR CHOLESTEROL 90 tablet 2   Cholecalciferol (DIALYVITE VITAMIN D 5000) 125 MCG (5000 UT) capsule Take 5,000 Units by mouth daily.     clopidogrel (PLAVIX) 75 MG tablet Take 1 tablet (75 mg total) by mouth daily. START ON 06/05/22 AND STOP WARFARIN ON 06/04/22 90 tablet 3   cyanocobalamin 1000 MCG tablet Take 1,000 mcg by mouth daily. Vit B12     dofetilide (TIKOSYN) 125 MCG capsule Take 1 capsule (125 mcg total) by mouth 2 (two) times daily. 180 capsule 3   famotidine (PEPCID) 20 MG tablet Take 1 tablet (20 mg total) by mouth daily. For heartburn 90 tablet 0   FARXIGA 10 MG TABS tablet Take 10 mg by mouth every morning.      ferrous sulfate 325 (65 FE) MG tablet Take 650 mg by mouth See admin instructions. Every three weeks for one week     losartan (COZAAR) 100 MG tablet TAKE 1 TABLET BY MOUTH ONCE  DAILY FOR BLOOD PRESSURE 90 tablet 2   MAGnesium-Oxide 400 (240 Mg) MG tablet TAKE 1 TABLET(400 MG) BY MOUTH DAILY 90 tablet 3   triamcinolone cream (KENALOG) 0.1 % Apply 1 Application topically daily as needed. Up to 5 days per week. Avoid face, groin, underarms. 80 g 3   No current facility-administered medications on file prior to visit.  BP 124/68   Pulse 70   Temp 99 F (37.2 C) (Temporal)   Ht 5\' 3"  (1.6 m)   Wt 179 lb (81.2 kg)   SpO2 100%   BMI 31.71 kg/m  Objective:   Physical Exam Cardiovascular:     Rate and Rhythm: Normal rate and regular rhythm.  Pulmonary:     Effort: Pulmonary effort is normal.  Musculoskeletal:     Cervical back: Neck supple.  Skin:    General: Skin is warm and dry.  Neurological:     Comments: Negative Phalen's and Tinel's sign.   Psychiatric:        Mood and Affect: Mood normal.           Assessment & Plan:   Problem List Items Addressed This Visit       Other   Bilateral hand numbness - Primary    Symptoms suggestive of carpal tunnel.  Reviewed office notes from Sports medicine who doesn't suspect cervical cause.  Also less likely for B12 deficiency as she is on supplementation. Reviewed recent CBC which is normal.  Discussed use of hand braces at night. Start gabapentin 100-300 mg 1-2 times daily. She will update.   Follow up with neurology as scheduled.      Relevant Medications   gabapentin (NEURONTIN) 100 MG capsule       Pleas Koch, NP

## 2022-08-08 NOTE — Patient Instructions (Signed)
You may take gabapentin 100 mg capsules for your hand pain and numbness. Take 1 to 3 capsules by mouth once or twice daily.  Let me know what dose works best.  Consider the hand braces as discussed.  It was a pleasure to see you today!

## 2022-08-08 NOTE — Assessment & Plan Note (Signed)
Symptoms suggestive of carpal tunnel.  Reviewed office notes from Sports medicine who doesn't suspect cervical cause.  Also less likely for B12 deficiency as she is on supplementation. Reviewed recent CBC which is normal.  Discussed use of hand braces at night. Start gabapentin 100-300 mg 1-2 times daily. She will update.   Follow up with neurology as scheduled.

## 2022-08-23 ENCOUNTER — Telehealth: Payer: Self-pay | Admitting: Primary Care

## 2022-08-23 ENCOUNTER — Other Ambulatory Visit: Payer: Self-pay | Admitting: Primary Care

## 2022-08-23 DIAGNOSIS — R200 Anesthesia of skin: Secondary | ICD-10-CM

## 2022-08-23 MED ORDER — GABAPENTIN 300 MG PO CAPS: 300.0000 mg | ORAL_CAPSULE | Freq: Three times a day (TID) | ORAL | 0 refills | Status: DC

## 2022-08-23 NOTE — Telephone Encounter (Signed)
She takes 2 capsules at a time typically, if the pain is severe she takes 3 capsules.  She typically takes the 2 capsules three times daily. If she gets up in the middle of the night with pain that is when she takes 3 capsules.   There is improvement for 30 min after she 2 capsules, but it does not provide reilef any longer than that.  When she takes 3 capsules at night on occasion she gets up to 2 hours of relief.

## 2022-08-23 NOTE — Telephone Encounter (Signed)
Patient was seen an 12/5 ,and prescribed medication  gabapentin (NEURONTIN) 100 MG capsule  for her pain. She called in today saying that it  isn't helping her,she's still in pain. She would like advice as to what to do now,or if another medication can be called in for her?

## 2022-08-23 NOTE — Telephone Encounter (Signed)
How many capsules of gabapentin is she taking at a time? 1, 2, 3? How many times daily?  Has there been any improvement? Even if the slightest?

## 2022-08-23 NOTE — Telephone Encounter (Signed)
Called and spoke with patient, she is willing to try the 300mg  three times daily. She would like the new prescription called in so she only has to take 1 pill.

## 2022-08-23 NOTE — Telephone Encounter (Signed)
Noted.  New prescription sent to pharmacy.

## 2022-08-23 NOTE — Telephone Encounter (Signed)
I recommend we have her take 300 mg 3 times daily for pain.  I can change the prescription so that she only has to take 1 pill (300 mg) 3 times daily.  If she is willing to try this, I think it could help.  Consistency is key with this medication.

## 2022-08-30 ENCOUNTER — Ambulatory Visit (INDEPENDENT_AMBULATORY_CARE_PROVIDER_SITE_OTHER): Payer: Medicare Other | Admitting: Primary Care

## 2022-08-30 ENCOUNTER — Encounter: Payer: Self-pay | Admitting: Primary Care

## 2022-08-30 ENCOUNTER — Telehealth: Payer: Self-pay | Admitting: Primary Care

## 2022-08-30 VITALS — BP 146/82 | HR 92 | Temp 98.4°F | Ht 63.0 in | Wt 186.0 lb

## 2022-08-30 DIAGNOSIS — M109 Gout, unspecified: Secondary | ICD-10-CM | POA: Diagnosis not present

## 2022-08-30 DIAGNOSIS — R200 Anesthesia of skin: Secondary | ICD-10-CM

## 2022-08-30 MED ORDER — PREDNISONE 20 MG PO TABS: ORAL_TABLET | ORAL | 0 refills | Status: DC

## 2022-08-30 MED ORDER — METHYLPREDNISOLONE ACETATE 80 MG/ML IJ SUSP
80.0000 mg | Freq: Once | INTRAMUSCULAR | Status: AC
  Administered 2022-08-30: 80 mg via INTRAMUSCULAR

## 2022-08-30 NOTE — Telephone Encounter (Signed)
Patient called in and stated that the gabapentin (NEURONTIN) 300 MG capsule medication isn't working. She stated that she has no feeling in her hands and one foot is swollen is now. She was wanting to know if Belenda Cruise could squeeze her in today or tomorrow so she can show her what's going on. Please advise. Thank you!

## 2022-08-30 NOTE — Assessment & Plan Note (Signed)
Flare suspected today on exam.  Start prednisone. Take three tablets my mouth once daily in the morning for 3 days, then two tablets for 3 days, then 1 tablet for 3 days.

## 2022-08-30 NOTE — Patient Instructions (Signed)
Start prednisone tablets for gout and arm pain/numbness.  Take three tablets my mouth once daily in the morning for 3 days, then two tablets for 3 days, then 1 tablet for 3 days.   Start taking Tylenol at bedtime as discussed.  It was a pleasure to see you today!

## 2022-08-30 NOTE — Assessment & Plan Note (Signed)
Continue to suspect carpal tunnel/central nerve involvement.  Stop gabapentin as this was ineffective. Start prednisone. Take three tablets my mouth once daily in the morning for 3 days, then two tablets for 3 days, then 1 tablet for 3 days.   Start Tylenol 1000 mg HS.  Follow up with neurology.

## 2022-08-30 NOTE — Progress Notes (Signed)
Subjective:    Patient ID: Amy Underwood, female    DOB: 1946-02-15, 76 y.o.   MRN: [id number]  HPI  Shakeyla Giebler is a very pleasant 76 y.o. female with a history of paroxysmal atrial fibrillation, hypertension, osteoarthritis of hip, trigger finger of left hand, osteoarthritis of glenohumeral joints, who presents today to discuss paresthesias.  She was last evaluated by me on 08/08/22 for continued bilateral paresthesias and pain to bilateral hands. Evaluated by Dr. Lorelei Pont in November 2023 for same. During her most recent visit her symptoms were suggestive of carpal tunnel. She does have follow up with neurology scheduled for February 2024. She was prescribed gabapentin 100-300 mg to try. Recent CBC was unremarkable. She was already taking vitamin B12 supplementation.   Since her last visit she continues to notice bilateral upper extremity numbness and pain from her anterior elbows down to her fingers. She does continue to experience bilateral shoulder pain. She denies neck pain. She did not notice improvement in symptoms with gabapentin 300 mg TID. She is taking 2 Extra Strength Tylenol every 4 hours when her pain is worse.   She also believes she is experiencing a gout flare to her left ankle. Several evenings ago she noticed increased swelling and warmth to her left ankle with intense pain. This feels like her prior gout flare. She is compliant to her allopurinol 100 mg daily for prevention. Her last gout attack was 6 months ago.   Review of Systems  Musculoskeletal:  Positive for arthralgias and joint swelling.  Neurological:  Positive for numbness.         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 Quentin Ore   Psoriasis     Social History   Socioeconomic History   Marital status: Widowed    Spouse name: Not on file   Number of children: Not on file   Years of education: Not on file   Highest education level: Not on file  Occupational History   Not on file  Tobacco Use   Smoking status: Former    Types: Cigarettes    Start date: 2001   Smokeless tobacco: Never  Vaping Use   Vaping Use: Never used  Substance and Sexual Activity   Alcohol use: Not on file   Drug use: Never   Sexual activity: Not Currently  Other Topics Concern   Not on file  Social History Narrative   Not on file   Social Determinants of Health   Financial Resource Strain: Low Risk  (04/03/2022)   Overall Financial Resource Strain (CARDIA)    Difficulty of Paying Living Expenses: Not hard at all  Food Insecurity: No Food Insecurity (04/03/2022)   Hunger Vital Sign    Worried About Running Out of Food in the Last Year: Never true    Ran Out of Food in the Last Year: Never true  Transportation Needs: No Transportation Needs (04/03/2022)   PRAPARE - Hydrologist (Medical): No    Lack of Transportation (Non-Medical): No  Physical Activity: Insufficiently Active (04/03/2022)   Exercise Vital Sign    Days of Exercise per Week: 4 days  Minutes of Exercise per Session: 30 min  Stress: No Stress Concern Present (04/03/2022)   [place]    Feeling of Stress : Not at all  Social Connections: Socially Isolated (04/03/2022)   Social Connection and Isolation Panel [NHANES]    Frequency of Communication with Friends and Family: Three times a week    Frequency of Social Gatherings with Friends and Family: Three times a week    Attends Religious Services: Never    Active Member of Clubs or Organizations: No    Attends Archivist Meetings: Never    Marital Status: Widowed  Intimate Partner Violence: Not At  Risk (04/03/2022)   Humiliation, Afraid, Rape, and Kick questionnaire    Fear of Current or Ex-Partner: No    Emotionally Abused: No    Physically Abused: No    Sexually Abused: No    Past Surgical History:  Procedure Laterality Date   ATRIAL FIBRILLATION ABLATION N/A 01/03/2022   Procedure: ATRIAL FIBRILLATION ABLATION;  Surgeon: Vickie Epley, MD;  Location: [facility];  Service: Cardiovascular;  Laterality: N/A;   CESAREAN SECTION     3   CHOLECYSTECTOMY     HIP ARTHROPLASTY Left    KNEE ARTHROPLASTY Bilateral    LEFT ATRIAL APPENDAGE OCCLUSION N/A 04/20/2022   Procedure: LEFT ATRIAL APPENDAGE OCCLUSION;  Surgeon: Vickie Epley, MD;  Location: [facility];  Service: Cardiovascular;  Laterality: N/A;   TEE WITHOUT CARDIOVERSION N/A 04/20/2022   Procedure: TRANSESOPHAGEAL ECHOCARDIOGRAM (TEE);  Surgeon: Vickie Epley, MD;  Location: [facility];  Service: Cardiovascular;  Laterality: N/A;    Family History  Problem Relation Age of Onset   Heart disease Mother     Allergies  Allergen Reactions   Cefaclor Hives and Rash    Current Outpatient Medications on File Prior to Visit  Medication Sig Dispense Refill   albuterol (VENTOLIN HFA) 108 (90 Base) MCG/ACT inhaler Inhale 1-2 puffs into the lungs every 6 (six) hours as needed for wheezing or shortness of breath. 6.7 g 0   allopurinol (ZYLOPRIM) 100 MG tablet Take 100 mg by mouth at bedtime.     amLODipine (NORVASC) 10 MG tablet Take 1 tablet (10 mg total) by mouth daily. 180 tablet 3   atorvastatin (LIPITOR) 20 MG tablet TAKE 1 TABLET BY MOUTH ONCE  DAILY FOR CHOLESTEROL 90 tablet 2   Cholecalciferol (DIALYVITE VITAMIN D 5000) 125 MCG (5000 UT) capsule Take 5,000 Units by mouth daily.     clopidogrel (PLAVIX) 75 MG tablet Take 1 tablet (75 mg total) by mouth daily. START ON 06/05/22 AND STOP WARFARIN ON 06/04/22 90 tablet 3   cyanocobalamin 1000 MCG tablet Take 1,000 mcg by mouth daily. Vit B12      dofetilide (TIKOSYN) 125 MCG capsule Take 1 capsule (125 mcg total) by mouth 2 (two) times daily. 180 capsule 3   famotidine (PEPCID) 20 MG tablet Take 1 tablet (20 mg total) by mouth daily. For heartburn 90 tablet 0   FARXIGA 10 MG TABS tablet Take 10 mg by mouth every morning.     ferrous sulfate 325 (65 FE) MG tablet Take 650 mg by mouth See admin instructions. Every three weeks for one week     losartan (COZAAR) 100 MG tablet TAKE 1 TABLET BY MOUTH ONCE  DAILY FOR BLOOD PRESSURE 90 tablet 2   MAGnesium-Oxide 400 (240 Mg) MG tablet TAKE 1 TABLET(400 MG) BY MOUTH  DAILY 90 tablet 3   triamcinolone cream (KENALOG) 0.1 % Apply 1 Application topically daily as needed. Up to 5 days per week. Avoid face, groin, underarms. 80 g 3   No current facility-administered medications on file prior to visit.    BP (!) 146/82 (BP Location: Left Arm, Patient Position: Sitting, Cuff Size: Normal)   Pulse 92   Temp 98.4 F (36.9 C) (Temporal)   Ht 5\' 3"  (1.6 m)   Wt 186 lb (84.4 kg)   SpO2 98%   BMI 32.95 kg/m  Objective:   Physical Exam Musculoskeletal:     Left ankle: Swelling present. Tenderness present. Decreased range of motion.  Skin:    General: Skin is warm.     Findings: Erythema present.  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  Start prednisone. Take three tablets my mouth once daily in the morning for 3 days, then two tablets for 3 days, then 1 tablet for 3 days.       Relevant Medications   predniSONE (DELTASONE) 20 MG tablet     Other   Bilateral hand numbness - Primary    Continue to suspect carpal tunnel/central nerve involvement.  Stop gabapentin as this was ineffective. Start prednisone. Take three tablets my mouth once daily in the morning for 3 days, then two tablets for 3 days, then 1  tablet for 3 days.   Start Tylenol 1000 mg HS.  Follow up with neurology.      Relevant Medications   predniSONE (DELTASONE) 20 MG tablet       Pleas Koch, NP

## 2022-08-30 NOTE — Telephone Encounter (Signed)
Patient is scheduled for today at 12:20 PM.

## 2022-09-07 ENCOUNTER — Other Ambulatory Visit: Payer: Medicare Other

## 2022-09-21 ENCOUNTER — Ambulatory Visit (INDEPENDENT_AMBULATORY_CARE_PROVIDER_SITE_OTHER): Payer: Medicare Other | Admitting: Neurology

## 2022-09-21 ENCOUNTER — Encounter: Payer: Self-pay | Admitting: Neurology

## 2022-09-21 ENCOUNTER — Other Ambulatory Visit: Payer: Self-pay | Admitting: Primary Care

## 2022-09-21 VITALS — BP 133/80 | HR 78 | Ht 63.0 in | Wt 174.0 lb

## 2022-09-21 DIAGNOSIS — K219 Gastro-esophageal reflux disease without esophagitis: Secondary | ICD-10-CM

## 2022-09-21 DIAGNOSIS — R202 Paresthesia of skin: Secondary | ICD-10-CM

## 2022-09-21 MED ORDER — GABAPENTIN 100 MG PO CAPS: 200.0000 mg | ORAL_CAPSULE | Freq: Three times a day (TID) | ORAL | 3 refills | Status: DC

## 2022-09-21 MED ORDER — DULOXETINE HCL 30 MG PO CPEP: 30.0000 mg | ORAL_CAPSULE | Freq: Every day | ORAL | 3 refills | Status: DC

## 2022-09-21 NOTE — Progress Notes (Signed)
Chief Complaint  Patient presents with   New Patient (Initial Visit)    Rm 12. Accompanied by sister. NP Internal referral for numbness, tingling bilateral hands.      ASSESSMENT AND PLAN  Amy Underwood is a 77 y.o. female    Bilateral hands paresthesia  Differentiation positive including carpal tunnel syndrome  EMG nerve conduction study  Cymbalta 30 mg every morning, gabapentin 100 mg maximum 6 tablets in 1 day  Bilateral wrist splint   DIAGNOSTIC DATA (LABS, IMAGING, TESTING) - I reviewed patient records, labs, notes, testing and imaging myself where available.   MEDICAL HISTORY:  Amy Underwood, is a 77 year old right-handed female, seen in request by primary care physician Dr. Owens Loffler, for evaluation of bilateral hands paresthesia, she is accompanied by her sister at today's visit on September 21, 2022   I reviewed and summarized the referring note. PMHX. HTN HLD Adhesive capsulitis of right shoulder. Watchman for Afib.  She lives alone, noticed gradual onset bilateral hands paresthesia around July 2023, getting worse, especially on her right dominant hand, failed fingertips numbness all the time, especially at nighttime, she woke up has to shake her hands to make the sensation comes back, sometimes to the point of fingertips painful,  She denies neck pain, no gait abnormality, no bowel bladder incontinence, no significant muscle weakness  PHYSICAL EXAM:   Vitals:   09/21/22 1100  BP: 133/80  Pulse: 78  Weight: 174 lb (78.9 kg)  Height: 5\' 3"  (1.6 m)   Not recorded     Body mass index is 30.82 kg/m.  PHYSICAL EXAMNIATION:  Gen: NAD, conversant, well nourised, well groomed                     Cardiovascular: Regular rate rhythm, no peripheral edema, warm, nontender. Eyes: Conjunctivae clear without exudates or hemorrhage Neck: Supple, no carotid bruits. Pulmonary: Clear to auscultation bilaterally   NEUROLOGICAL EXAM:  MENTAL  STATUS: Speech/cognition: Awake, alert, oriented to history taking and casual conversation CRANIAL NERVES: CN II: Visual fields are full to confrontation. Pupils are round equal and briskly reactive to light. CN III, IV, VI: extraocular movement are normal. No ptosis. CN V: Facial sensation is intact to light touch CN VII: Face is symmetric with normal eye closure  CN VIII: Hearing is normal to causal conversation. CN IX, X: Phonation is normal. CN XI: Head turning and shoulder shrug are intact  MOTOR: Normal strength, bilateral wrist due to mild swelling  REFLEXES: Reflexes are 1 and symmetric at the biceps, triceps, knees, and ankles. Plantar responses are flexor.  SENSORY: Decreased to pinprick at fingerpads,  COORDINATION: There is no trunk or limb dysmetria noted.  GAIT/STANCE: Posture is normal. Gait is steady    REVIEW OF SYSTEMS:  Full 14 system review of systems performed and notable only for as above All other review of systems were negative.   ALLERGIES: Allergies  Allergen Reactions   Cefaclor Hives and Rash    HOME MEDICATIONS: Current Outpatient Medications  Medication Sig Dispense Refill   albuterol (VENTOLIN HFA) 108 (90 Base) MCG/ACT inhaler Inhale 1-2 puffs into the lungs every 6 (six) hours as needed for wheezing or shortness of breath. 6.7 g 0   allopurinol (ZYLOPRIM) 100 MG tablet Take 100 mg by mouth at bedtime.     amLODipine (NORVASC) 10 MG tablet Take 1 tablet (10 mg total) by mouth daily. 180 tablet 3   atorvastatin (LIPITOR) 20 MG tablet TAKE 1 TABLET  BY MOUTH ONCE  DAILY FOR CHOLESTEROL 90 tablet 2   Cholecalciferol (DIALYVITE VITAMIN D 5000) 125 MCG (5000 UT) capsule Take 5,000 Units by mouth daily.     clopidogrel (PLAVIX) 75 MG tablet Take 1 tablet (75 mg total) by mouth daily. START ON 06/05/22 AND STOP WARFARIN ON 06/04/22 90 tablet 3   cyanocobalamin 1000 MCG tablet Take 1,000 mcg by mouth daily. Vit B12     dofetilide (TIKOSYN) 125 MCG  capsule Take 1 capsule (125 mcg total) by mouth 2 (two) times daily. 180 capsule 3   famotidine (PEPCID) 20 MG tablet TAKE 1 TABLET BY MOUTH DAILY FOR HEARTBURN 90 tablet 1   FARXIGA 10 MG TABS tablet Take 10 mg by mouth every morning.     ferrous sulfate 325 (65 FE) MG tablet Take 650 mg by mouth See admin instructions. Every three weeks for one week     losartan (COZAAR) 100 MG tablet TAKE 1 TABLET BY MOUTH ONCE  DAILY FOR BLOOD PRESSURE 90 tablet 2   MAGnesium-Oxide 400 (240 Mg) MG tablet TAKE 1 TABLET(400 MG) BY MOUTH DAILY 90 tablet 3   predniSONE (DELTASONE) 20 MG tablet Take three tablets my mouth once daily in the morning for 3 days, then two tablets for 3 days, then 1 tablet for 3 days. (Patient taking differently: Take 20 mg by mouth as needed. Take three tablets my mouth once daily in the morning for 3 days, then two tablets for 3 days, then 1 tablet for 3 days.) 18 tablet 0   triamcinolone cream (KENALOG) 0.1 % Apply 1 Application topically daily as needed. Up to 5 days per week. Avoid face, groin, underarms. 80 g 3   No current facility-administered medications for this visit.    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 Quentin Ore   Psoriasis     PAST SURGICAL HISTORY: Past Surgical History:  Procedure Laterality Date   ATRIAL FIBRILLATION ABLATION N/A 01/03/2022   Procedure: ATRIAL FIBRILLATION ABLATION;  Surgeon: Vickie Epley, MD;  Location: [facility];  Service: Cardiovascular;  Laterality: N/A;   CESAREAN SECTION     3   CHOLECYSTECTOMY     HIP ARTHROPLASTY Left    KNEE ARTHROPLASTY Bilateral    LEFT ATRIAL APPENDAGE OCCLUSION N/A 04/20/2022    Procedure: LEFT ATRIAL APPENDAGE OCCLUSION;  Surgeon: Vickie Epley, MD;  Location: [facility];  Service: Cardiovascular;  Laterality: N/A;   TEE WITHOUT CARDIOVERSION N/A 04/20/2022   Procedure: TRANSESOPHAGEAL ECHOCARDIOGRAM (TEE);  Surgeon: Vickie Epley, MD;  Location: [facility];  Service: Cardiovascular;  Laterality: N/A;    FAMILY HISTORY: Family History  Problem Relation Age of Onset   Heart disease Mother     SOCIAL HISTORY: Social History   Socioeconomic History   Marital status: Widowed    Spouse name: Not on file   Number of children: Not on file   Years of education: Not on file   Highest education level: Not on file  Occupational History   Not on file  Tobacco Use   Smoking status: Former    Types: Cigarettes  Start date: 2001   Smokeless tobacco: Never  Vaping Use   Vaping Use: Never used  Substance and Sexual Activity   Alcohol use: Not on file   Drug use: Never   Sexual activity: Not Currently  Other Topics Concern   Not on file  Social History Narrative   Not on file   Social Determinants of Health   Financial Resource Strain: Low Risk  (04/03/2022)   Overall Financial Resource Strain (CARDIA)    Difficulty of Paying Living Expenses: Not hard at all  Food Insecurity: No Food Insecurity (04/03/2022)   Hunger Vital Sign    Worried About Running Out of Food in the Last Year: Never true    Ran Out of Food in the Last Year: Never true  Transportation Needs: No Transportation Needs (04/03/2022)   PRAPARE - Hydrologist (Medical): No    Lack of Transportation (Non-Medical): No  Physical Activity: Insufficiently Active (04/03/2022)   Exercise Vital Sign    Days of Exercise per Week: 4 days    Minutes of Exercise per Session: 30 min  Stress: No Stress Concern Present (04/03/2022)   [place]    Feeling of Stress : Not at all  Social  Connections: Socially Isolated (04/03/2022)   Social Connection and Isolation Panel [NHANES]    Frequency of Communication with Friends and Family: Three times a week    Frequency of Social Gatherings with Friends and Family: Three times a week    Attends Religious Services: Never    Active Member of Clubs or Organizations: No    Attends Archivist Meetings: Never    Marital Status: Widowed  Intimate Partner Violence: Not At Risk (04/03/2022)   Humiliation, Afraid, Rape, and Kick questionnaire    Fear of Current or Ex-Partner: No    Emotionally Abused: No    Physically Abused: No    Sexually Abused: No      Marcial Pacas, M.D. Ph.D.  [facility] [address], [place], [place] [address] Ph: [phone] Fax: [phone]  CC:  Owens Loffler, MD [place],  [place] [address]  Pleas Koch, NP

## 2022-09-25 ENCOUNTER — Ambulatory Visit
Admission: RE | Admit: 2022-09-25 | Discharge: 2022-09-25 | Disposition: A | Discharge status: Home / Self Care | Payer: Medicare Other | Source: Ambulatory Visit | Attending: Primary Care | Admitting: Primary Care

## 2022-09-25 DIAGNOSIS — Z1231 Encounter for screening mammogram for malignant neoplasm of breast: Secondary | ICD-10-CM

## 2022-09-25 DIAGNOSIS — E2839 Other primary ovarian failure: Secondary | ICD-10-CM | POA: Insufficient documentation

## 2022-09-26 ENCOUNTER — Other Ambulatory Visit: Payer: Self-pay | Admitting: Primary Care

## 2022-09-26 DIAGNOSIS — M810 Age-related osteoporosis without current pathological fracture: Secondary | ICD-10-CM

## 2022-09-26 MED ORDER — ALENDRONATE SODIUM 70 MG PO TABS: 70.0000 mg | ORAL_TABLET | ORAL | 2 refills | Status: DC

## 2022-09-28 ENCOUNTER — Other Ambulatory Visit: Payer: Self-pay | Admitting: *Deleted

## 2022-09-28 ENCOUNTER — Inpatient Hospital Stay
Admission: RE | Admit: 2022-09-28 | Discharge: 2022-09-28 | Disposition: A | Discharge status: Home / Self Care | Payer: Self-pay | Source: Ambulatory Visit | Attending: *Deleted | Admitting: *Deleted

## 2022-09-28 DIAGNOSIS — Z1231 Encounter for screening mammogram for malignant neoplasm of breast: Secondary | ICD-10-CM

## 2022-09-29 NOTE — Telephone Encounter (Signed)
I spoke with pt;pt said she took her first alendronate 70 mg 09/28/22 and pt took med with full glass of water on empty stomach and did not lay down for over 2 hrs after taking med. Pt said initially she was nauseated and then her heart began to beat really fast or 1 - 1 1/2 hrs. Pt not sure what heart rate was but just knows it was fast. Later yesterday afternoon had bad indigestion (No CP). Pt said she had ablation mid last year and has not had afib episode since then. Pt said alendronate can cause afib and pt does not want to take a chance of getting afib again and does not want to continue taking alendronate. Today pt said "she felt fine". [facility]. Pt request cb after note reviewed by Gentry Fitz NP. Sending note to Gentry Fitz NP and [employer].

## 2022-09-29 NOTE — Telephone Encounter (Signed)
Joellen,   Can you see if she qualifies for Prolia?  Allie Bossier, NP-C

## 2022-09-29 NOTE — Telephone Encounter (Signed)
Thank you for contacting patient.

## 2022-09-29 NOTE — Telephone Encounter (Signed)
Amy Underwood, can you contact patient?

## 2022-10-05 ENCOUNTER — Encounter: Payer: Self-pay | Admitting: Neurology

## 2022-10-10 ENCOUNTER — Telehealth: Payer: Self-pay

## 2022-10-10 DIAGNOSIS — M810 Age-related osteoporosis without current pathological fracture: Secondary | ICD-10-CM

## 2022-10-10 NOTE — Telephone Encounter (Signed)
Prolia VOB initiated via [email or web address] 

## 2022-10-11 ENCOUNTER — Encounter: Payer: Self-pay | Admitting: Family Medicine

## 2022-10-11 ENCOUNTER — Ambulatory Visit (INDEPENDENT_AMBULATORY_CARE_PROVIDER_SITE_OTHER): Payer: Medicare Other | Admitting: Family Medicine

## 2022-10-11 ENCOUNTER — Encounter: Payer: Self-pay | Admitting: Dermatology

## 2022-10-11 ENCOUNTER — Ambulatory Visit (INDEPENDENT_AMBULATORY_CARE_PROVIDER_SITE_OTHER): Payer: Medicare Other | Admitting: Dermatology

## 2022-10-11 VITALS — BP 133/80 | HR 79

## 2022-10-11 VITALS — BP 164/70 | HR 69 | Temp 98.2°F | Ht 63.0 in | Wt 180.5 lb

## 2022-10-11 DIAGNOSIS — Z79899 Other long term (current) drug therapy: Secondary | ICD-10-CM | POA: Diagnosis not present

## 2022-10-11 DIAGNOSIS — D692 Other nonthrombocytopenic purpura: Secondary | ICD-10-CM

## 2022-10-11 DIAGNOSIS — R200 Anesthesia of skin: Secondary | ICD-10-CM | POA: Diagnosis not present

## 2022-10-11 DIAGNOSIS — L409 Psoriasis, unspecified: Secondary | ICD-10-CM

## 2022-10-11 DIAGNOSIS — M19012 Primary osteoarthritis, left shoulder: Secondary | ICD-10-CM

## 2022-10-11 DIAGNOSIS — Z7189 Other specified counseling: Secondary | ICD-10-CM | POA: Diagnosis not present

## 2022-10-11 DIAGNOSIS — L578 Other skin changes due to chronic exposure to nonionizing radiation: Secondary | ICD-10-CM

## 2022-10-11 DIAGNOSIS — M19011 Primary osteoarthritis, right shoulder: Secondary | ICD-10-CM

## 2022-10-11 MED ORDER — TRIAMCINOLONE ACETONIDE 40 MG/ML IJ SUSP
40.0000 mg | Freq: Once | INTRAMUSCULAR | Status: AC
  Administered 2022-10-11: 40 mg via INTRA_ARTICULAR

## 2022-10-11 MED ORDER — PREGABALIN 75 MG PO CAPS: 75.0000 mg | ORAL_CAPSULE | Freq: Two times a day (BID) | ORAL | 3 refills | Status: DC

## 2022-10-11 NOTE — Patient Instructions (Signed)
Due to recent changes in healthcare laws, you may see results of your pathology and/or laboratory studies on MyChart before the doctors have had a chance to review them. We understand that in some cases there may be results that are confusing or concerning to you. Please understand that not all results are received at the same time and often the doctors may need to interpret multiple results in order to provide you with the best plan of care or course of treatment. Therefore, we ask that you please give us 2 business days to thoroughly review all your results before contacting the office for clarification. Should we see a critical lab result, you will be contacted sooner.   If You Need Anything After Your Visit  If you have any questions or concerns for your doctor, please call our main line at [phone] and press option 4 to reach your doctor's medical assistant. If no one answers, please leave a voicemail as directed and we will return your call as soon as possible. Messages left after 4 pm will be answered the following business day.   You may also send us a message via MyChart. We typically respond to MyChart messages within 1-2 business days.  For prescription refills, please ask your pharmacy to contact our office. Our fax number is [phone].  If you have an urgent issue when the clinic is closed that cannot wait until the next business day, you can page your doctor at the number below.    Please note that while we do our best to be available for urgent issues outside of office hours, we are not available 24/7.   If you have an urgent issue and are unable to reach us, you may choose to seek medical care at your doctor's office, retail clinic, urgent care center, or emergency room.  If you have a medical emergency, please immediately call 911 or go to the emergency department.  Pager Numbers  - Dr. Kowalski: [phone]  - Dr. Moye: [phone]  - Dr. Stewart:  [phone]  In the event of inclement weather, please call our main line at [phone] for an update on the status of any delays or closures.  Dermatology Medication Tips: Please keep the boxes that topical medications come in in order to help keep track of the instructions about where and how to use these. Pharmacies typically print the medication instructions only on the boxes and not directly on the medication tubes.   If your medication is too expensive, please contact our office at [phone] option 4 or send us a message through MyChart.   We are unable to tell what your co-pay for medications will be in advance as this is different depending on your insurance coverage. However, we may be able to find a substitute medication at lower cost or fill out paperwork to get insurance to cover a needed medication.   If a prior authorization is required to get your medication covered by your insurance company, please allow us 1-2 business days to complete this process.  Drug prices often vary depending on where the prescription is filled and some pharmacies may offer cheaper prices.  The website www.goodrx.com contains coupons for medications through different pharmacies. The prices here do not account for what the cost may be with help from insurance (it may be cheaper with your insurance), but the website can give you the price if you did not use any insurance.  - You can print the associated coupon and take it with   your prescription to the pharmacy.  - You may also stop by our office during regular business hours and pick up a GoodRx coupon card.  - If you need your prescription sent electronically to a different pharmacy, notify our office through [facility] MyChart or by phone at [phone] option 4.     Si Usted Necesita Algo Despus de Su Visita  Tambin puede enviarnos un mensaje a travs de MyChart. Por lo general respondemos a los mensajes de MyChart en el transcurso de 1 a 2  das hbiles.  Para renovar recetas, por favor pida a su farmacia que se ponga en contacto con nuestra oficina. Nuestro nmero de fax es el [phone].  Si tiene un asunto urgente cuando la clnica est cerrada y que no puede esperar hasta el siguiente da hbil, puede llamar/localizar a su doctor(a) al nmero que aparece a continuacin.   Por favor, tenga en cuenta que aunque hacemos todo lo posible para estar disponibles para asuntos urgentes fuera del horario de oficina, no estamos disponibles las 24 horas del da, los 7 das de la semana.   Si tiene un problema urgente y no puede comunicarse con nosotros, puede optar por buscar atencin mdica  en el consultorio de su doctor(a), en una clnica privada, en un centro de atencin urgente o en una sala de emergencias.  Si tiene una emergencia mdica, por favor llame inmediatamente al 911 o vaya a la sala de emergencias.  Nmeros de bper  - Dr. Kowalski: [phone]  - Dra. Moye: [phone]  - Dra. Stewart: [phone]  En caso de inclemencias del tiempo, por favor llame a nuestra lnea principal al [phone] para una actualizacin sobre el estado de cualquier retraso o cierre.  Consejos para la medicacin en dermatologa: Por favor, guarde las cajas en las que vienen los medicamentos de uso tpico para ayudarle a seguir las instrucciones sobre dnde y cmo usarlos. Las farmacias generalmente imprimen las instrucciones del medicamento slo en las cajas y no directamente en los tubos del medicamento.   Si su medicamento es muy caro, por favor, pngase en contacto con nuestra oficina llamando al [phone] y presione la opcin 4 o envenos un mensaje a travs de MyChart.   No podemos decirle cul ser su copago por los medicamentos por adelantado ya que esto es diferente dependiendo de la cobertura de su seguro. Sin embargo, es posible que podamos encontrar un medicamento sustituto a menor costo o llenar un formulario para que el  seguro cubra el medicamento que se considera necesario.   Si se requiere una autorizacin previa para que su compaa de seguros cubra su medicamento, por favor permtanos de 1 a 2 das hbiles para completar este proceso.  Los precios de los medicamentos varan con frecuencia dependiendo del lugar de dnde se surte la receta y alguna farmacias pueden ofrecer precios ms baratos.  El sitio web www.goodrx.com tiene cupones para medicamentos de diferentes farmacias. Los precios aqu no tienen en cuenta lo que podra costar con la ayuda del seguro (puede ser ms barato con su seguro), pero el sitio web puede darle el precio si no utiliz ningn seguro.  - Puede imprimir el cupn correspondiente y llevarlo con su receta a la farmacia.  - Tambin puede pasar por nuestra oficina durante el horario de atencin regular y recoger una tarjeta de cupones de GoodRx.  - Si necesita que su receta se enve electrnicamente a una farmacia diferente, informe a nuestra oficina a travs de MyChart de [place]   o por telfono llamando al [phone] y presione la opcin 4.  

## 2022-10-11 NOTE — Telephone Encounter (Signed)
I spoke to [employer].  Patient had an adverse reaction to Fosamax, but has not tried any IV bisphosphates.

## 2022-10-11 NOTE — Telephone Encounter (Signed)
Submitting PA for Prolia. Have patient tried and failed an IV bisphosphate?

## 2022-10-11 NOTE — Progress Notes (Signed)
   Follow-Up Visit   Subjective  Amy Underwood is a 77 y.o. female who presents for the following: Psoriasis and Skin Problem.  6 months f/u on psoriasis on the elbows, knees, hands treating with Triamcinolone cream with a poor response.  The following portions of the chart were reviewed this encounter and updated as appropriate:   Tobacco  Allergies  Meds  Problems  Med Hx  Surg Hx  Fam Hx     Review of Systems:  No other skin or systemic complaints except as noted in HPI or Assessment and Plan.  Objective  Well appearing patient in no apparent distress; mood and affect are within normal limits.  A focused examination was performed including elbows, knees. Relevant physical exam findings are noted in the Assessment and Plan.  elbows, knees, hands Well-demarcated erythematous papules/plaques with silvery scale, guttate pink scaly papules.               Assessment & Plan  Psoriasis elbows, knees, hands See photos Counseling on psoriasis and coordination of care  psoriasis is a chronic non-curable, but treatable genetic/hereditary disease that may have other systemic features affecting other organ systems such as joints (Psoriatic Arthritis). It is associated with an increased risk of inflammatory bowel disease, heart disease, non-alcoholic fatty liver disease, and depression.  Treatments include light and laser treatments; topical medications; and systemic medications including oral and injectables.   15% BSA  Start sample of Otezla  Titrate from 10 mg to 20 to 30 mg daily. Discussed potential side effects in detail. (2 sample packs given) Side effects of Otezla (apremilast) include diarrhea, nausea, headache, upper respiratory infection, depression, and weight decrease (5-10%). It should only be taken by pregnant women after a discussion regarding risks and benefits with their doctor. Goal is control of skin condition, not cure.  The use of [place] Nail requires long term  medication management, including periodic office visits.   Related Medications triamcinolone cream (KENALOG) 0.1 % Apply 1 Application topically daily as needed. Up to 5 days per week. Avoid face, groin, underarms.  Purpura - Chronic; persistent and recurrent.  Treatable, but not curable. - Violaceous macules and patches - Benign - Related to trauma, age, sun damage and/or use of blood thinners, chronic use of topical and/or oral steroids - Observe - Can use OTC arnica containing moisturizer such as Dermend Bruise Formula if desired - Call for worsening or other concerns   Actinic Damage - chronic, secondary to cumulative UV radiation exposure/sun exposure over time - diffuse scaly erythematous macules with underlying dyspigmentation - Recommend daily broad spectrum sunscreen SPF 30+ to sun-exposed areas, reapply every 2 hours as needed.  - Recommend staying in the shade or wearing long sleeves, sun glasses (UVA+UVB protection) and wide brim hats (4-inch brim around the entire circumference of the hat). - Call for new or changing lesions.   Return in about 5 weeks (around 11/15/2022) for Psoriasis .  IMarye Round, CMA, am acting as scribe for Sarina Ser, MD .  Documentation: I have reviewed the above documentation for accuracy and completeness, and I agree with the above.  Sarina Ser, MD

## 2022-10-11 NOTE — Progress Notes (Signed)
Amy Underwood T. Tatsuo Musial, MD, [place] at [facility] [place], [address]  Phone: [phone]  FAX: [phone]  Amy Underwood - 77 y.o. female  MRN [id number]  Date of Birth: Mar 27, 1946  Date: 10/11/2022  PCP: Amy Koch, NP  Referral: Amy Koch, NP  Chief Complaint  Patient presents with   Shoulder Pain    Bilateral   Numbness    Hands   Subjective:   Amy Underwood is a 77 y.o. very pleasant female patient with Body mass index is 31.97 kg/m. who presents with the following:  She presents with 2 primary complaints.  First, she has known extensive bilateral shoulder osteoarthritis, and she is here with the shoulder bilateral exacerbation.  She also has been plagued with bilateral hand numbness and pain greater than 1 year.  She is already seen multiple other providers for this, and she is currently on gabapentin and Cymbalta.  She does not think that this is helped at all.  She has upcoming EMGs and nerve conduction studies from neurology, and they are involved in the case.  10/2022 Neurology appointment is coming up for CTS evaluation.  EMG/NCV is pending, on Gabapentin and cymbalta.  But she stopped both meds.  12/2022 EMG/NCV pending.  She is having a great deal of trouble with the pain  Moderate GH joint OA B.  - shoulders are hurting really bad.  Had great benefit from shoulder injections in the past.   Severe loss of motion  GH OA - inject b  Review of Systems is noted in the HPI, as appropriate  Objective:   BP (!) 164/70   Pulse 69   Temp 98.2 F (36.8 C) (Temporal)   Ht 5' 3"$  (1.6 m)   Wt 180 lb 8 oz (81.9 kg)   SpO2 98%   BMI 31.97 kg/m   GEN: No acute distress; alert,appropriate. PULM: Breathing comfortably in no respiratory distress PSYCH: Normally interactive.   Bilateral shoulder exam.  Extensive loss of motion with active range of motion on the right side to 90 degrees  and then to 110 degrees on the left.  I did not attempt additional range of motion due to guarding and pain. Rotational maneuvers of the shoulder are more preserved. Strength is 4-4 minus/5 in all directions Grip is intact Additional special testing of the shoulder is limited due to loss of motion and pain  Laboratory and Imaging Data:  Assessment and Plan:     ICD-10-CM   1. Arthritis of both glenohumeral joints  M19.011 triamcinolone acetonide (KENALOG-40) injection 40 mg   M19.012 triamcinolone acetonide (KENALOG-40) injection 40 mg    2. Bilateral hand numbness  R20.0      Acute on chronic shoulder osteoarthritis with exacerbation.  Last time I did do intra-articular injections for the patient, she had some very significant benefit.  She understands that this is going to be a challenge off and on forever, but she would like to have additional intervention today.  Bilateral hand numbness, multifactorial versus carpal tunnel syndrome.  My suspicion is this is more than carpal tunnel syndrome, and I think that the EMGs and nerve conduction studies that she has upcoming will help determine a complete cause.  Given the poor response to gabapentin, I am going to have her stop gabapentin and start Lyrica.  The Lyrica could be titrated up quite significantly before considering a treatment failure.  Intraarticular Shoulder Aspiration/Injection Procedure Note Amy  Underwood 01-30-1946 Date of procedure: 10/11/2022  Procedure: Large Joint Aspiration / Injection of Shoulder, Intraarticular, R Indications: Pain  Procedure Details Verbal consent was obtained from the patient. Risks explained and contrasted with benefits and alternatives. Patient prepped with Chloraprep and Ethyl Chloride used for anesthesia. An intraarticular shoulder injection was performed using the posterior approach; needle placed into joint capsule without difficulty. The patient tolerated the procedure well and had decreased  pain post injection. No complications. Injection: 9 cc of Lidocaine 1% and 1 mL Kenalog 40 mg. Needle: 21 gauge, 2 inch   Intraarticular Shoulder Aspiration/Injection Procedure Note Amy Underwood 02/21/1946 Date of procedure: 10/11/2022  Procedure: Large Joint Aspiration / Injection of Shoulder, Intraarticular, L Indications: Pain  Procedure Details Verbal consent was obtained from the patient. Risks explained and contrasted with benefits and alternatives. Patient prepped with Chloraprep and Ethyl Chloride used for anesthesia. An intraarticular shoulder injection was performed using the posterior approach; needle placed into joint capsule without difficulty. The patient tolerated the procedure well and had decreased pain post injection. No complications. Injection: 9 cc of Lidocaine 1% and 1 mL Kenalog 40 mg. Needle: 21 gauge, 2 inch   Medication Management during today's office visit: Meds ordered this encounter  Medications   pregabalin (LYRICA) 75 MG capsule    Sig: Take 1 capsule (75 mg total) by mouth 2 (two) times daily.    Dispense:  60 capsule    Refill:  3   triamcinolone acetonide (KENALOG-40) injection 40 mg   triamcinolone acetonide (KENALOG-40) injection 40 mg   Medications Discontinued During This Encounter  Medication Reason   predniSONE (DELTASONE) 20 MG tablet Completed Course   gabapentin (NEURONTIN) 100 MG capsule     Orders placed today for conditions managed today: No orders of the defined types were placed in this encounter.   Disposition: As needed only  Dragon [employer] One speech-to-text software was used for transcription in this dictation.  Possible transcriptional errors can occur using Editor, commissioning.   Signed,  Amy Deed. Ruhaan Nordahl, MD   Outpatient Encounter Medications as of 10/11/2022  Medication Sig   albuterol (VENTOLIN HFA) 108 (90 Base) MCG/ACT inhaler Inhale 1-2 puffs into the lungs every 6 (six) hours as needed for wheezing or shortness  of breath.   allopurinol (ZYLOPRIM) 100 MG tablet Take 100 mg by mouth at bedtime.   amLODipine (NORVASC) 10 MG tablet Take 1 tablet (10 mg total) by mouth daily.   Apremilast (OTEZLA) 10 & 20 & 30 MG TBPK Take 10 mg x 1 week, then 20 mg x 1 week, then 30 mg x 3 weeks   atorvastatin (LIPITOR) 20 MG tablet TAKE 1 TABLET BY MOUTH ONCE  DAILY FOR CHOLESTEROL   Calcium-Magnesium-Vitamin D (CALCIUM 1200+D3 PO) Take 1 tablet by mouth daily.   Cholecalciferol (DIALYVITE VITAMIN D 5000) 125 MCG (5000 UT) capsule Take 5,000 Units by mouth daily.   clopidogrel (PLAVIX) 75 MG tablet Take 1 tablet (75 mg total) by mouth daily. START ON 06/05/22 AND STOP WARFARIN ON 06/04/22   cyanocobalamin 1000 MCG tablet Take 1,000 mcg by mouth daily. Vit B12   dofetilide (TIKOSYN) 125 MCG capsule Take 1 capsule (125 mcg total) by mouth 2 (two) times daily.   DULoxetine (CYMBALTA) 30 MG capsule Take 1 capsule (30 mg total) by mouth daily.   famotidine (PEPCID) 20 MG tablet TAKE 1 TABLET BY MOUTH DAILY FOR HEARTBURN   FARXIGA 10 MG TABS tablet Take 10 mg by mouth every morning.  ferrous sulfate 325 (65 FE) MG tablet Take 650 mg by mouth See admin instructions. Every three weeks for one week   losartan (COZAAR) 100 MG tablet TAKE 1 TABLET BY MOUTH ONCE  DAILY FOR BLOOD PRESSURE   MAGnesium-Oxide 400 (240 Mg) MG tablet TAKE 1 TABLET(400 MG) BY MOUTH DAILY   pregabalin (LYRICA) 75 MG capsule Take 1 capsule (75 mg total) by mouth 2 (two) times daily.   triamcinolone cream (KENALOG) 0.1 % Apply 1 Application topically daily as needed. Up to 5 days per week. Avoid face, groin, underarms.   [DISCONTINUED] gabapentin (NEURONTIN) 100 MG capsule Take 2 capsules (200 mg total) by mouth 3 (three) times daily.   [DISCONTINUED] predniSONE (DELTASONE) 20 MG tablet Take three tablets my mouth once daily in the morning for 3 days, then two tablets for 3 days, then 1 tablet for 3 days. (Patient taking differently: Take 20 mg by mouth as  needed. Take three tablets my mouth once daily in the morning for 3 days, then two tablets for 3 days, then 1 tablet for 3 days.)   [EXPIRED] triamcinolone acetonide (KENALOG-40) injection 40 mg    [EXPIRED] triamcinolone acetonide (KENALOG-40) injection 40 mg    No facility-administered encounter medications on file as of 10/11/2022.

## 2022-10-13 NOTE — Telephone Encounter (Signed)
Spoke to [facility] representative, Member would have to try and fail both oral and intravenous bisphosphonates.

## 2022-10-16 NOTE — Telephone Encounter (Signed)
Please call patient:  Let her know that her insurance denied coverage for Prolia injections for osteoporosis.   There is another option called Boniva. This is a pill that's taken once MONTHLY. It's within the same family as the Fosamax she took last time, but it may not cause those same reactions.  Let me know if interested. If not then I recommend we refer her to rheumatology/elsewhere for management of osteoporosis.

## 2022-10-16 NOTE — Telephone Encounter (Signed)
Please see previous msg.  Prolia denied.

## 2022-10-17 MED ORDER — IBANDRONATE SODIUM 150 MG PO TABS: 150.0000 mg | ORAL_TABLET | ORAL | 3 refills | Status: DC

## 2022-10-17 NOTE — Telephone Encounter (Signed)
Noted, Rx sent to mail order pharmacy.

## 2022-10-17 NOTE — Addendum Note (Signed)
Addended by: Pleas Koch on: 10/17/2022 09:46 AM   Modules accepted: Orders

## 2022-10-17 NOTE — Telephone Encounter (Signed)
Called and spoke with patient. Reviewed denial of coverage. Patient would like to try Boniva.

## 2022-10-26 NOTE — Progress Notes (Signed)
[facility]                                     Cardiology Office Note:    Date:  10/27/2022   ID:  [name], DOB 1945/12/15, MRN [id number]  PCP:  Pleas Koch, NP  [facility] Cardiologist:  None  [facility] Electrophysiologist:  Vickie Epley, MD   Referring MD: Pleas Koch, NP   Chief Complaint  Patient presents with   Follow-up    6 month s/p LAAO   History of Present Illness:    Amy Underwood is a 77 y.o. female with a hx of paroxysmal atrial fibrillation s/p ablation 01/2022, HTN, HLD, psoriasis, anemia, and IDA.   She was initially diagnosed with atrial fibrillation remotely and has been maintained on dofetilide. She underwent afib and flutter ablation 01/03/22 and was taking warfarin for anticoagulation. She was felt to be a poor candidate for long-term anticoagulation due to issues with cost and maintaining therapeutic ranges of INRs while on warfarin. She was seen by Dr. Quentin Ore with plans to pursue Watchman workup. The patient was underwent pre procedure CT imaging which showed anatomy suitable for Watchman implant.   She is now s/p device placement with 39m Watchman. She was resumed on Coumadin for 45 days and transitioned to Plavix '75mg'$  to complete 6 months of post implant therapy. Post implant CT showed stable device with no leak or thrombus.    Today she reports that she is doing well with no complaints. She denies chest pain, palpitations, orthopnea, LE edema, dizziness, or syncope. No bleeding in stool or urine.   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 LaQuentin Ore Psoriasis     Past Surgical History:  Procedure Laterality Date    ATRIAL FIBRILLATION ABLATION N/A 01/03/2022   Procedure: ATRIAL FIBRILLATION ABLATION;  Surgeon: LaVickie EpleyMD;  [facility];  Service: Cardiovascular;  Laterality: N/A;   CESAREAN SECTION     3   CHOLECYSTECTOMY     HIP ARTHROPLASTY Left    KNEE ARTHROPLASTY Bilateral    LEFT ATRIAL APPENDAGE OCCLUSION N/A 04/20/2022   Procedure: LEFT ATRIAL APPENDAGE OCCLUSION;  Surgeon: LaVickie EpleyMD;  [facility];  Service: Cardiovascular;  Laterality: N/A;   TEE WITHOUT CARDIOVERSION N/A 04/20/2022   Procedure: TRANSESOPHAGEAL ECHOCARDIOGRAM (TEE);  Surgeon: LaVickie EpleyMD;  [facility];  Service: Cardiovascular;  Laterality: N/A;    Current Medications: Current Meds  Medication Sig   albuterol (VENTOLIN HFA) 108 (90 Base) MCG/ACT inhaler Inhale 1-2 puffs into the lungs every 6 (six) hours as needed for wheezing or shortness of breath.   allopurinol (ZYLOPRIM) 100 MG tablet Take 100 mg by mouth at bedtime.   amLODipine (NORVASC) 10 MG tablet Take 1 tablet (10 mg total) by mouth daily.   Apremilast (OTEZLA) 10 & 20 & 30 MG TBPK Take 10 mg x 1 week, then 20 mg x 1 week, then 30 mg x 3 weeks   atorvastatin (LIPITOR)  20 MG tablet TAKE 1 TABLET BY MOUTH ONCE  DAILY FOR CHOLESTEROL   Calcium-Magnesium-Vitamin D (CALCIUM 1200+D3 PO) Take 1 tablet by mouth daily.   Cholecalciferol (DIALYVITE VITAMIN D 5000) 125 MCG (5000 UT) capsule Take 5,000 Units by mouth daily.   clopidogrel (PLAVIX) 75 MG tablet Take 1 tablet (75 mg total) by mouth daily. START ON 06/05/22 AND STOP WARFARIN ON 06/04/22   cyanocobalamin 1000 MCG tablet Take 1,000 mcg by mouth daily. Vit B12   dofetilide (TIKOSYN) 125 MCG capsule Take 1 capsule (125 mcg total) by mouth 2 (two) times daily.   DULoxetine (CYMBALTA) 30 MG capsule Take 1 capsule (30 mg total) by mouth daily.   famotidine (PEPCID) 20 MG tablet TAKE 1 TABLET BY MOUTH DAILY FOR HEARTBURN   FARXIGA 10 MG TABS  tablet Take 10 mg by mouth every morning.   ferrous sulfate 325 (65 FE) MG tablet Take 650 mg by mouth See admin instructions. Every three weeks for one week   ibandronate (BONIVA) 150 MG tablet Take 1 tablet (150 mg total) by mouth every 30 (thirty) days. Take in the morning with a full glass of water, on an empty stomach, and do not take anything else by mouth or lie down for the next 30 min.   losartan (COZAAR) 100 MG tablet TAKE 1 TABLET BY MOUTH ONCE  DAILY FOR BLOOD PRESSURE   MAGnesium-Oxide 400 (240 Mg) MG tablet TAKE 1 TABLET(400 MG) BY MOUTH DAILY   pregabalin (LYRICA) 75 MG capsule Take 1 capsule (75 mg total) by mouth 2 (two) times daily.   triamcinolone cream (KENALOG) 0.1 % Apply 1 Application topically daily as needed. Up to 5 days per week. Avoid face, groin, underarms.     Allergies:   Alendronate and Cefaclor   Social History   Socioeconomic History   Marital status: Widowed    Spouse name: Not on file   Number of children: Not on file   Years of education: Not on file   Highest education level: Not on file  Occupational History   Not on file  Tobacco Use   Smoking status: Former    Types: Cigarettes    Start date: 2001   Smokeless tobacco: Never  Vaping Use   Vaping Use: Never used  Substance and Sexual Activity   Alcohol use: Not on file   Drug use: Never   Sexual activity: Not Currently  Other Topics Concern   Not on file  Social History Narrative   Not on file   Social Determinants of Health   Financial Resource Strain: Low Risk  (04/03/2022)   Overall Financial Resource Strain (CARDIA)    Difficulty of Paying Living Expenses: Not hard at all  Food Insecurity: No Food Insecurity (04/03/2022)   Hunger Vital Sign    Worried About Running Out of Food in the Last Year: Never true    Ran Out of Food in the Last Year: Never true  Transportation Needs: No Transportation Needs (04/03/2022)   PRAPARE - Hydrologist (Medical): No     Lack of Transportation (Non-Medical): No  Physical Activity: Insufficiently Active (04/03/2022)   Exercise Vital Sign    Days of Exercise per Week: 4 days    Minutes of Exercise per Session: 30 min  Stress: No Stress Concern Present (04/03/2022)   [place]    Feeling of Stress : Not at all  Social Connections: Socially Isolated (04/03/2022)  Social Connection and Isolation Panel [NHANES]    Frequency of Communication with Friends and Family: Three times a week    Frequency of Social Gatherings with Friends and Family: Three times a week    Attends Religious Services: Never    Active Member of Clubs or Organizations: No    Attends Archivist Meetings: Never    Marital Status: Widowed    Family History: The patient's family history includes Heart disease in her mother. There is no history of Breast cancer.  ROS:   Please see the history of present illness.    All other systems reviewed and are negative.  EKGs/Labs/Other Studies Reviewed:    The following studies were reviewed today:  CT July 11, 2022:  IMPRESSION: 1. Well placed 31 mm Watchman FLX with no leak, full endothelialization and average compression 18%   2.  Moderate bi atrial enlargement   3. 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  Normal ascending thoracic aorta 3.5 cm   5.  No perciardial effusion   6.  Normal PV anatomy measurements above  EKG:  EKG is not ordered today.    Recent Labs: 11/30/2021: Magnesium 1.9 05/24/2022: BUN 29; Creatinine, Ser 1.31; Hemoglobin 13.0; Platelets 168; Potassium 4.2; Sodium 142   Recent Lipid Panel    Component Value Date/Time   CHOL 164 06/01/2022 0929   TRIG 82.0 06/01/2022 0929   HDL 84.60 06/01/2022 0929   CHOLHDL 2 06/01/2022 0929   VLDL 16.4 06/01/2022 0929   LDLCALC 63 06/01/2022 0929   Physical Exam:    VS:  BP 122/66   Pulse 67   Ht '5\' 3"'$  (1.6  m)   Wt 181 lb (82.1 kg)   SpO2 98%   BMI 32.06 kg/m     Wt Readings from Last 3 Encounters:  10/27/22 181 lb (82.1 kg)  10/11/22 180 lb 8 oz (81.9 kg)  09/21/22 174 lb (78.9 kg)    General: Well developed, well nourished, NAD Lungs:Clear to ausculation bilaterally. No wheezes, rales, or rhonchi. Breathing is unlabored. Cardiovascular: RRR with S1 S2. No murmurs Extremities: No edema. Neuro: Alert and oriented. No focal deficits. No facial asymmetry. MAE spontaneously. Psych: Responds to questions appropriately with normal affect.    ASSESSMENT/PLAN:    PAF: s/p LAAO with 31m Watchman device and restarted on Coumadin for 45 days then transitioned to Plavix '75mg'$  QD. She is now 6 months post implant and is able to stop Plavix. She is asking to stop Tikosyn therefore I have referred her back to speak to [place].     Medication Adjustments/Labs and Tests Ordered: Current medicines are reviewed at length with the patient today.  Concerns regarding medicines are outlined above.  No orders of the defined types were placed in this encounter.  No orders of the defined types were placed in this encounter.   Patient Instructions  Medication Instructions:  Your physician recommends that you continue on your current medications as directed. Please refer to the Current Medication list given to you today.  *If you need a refill on your cardiac medications before your next appointment, please call your pharmacy*   Lab Work: NONE If you have labs (blood work) drawn today and your tests are completely normal, you will receive your results only by: [place](if you have MyChart) OR A paper copy in the mail If you have any lab test that is abnormal or we  need to change your treatment, we will call you to review the results.   Testing/Procedures: NONE   Follow-Up: At [facility], you and your health needs are our priority.  As part of  our continuing mission to provide you with exceptional heart care, we have created designated Provider Care Teams.  These Care Teams include your primary Cardiologist (physician) and Advanced Practice Providers (APPs -  Physician Assistants and Nurse Practitioners) who all work together to provide you with the care you need, when you need it.  We recommend signing up for the patient portal called "MyChart".  Sign up information is provided on this After Visit Summary.  MyChart is used to connect with patients for Virtual Visits (Telemedicine).  Patients are able to view lab/test results, encounter notes, upcoming appointments, etc.  Non-urgent messages can be sent to your provider as well.   To learn more about what you can do with MyChart, go to [email or web address].    Your next appointment:   NEXT AVAILABLE   Provider:   Lars Mage, MD  IN [place]   Signed, [name], NP  10/27/2022 1:08 PM    [facility]

## 2022-10-27 ENCOUNTER — Ambulatory Visit: Payer: Medicare Other | Attending: Cardiology | Admitting: Cardiology

## 2022-10-27 VITALS — BP 122/66 | HR 67 | Ht 63.0 in | Wt 181.0 lb

## 2022-10-27 DIAGNOSIS — Z7901 Long term (current) use of anticoagulants: Secondary | ICD-10-CM | POA: Diagnosis not present

## 2022-10-27 DIAGNOSIS — Z95818 Presence of other cardiac implants and grafts: Secondary | ICD-10-CM | POA: Diagnosis not present

## 2022-10-27 DIAGNOSIS — I480 Paroxysmal atrial fibrillation: Secondary | ICD-10-CM | POA: Diagnosis not present

## 2022-10-27 NOTE — Patient Instructions (Signed)
Medication Instructions:  Your physician recommends that you continue on your current medications as directed. Please refer to the Current Medication list given to you today.  *If you need a refill on your cardiac medications before your next appointment, please call your pharmacy*   Lab Work: NONE If you have labs (blood work) drawn today and your tests are completely normal, you will receive your results only by: [place] (if you have MyChart) OR A paper copy in the mail If you have any lab test that is abnormal or we need to change your treatment, we will call you to review the results.   Testing/Procedures: NONE   Follow-Up: At [facility], you and your health needs are our priority.  As part of our continuing mission to provide you with exceptional heart care, we have created designated Provider Care Teams.  These Care Teams include your primary Cardiologist (physician) and Advanced Practice Providers (APPs -  Physician Assistants and Nurse Practitioners) who all work together to provide you with the care you need, when you need it.  We recommend signing up for the patient portal called "MyChart".  Sign up information is provided on this After Visit Summary.  MyChart is used to connect with patients for Virtual Visits (Telemedicine).  Patients are able to view lab/test results, encounter notes, upcoming appointments, etc.  Non-urgent messages can be sent to your provider as well.   To learn more about what you can do with MyChart, go to [email or web address].    Your next appointment:   NEXT AVAILABLE   Provider:   Lars Mage, MD  IN [place]

## 2022-11-15 ENCOUNTER — Ambulatory Visit: Payer: Medicare Other | Admitting: Dermatology

## 2022-11-21 NOTE — Progress Notes (Addendum)
[name], MD, CAQ Sports Medicine [facility] at [facility] [address] [place], [address]  Phone: [phone]  FAX: [phone]  Amy Underwood - 77 y.o. female  MRN [id number]  Date of Birth: [birth year]  Date: 11/22/2022  PCP: [name], NP  Referral: [name], NP  Chief Complaint  Patient presents with   Shoulder Pain    Right   Numbness   Subjective:   Amy Underwood is a 77 y.o. very pleasant female patient with Body mass index is 33.32 kg/m. who presents with the following:  She is here to follow-up on shoulder pain.  I saw her for bilateral glenohumeral joint osteoarthritis on October 11, 2022.  At that point I did do bilateral glenohumeral joint injections.  Her left shoulder feels better.  The right-sided glenohumeral joint has mild joint space narrowing with consistency with mild glenohumeral joint osteoarthritis.  She has severe AC joint arthritis with osteophyte formation.  Subacromial space is narrowed, as well.  There is profound undersurface bone hypertrophy at the acromion.  Right arm will hurt, and at nighttime, will go dead and numb.  Will wake up and have no feeling in her arm.   If she moves it backwards and forth.  Now shoulder will hurt in the front now.   She is having severe pain with abduction and flexion and some weakness that is developed intervally.  She has been doing some basic range of motion and strengthening that have given her in the past.  Right short order essentially hurts all the time.  She has pain in the front and in the deep shoulder has an ache with any kind of motion.  Her motion has progressively gotten even worse.  She has not had an additional injury.  Review of Systems is noted in the HPI, as appropriate  Objective:   BP 130/60   Pulse 62   Temp 97.9 F (36.6 C) (Temporal)   Ht 5\' 3"  (1.6 m)   Wt 188 lb 2 oz (85.3 kg)   SpO2 97%   BMI 33.32 kg/m   GEN: No acute  distress; alert,appropriate. PULM: Breathing comfortably in no respiratory distress PSYCH: Normally interactive.    Shoulder: R Inspection: No muscle wasting or winging Ecchymosis/edema: neg  AC joint, scapula, clavicle: AC joint and bicipital groove are tender to palpation Cervical spine: NT, full ROM Spurling's: neg Abduction: She is able to actively abduct her shoulder to 70 degrees, and strength is 3+ to 4 -/5  flexion: Flexion to 80 degrees, 4/5 IR, 0 degrees.  4+/5 ER at neutral: 0 degrees, 4+/5  AC crossover and compression: Pain, unable to complete Neer: Pain Hawkins: Pain Drop Test: Positive Empty Can: Pain Supraspinatus insertion: Pain Bicipital groove: Pain Speed's: Pain Yergason's: Pain Sulcus sign: neg Scapular dyskinesis: none C5-T1 intact Sensation intact Grip 5/5   Laboratory and Imaging Data: CLINICAL DATA:  Chronic shoulder pain.   EXAM: RIGHT SHOULDER - 2+ VIEW   COMPARISON:  None Available.   FINDINGS: There is no acute fracture or dislocation. There are mild degenerative changes with joint space narrowing of the glenohumeral joint. There is moderate severe joint space narrowing of the acromioclavicular joint with osteophyte formation. There is subacromial joint space narrowing. Soft tissues are within normal limits.   IMPRESSION: 1. No acute fracture or dislocation. 2. Moderate to severe degenerative changes of the acromioclavicular joint. 3. Mild degenerative changes of the glenohumeral joint.  Electronically Signed   By: Darliss Cheney M.D.   On: 06/06/2022 16:21  Assessment and Plan:     ICD-10-CM   1. Nontraumatic complete tear of right rotator cuff  M75.121 Ambulatory referral to Orthopedic Surgery    2. Chronic right shoulder pain  M25.511 MR Shoulder Right Wo Contrast   G89.29     3. Numbness and tingling of right arm  R20.0    R20.2      Severe, unrelenting pain, worsening of motion, worsening of strength over time.   This is all on the right shoulder, as the left shoulder is improved.  Failure to improve with all manner of conservative care including NSAIDs, Tylenol, intra-articular injection, home rehab and she continues to do quite poorly.  Positive drop Test.  Range of motion is markedly abnormal.  Strength testing in the planes of abduction and flexion is markedly abnormal.  Obtain an MRI of the right shoulder to evaluate for full-thickness rotator cuff tear.  Given her tingling and numbness in the right that is worsened, I am also going to increase her Lyrica.  She does think the Lyrica help with this tingling pain.  She does have ongoing workup by neurology and an appointment with them in several weeks.  Addendum: 12/12/22 10:38 AM  At this point, the patient's MRI of her shoulder has returned and she has a markedly abnormal shoulder MRI with a large supraspinatus tear with 3.7 cm retraction.  This is a very challenging injury.  I am going to have the patient see a dedicated shoulder surgeon or his expertise regarding this injury.  Anticipate operative intervention.  MR Shoulder Right Wo Contrast  Result Date: 12/11/2022 CLINICAL DATA:  Right arm and hand numbness. EXAM: MRI OF THE RIGHT SHOULDER WITHOUT CONTRAST TECHNIQUE: Multiplanar, multisequence MR imaging of the shoulder was performed. No intravenous contrast was administered. COMPARISON:  None Available. FINDINGS: Rotator cuff: Complete tear of the supraspinatus tendon with 3.7 cm of retraction. Moderate tendinosis of the infraspinatus tendon with a small full-thickness tear anteriorly. Teres minor tendon is intact. Severe tendinosis of the subscapularis tendon. Muscles: No muscle atrophy or edema. No intramuscular fluid collection or hematoma. Biceps Long Head: Complete tear of the proximal long head of the biceps tendon. Acromioclavicular Joint: Severe arthropathy of the acromioclavicular joint. Trace subacromial/subdeltoid bursal fluid. Glenohumeral  Joint: Small joint effusion. Mild partial-thickness cartilage loss of the glenohumeral joint. Labrum: Grossly intact, but evaluation is limited by lack of intraarticular fluid/contrast. Bones: No fracture or dislocation. No aggressive osseous lesion. Other: No fluid collection or hematoma. IMPRESSION: 1. Complete tear of the supraspinatus tendon with 3.7 cm of retraction. 2. Moderate tendinosis of the infraspinatus tendon with a small full-thickness tear anteriorly. 3. Severe tendinosis of the subscapularis tendon. 4. Complete tear of the proximal long head of the biceps tendon. Electronically Signed   By: Elige Ko M.D.   On: 12/11/2022 06:37     Medication Management during today's office visit: Meds ordered this encounter  Medications   pregabalin (LYRICA) 100 MG capsule    Sig: Take 1 capsule (100 mg total) by mouth 2 (two) times daily.    Dispense:  60 capsule    Refill:  3   traMADol (ULTRAM) 50 MG tablet    Sig: Take 1 tablet (50 mg total) by mouth every 8 (eight) hours as needed for moderate pain.    Dispense:  20 tablet    Refill:  0   Medications Discontinued During This Encounter  Medication Reason   pregabalin (LYRICA) 75 MG capsule Reorder    Orders placed today for conditions managed today: Orders Placed This Encounter  Procedures   MR Shoulder Right Wo Contrast   Ambulatory referral to Orthopedic Surgery    Disposition: No follow-ups on file.  Dragon [employer] One speech-to-text software was used for transcription in this dictation.  Possible transcriptional errors can occur using Animal nutritionist.   Signed,  Elpidio Galea. Roslyn Else, MD   Outpatient Encounter Medications as of 11/22/2022  Medication Sig   albuterol (VENTOLIN HFA) 108 (90 Base) MCG/ACT inhaler Inhale 1-2 puffs into the lungs every 6 (six) hours as needed for wheezing or shortness of breath.   allopurinol (ZYLOPRIM) 100 MG tablet Take 100 mg by mouth at bedtime.   amLODipine (NORVASC) 10 MG tablet Take 1  tablet (10 mg total) by mouth daily.   Apremilast (OTEZLA) 10 & 20 & 30 MG TBPK Take 10 mg x 1 week, then 20 mg x 1 week, then 30 mg x 3 weeks   atorvastatin (LIPITOR) 20 MG tablet TAKE 1 TABLET BY MOUTH ONCE  DAILY FOR CHOLESTEROL   Calcium-Magnesium-Vitamin D (CALCIUM 1200+D3 PO) Take 1 tablet by mouth daily.   Cholecalciferol (DIALYVITE VITAMIN D 5000) 125 MCG (5000 UT) capsule Take 5,000 Units by mouth daily. (Patient not taking: Reported on 11/29/2022)   cyanocobalamin 1000 MCG tablet Take 1,000 mcg by mouth daily. Vit B12   dofetilide (TIKOSYN) 125 MCG capsule Take 1 capsule (125 mcg total) by mouth 2 (two) times daily.   DULoxetine (CYMBALTA) 30 MG capsule Take 1 capsule (30 mg total) by mouth daily.   famotidine (PEPCID) 20 MG tablet TAKE 1 TABLET BY MOUTH DAILY FOR HEARTBURN   FARXIGA 10 MG TABS tablet Take 10 mg by mouth every morning.   ferrous sulfate 325 (65 FE) MG tablet Take 650 mg by mouth See admin instructions. Every three weeks for one week   ibandronate (BONIVA) 150 MG tablet Take 1 tablet (150 mg total) by mouth every 30 (thirty) days. Take in the morning with a full glass of water, on an empty stomach, and do not take anything else by mouth or lie down for the next 30 min.   losartan (COZAAR) 100 MG tablet TAKE 1 TABLET BY MOUTH ONCE  DAILY FOR BLOOD PRESSURE   MAGnesium-Oxide 400 (240 Mg) MG tablet TAKE 1 TABLET(400 MG) BY MOUTH DAILY   traMADol (ULTRAM) 50 MG tablet Take 1 tablet (50 mg total) by mouth every 8 (eight) hours as needed for moderate pain.   triamcinolone cream (KENALOG) 0.1 % Apply 1 Application topically daily as needed. Up to 5 days per week. Avoid face, groin, underarms.   [DISCONTINUED] pregabalin (LYRICA) 75 MG capsule Take 1 capsule (75 mg total) by mouth 2 (two) times daily.   pregabalin (LYRICA) 100 MG capsule Take 1 capsule (100 mg total) by mouth 2 (two) times daily.   No facility-administered encounter medications on file as of 11/22/2022.

## 2022-11-22 ENCOUNTER — Encounter: Payer: Self-pay | Admitting: Family Medicine

## 2022-11-22 ENCOUNTER — Ambulatory Visit (INDEPENDENT_AMBULATORY_CARE_PROVIDER_SITE_OTHER): Payer: Medicare Other | Admitting: Family Medicine

## 2022-11-22 ENCOUNTER — Encounter: Payer: Self-pay | Admitting: *Deleted

## 2022-11-22 VITALS — BP 130/60 | HR 62 | Temp 97.9°F | Ht 63.0 in | Wt 188.1 lb

## 2022-11-22 DIAGNOSIS — M25511 Pain in right shoulder: Secondary | ICD-10-CM

## 2022-11-22 DIAGNOSIS — R200 Anesthesia of skin: Secondary | ICD-10-CM | POA: Diagnosis not present

## 2022-11-22 DIAGNOSIS — M75121 Complete rotator cuff tear or rupture of right shoulder, not specified as traumatic: Secondary | ICD-10-CM | POA: Diagnosis not present

## 2022-11-22 DIAGNOSIS — R202 Paresthesia of skin: Secondary | ICD-10-CM | POA: Diagnosis not present

## 2022-11-22 DIAGNOSIS — G8929 Other chronic pain: Secondary | ICD-10-CM | POA: Diagnosis not present

## 2022-11-22 MED ORDER — PREGABALIN 100 MG PO CAPS: 100.0000 mg | ORAL_CAPSULE | Freq: Two times a day (BID) | ORAL | 3 refills | Status: DC

## 2022-11-22 MED ORDER — TRAMADOL HCL 50 MG PO TABS: 50.0000 mg | ORAL_TABLET | Freq: Three times a day (TID) | ORAL | 0 refills | Status: DC | PRN

## 2022-11-26 NOTE — Progress Notes (Unsigned)
  Electrophysiology Office Follow up Visit Note:    Date:  11/29/2022   ID:  Amy Underwood, DOB 11-29-1945, MRN [id number]  PCP:  Pleas Koch, NP  [facility] Cardiologist:  None  [facility] Electrophysiologist:  Vickie Epley, MD    Interval History:    Amy Underwood is a 77 y.o. female who presents for a follow up visit.   She is doing well after her atrial fibrillation ablation on Jan 03, 2022 and watchman implant April 20, 2022. CT scan on June 22, 2022 showed complete endothelialization of the Watchman device with full seal of the left atrial appendage.  She saw [name] on October 27, 2022.  At that appointment with [name], the patient expressed a desire to stop her dofetilide.  She has done well since I last saw her.  She is interested in stopping her Tikosyn given her maintenance of normal rhythm after her ablation procedure.     Past medical, surgical, social and family history were reviewed.  ROS:   Please see the history of present illness.    All other systems reviewed and are negative.  EKGs/Labs/Other Studies Reviewed:    The following studies were reviewed today:  EKG:  The ekg ordered today demonstrates sinus rhythm.  Low voltage.   Physical Exam:    VS:  BP 120/82   Pulse 69   Ht 5\' 3"  (1.6 m)   Wt 185 lb (83.9 kg)   SpO2 98%   BMI 32.77 kg/m     Wt Readings from Last 3 Encounters:  11/29/22 185 lb (83.9 kg)  11/22/22 188 lb 2 oz (85.3 kg)  10/27/22 181 lb (82.1 kg)     GEN:  Well nourished, well developed in no acute distress CARDIAC: RRR, no murmurs, rubs, gallops RESPIRATORY:  Clear to auscultation without rales, wheezing or rhonchi       ASSESSMENT:    1. Persistent atrial fibrillation ([place])   2. Presence of Watchman left atrial appendage closure device   3. Essential hypertension   4. Encounter for therapeutic drug monitoring    PLAN:    In order of problems listed above:  #Persistent atrial  fibrillation #Watchman in situ Previously managed with dofetilide Underwent ablation in May 2023 without recurrence.  Has a watchman in situ  Discussed management of her atrial fibrillation in detail.  Discussed the pros and cons of stopping her Tikosyn today.  Given the results she has had following ablation, I think it is reasonable for her to stop the Tikosyn with careful monitoring of her heart rhythm.  We discussed rhythm monitoring strategies during today's appointment.  Her Watchman device is well-seated with an excellent seal of the left atrial appendage.  #Hypertension At goal today.  Recommend checking blood pressures 1-2 times per week at home and recording the values.  Recommend bringing these recordings to the primary care physician.    Follow-up with APP in 6 months.    Signed, Lars Mage, MD, [facility], [facility] 11/29/2022 10:48 AM    Electrophysiology [facility]

## 2022-11-29 ENCOUNTER — Ambulatory Visit: Payer: Medicare Other | Attending: Cardiology | Admitting: Cardiology

## 2022-11-29 ENCOUNTER — Encounter: Payer: Self-pay | Admitting: Cardiology

## 2022-11-29 VITALS — BP 120/82 | HR 69 | Ht 63.0 in | Wt 185.0 lb

## 2022-11-29 DIAGNOSIS — I10 Essential (primary) hypertension: Secondary | ICD-10-CM

## 2022-11-29 DIAGNOSIS — I4819 Other persistent atrial fibrillation: Secondary | ICD-10-CM

## 2022-11-29 DIAGNOSIS — Z95818 Presence of other cardiac implants and grafts: Secondary | ICD-10-CM

## 2022-11-29 DIAGNOSIS — Z5181 Encounter for therapeutic drug level monitoring: Secondary | ICD-10-CM | POA: Diagnosis not present

## 2022-11-29 NOTE — Patient Instructions (Signed)
Medication Instructions:  Your physician recommends that you continue on your current medications as directed. Please refer to the Current Medication list given to you today.  *If you need a refill on your cardiac medications before your next appointment, please call your pharmacy*  Follow-Up: At [facility], you and your health needs are our priority.  As part of our continuing mission to provide you with exceptional heart care, we have created designated Provider Care Teams.  These Care Teams include your primary Cardiologist (physician) and Advanced Practice Providers (APPs -  Physician Assistants and Nurse Practitioners) who all work together to provide you with the care you need, when you need it.  Your next appointment:   6 month(s)  Provider:   Mamie Levers, NP

## 2022-12-08 ENCOUNTER — Ambulatory Visit
Admission: RE | Admit: 2022-12-08 | Discharge: 2022-12-08 | Disposition: A | Discharge status: Home / Self Care | Payer: Medicare Other | Source: Ambulatory Visit | Attending: Family Medicine | Admitting: Family Medicine

## 2022-12-08 DIAGNOSIS — G8929 Other chronic pain: Secondary | ICD-10-CM

## 2022-12-12 ENCOUNTER — Other Ambulatory Visit: Payer: Self-pay | Admitting: Neurology

## 2022-12-12 MED ORDER — DULOXETINE HCL 30 MG PO CPEP: 30.0000 mg | ORAL_CAPSULE | Freq: Every day | ORAL | 3 refills | Status: DC

## 2022-12-12 NOTE — Addendum Note (Signed)
Addended by: Hannah Beat on: 12/12/2022 10:38 AM   Modules accepted: Orders

## 2022-12-12 NOTE — Addendum Note (Signed)
Addended by: Danne Harbor on: 12/12/2022 01:42 PM   Modules accepted: Orders

## 2022-12-14 ENCOUNTER — Encounter: Payer: Self-pay | Admitting: *Deleted

## 2022-12-19 ENCOUNTER — Telehealth: Payer: Self-pay | Admitting: Cardiology

## 2022-12-19 NOTE — Telephone Encounter (Signed)
   Pre-operative Risk Assessment    Patient Name: Amy Underwood  DOB: 27-Nov-1945 MRN: [id number]      Request for Surgical Clearance    Procedure:   Right reverse total shoulder replacement   Date of Surgery:  Clearance TBD                                 Surgeon:  Dr. Ave Filter  Surgeon's Group or Practice Name:  [facility]  Phone number:  [phone]  Fax number:  [phone]    Type of Clearance Requested:   - Medical  - Pharmacy:  Hold "unclear"      Type of Anesthesia:   choice    Additional requests/questions:    SignedForest Gleason   12/19/2022, 2:27 PM

## 2022-12-20 ENCOUNTER — Telehealth: Payer: Self-pay

## 2022-12-20 ENCOUNTER — Ambulatory Visit (INDEPENDENT_AMBULATORY_CARE_PROVIDER_SITE_OTHER): Payer: Medicare Other | Admitting: Neurology

## 2022-12-20 VITALS — BP 157/105 | HR 85 | Ht 63.0 in | Wt 174.0 lb

## 2022-12-20 DIAGNOSIS — R202 Paresthesia of skin: Secondary | ICD-10-CM | POA: Diagnosis not present

## 2022-12-20 DIAGNOSIS — G5603 Carpal tunnel syndrome, bilateral upper limbs: Secondary | ICD-10-CM

## 2022-12-20 MED ORDER — DULOXETINE HCL 60 MG PO CPEP: 60.0000 mg | ORAL_CAPSULE | Freq: Every day | ORAL | 3 refills | Status: DC

## 2022-12-20 NOTE — Telephone Encounter (Signed)
Received pre-op clearance form for patients right shoulder replacement through [facility].  In order to complete the form, patient needs to schedule pre-op exam appointment. Please call patient to schedule.

## 2022-12-20 NOTE — Telephone Encounter (Signed)
Patient scheduled on 4/23

## 2022-12-20 NOTE — Procedures (Signed)
Full Name: Amy Underwood Gender: Female MRN #: [id number] Date of Birth: [birth year]    Visit Date: 12/20/2022 07:58 Age: 77 Years Examining Physician: Dr. Levert Feinstein Referring Physician: Dr. Levert Feinstein Height: 5 feet 3 inch History: 77 year old female presenting with worsening bilateral hands paresthesia, right shoulder pain, pending right shoulder surgery   Summary of the test: Nerve conduction study:  Bilateral ulnar sensory and motor responses were normal.  Bilateral radial sensory responses were within normal limit.  Bilateral median sensory responses were absent.  Bilateral median motor responses showed mild to moderately prolonged distal latency, with mildly decreased CMAP amplitude,  Electromyography: Selected needle examinations of bilateral upper extremity muscles, and cervical paraspinal muscles were normal, with exception of slight decreased recruitment pattern at bilateral abductor pollicis brevis.  Conclusion: This is a mild abnormal study.  There is electrodiagnostic evidence of mild median neuropathy across the wrist, consistent with mild bilateral carpal tunnel syndromes.  There is no evidence of active cervical radiculopathy.    ------------------------------- Levert Feinstein, M.D.Ph.D.  [facility] [address] [place], [place] [address] Tel: [phone] Fax: [phone]  Verbal informed consent was obtained from the patient, patient was informed of potential risk of procedure, including bruising, bleeding, hematoma formation, infection, muscle weakness, muscle pain, numbness, among others.        MNC    Nerve / Sites Muscle Latency Ref. Amplitude Ref. Rel Amp Segments Distance Velocity Ref. Area    ms ms mV mV %  cm m/s m/s mVms  R Median - APB     Wrist APB 4.5 ?4.4 3.5 ?4.0 100 Wrist - APB 7   7.3     Upper arm APB 9.2  3.1  88.8 Upper arm - Wrist 19.6 41 ?49 7.1  L Median - APB     Wrist APB 4.2 ?4.4 2.8 ?4.0 100  Wrist - APB 7   8.5     Upper arm APB 8.6  2.3  79.8 Upper arm - Wrist 22.6 51 ?49 5.6  R Ulnar - ADM     Wrist ADM 2.8 ?3.3 5.8 ?6.0 100 Wrist - ADM 7   17.3     B.Elbow ADM 4.6  5.6  96.3 B.Elbow - Wrist 11 61 ?49 16.8     A.Elbow ADM 7.1  4.2  75.5 A.Elbow - B.Elbow 16 63 ?49 13.4  L Ulnar - ADM     Wrist ADM 3.0 ?3.3 5.6 ?6.0 100 Wrist - ADM 7   20.9     B.Elbow ADM 4.4  5.4  97.3 B.Elbow - Wrist 10 69 ?49 20.1     A.Elbow ADM 7.5  4.8  88 A.Elbow - B.Elbow 12.6 41 ?49 15.9             SNC    Nerve / Sites Rec. Site Peak Lat Ref.  Amp Ref. Segments Distance    ms ms V V  cm  L Radial - Anatomical snuff box (Forearm)     Forearm Wrist 2.6 ?2.9 12 ?15 Forearm - Wrist 10  R Radial - Anatomical snuff box (Forearm)     Forearm Wrist 2.6 ?2.9 15 ?15 Forearm - Wrist 10  R Median - Orthodromic (Dig II, Mid palm)     Dig II Wrist NR ?3.4 NR ?10 Dig II - Wrist 13  L Median - Orthodromic (Dig II, Mid palm)     Dig II Wrist NR ?3.4 NR ?10 Dig  II - Wrist 13  R Ulnar - Orthodromic, (Dig V, Mid palm)     Dig V Wrist 3.0 ?3.1 5 ?5 Dig V - Wrist 11  L Ulnar - Orthodromic, (Dig V, Mid palm)     Dig V Wrist 3.3 ?3.1 5 ?5 Dig V - Wrist 68                 F  Wave    Nerve F Lat Ref.   ms ms  R Ulnar - ADM 27.7 ?32.0  L Ulnar - ADM 28.5 ?32.0         EMG Summary Table    Spontaneous MUAP Recruitment  Muscle IA Fib PSW Fasc Other Amp Dur. Poly Pattern  R. First dorsal interosseous Normal None None None _______ Normal Normal Normal Normal  R. Abductor pollicis brevis Increased None None None _______ Increased Increased 1+ Reduced  R. Pronator teres Normal None None None _______ Normal Normal Normal Normal  R. Biceps brachii Normal None None None _______ Normal Normal Normal Normal  R. Deltoid Normal None None None _______ Normal Normal Normal Normal  R. Triceps brachii Normal None None None _______ Normal Normal Normal Normal  R. Brachioradialis Normal None None None _______ Normal Normal  Normal Normal  R. Cervical paraspinals Normal None None None _______ Normal Normal Normal Normal  L. First dorsal interosseous Normal None None None _______ Normal Normal Normal Normal  L. Abductor pollicis brevis Increased None None None _______ Increased Increased 1+ Reduced  L. Pronator teres Normal None None None _______ Normal Normal Normal Normal  L. Biceps brachii Normal None None None _______ Normal Normal Normal Normal  L. Deltoid Normal None None None _______ Normal Normal Normal Normal  L. Triceps brachii Normal None None None _______ Normal Normal Normal Normal  L. Cervical paraspinals Normal None None None _______ Normal Normal Normal Normal

## 2022-12-20 NOTE — Progress Notes (Signed)
Chief Complaint  Patient presents with   Procedure    Rm EMG/NCV 4.       ASSESSMENT AND PLAN  Amy Underwood is a 77 y.o. female   Bilateral carpal tunnel syndrome   EMG nerve conduction study confirmed only mild bilateral carpal tunnel syndromes, no evidence of axonal loss, demyelinating in nature,  Cymbalta 30 mg every morning was helpful, will increase to 60 mg, gabapentin 100 mg maximum up to 6 tablets in 1 day as needed  Bilateral wrist splint   DIAGNOSTIC DATA (LABS, IMAGING, TESTING) - I reviewed patient records, labs, notes, testing and imaging myself where available.   MEDICAL HISTORY:  Amy Underwood, is a 77 year old right-handed female, seen in request by primary care physician Dr. Hannah Beat, for evaluation of bilateral hands paresthesia, she is accompanied by her sister at today's visit on September 21, 2022   I reviewed and summarized the referring note. PMHX. HTN HLD Adhesive capsulitis of right shoulder. Watchman for Afib.  She lives alone, noticed gradual onset bilateral hands paresthesia around July 2023, getting worse, especially on her right dominant hand, failed fingertips numbness all the time, especially at nighttime, she woke up has to shake her hands to make the sensation comes back, sometimes to the point of fingertips painful,  She denies neck pain, no gait abnormality, no bowel bladder incontinence, no significant muscle weakness  UPDATE December 20 2022: She complains of worsening right shoulder pain, has pending right shoulder surgery, continued complaints of bilateral hands paresthesia, fingertips numbness tingling, wrist discomfort, Cymbalta 30 mg daily was helpful  She denies bilateral feet paresthesia  PHYSICAL EXAM:   Vitals:   12/20/22 0815  BP: (!) 157/105  Pulse: 85  Weight: 174 lb (78.9 kg)  Height:  (1.6 m)   Not recorded     Body mass index is 30.82 kg/m.  PHYSICAL EXAMNIATION:  Gen: NAD, conversant, well  nourised, well groomed                     Cardiovascular: Regular rate rhythm, no peripheral edema, warm, nontender. Eyes: Conjunctivae clear without exudates or hemorrhage Neck: Supple, no carotid bruits. Pulmonary: Clear to auscultation bilaterally   NEUROLOGICAL EXAM:  MENTAL STATUS: Speech/cognition: Awake, alert, oriented to history taking and casual conversation CRANIAL NERVES: CN II: Visual fields are full to confrontation. Pupils are round equal and briskly reactive to light. CN III, IV, VI: extraocular movement are normal. No ptosis. CN V: Facial sensation is intact to light touch CN VII: Face is symmetric with normal eye closure  CN VIII: Hearing is normal to causal conversation. CN IX, X: Phonation is normal. CN XI: Head turning and shoulder shrug are intact  MOTOR: Normal strength of bilateral abductor pollicis brevis, opponens, limited examination of right upper extremity due to right shoulder pain,  REFLEXES: Reflexes are 1 and symmetric at the biceps, triceps, knees, and ankles. Plantar responses are flexor.  SENSORY: Decreased to pinprick at fingerpads,  COORDINATION: There is no trunk or limb dysmetria noted.  GAIT/STANCE: Posture is normal. Gait is steady    REVIEW OF SYSTEMS:  Full 14 system review of systems performed and notable only for as above All other review of systems were negative.   ALLERGIES: Allergies  Allergen Reactions   Alendronate Palpitations   Cefaclor Hives and Rash    HOME MEDICATIONS: Current Outpatient Medications  Medication Sig Dispense Refill   albuterol (VENTOLIN HFA) 108 (90 Base) MCG/ACT inhaler Inhale 1-2  puffs into the lungs every 6 (six) hours as needed for wheezing or shortness of breath. 6.7 g 0   allopurinol (ZYLOPRIM) 100 MG tablet Take 100 mg by mouth at bedtime.     amLODipine (NORVASC) 10 MG tablet Take 1 tablet (10 mg total) by mouth daily. 180 tablet 3   Apremilast (OTEZLA) 10 & 20 & 30 MG TBPK Take 10 mg x  1 week, then 20 mg x 1 week, then 30 mg x 3 weeks     atorvastatin (LIPITOR) 20 MG tablet TAKE 1 TABLET BY MOUTH ONCE  DAILY FOR CHOLESTEROL 90 tablet 2   Calcium-Magnesium-Vitamin D (CALCIUM 1200+D3 PO) Take 1 tablet by mouth daily.     Cholecalciferol (DIALYVITE VITAMIN D 5000) 125 MCG (5000 UT) capsule Take 5,000 Units by mouth daily. (Patient not taking: Reported on 11/29/2022)     cyanocobalamin 1000 MCG tablet Take 1,000 mcg by mouth daily. Vit B12     dofetilide (TIKOSYN) 125 MCG capsule Take 1 capsule (125 mcg total) by mouth 2 (two) times daily. 180 capsule 3   DULoxetine (CYMBALTA) 30 MG capsule Take 1 capsule (30 mg total) by mouth daily. 30 capsule 3   famotidine (PEPCID) 20 MG tablet TAKE 1 TABLET BY MOUTH DAILY FOR HEARTBURN 90 tablet 1   FARXIGA 10 MG TABS tablet Take 10 mg by mouth every morning.     ferrous sulfate 325 (65 FE) MG tablet Take 650 mg by mouth See admin instructions. Every three weeks for one week     ibandronate (BONIVA) 150 MG tablet Take 1 tablet (150 mg total) by mouth every 30 (thirty) days. Take in the morning with a full glass of water, on an empty stomach, and do not take anything else by mouth or lie down for the next 30 min. 3 tablet 3   losartan (COZAAR) 100 MG tablet TAKE 1 TABLET BY MOUTH ONCE  DAILY FOR BLOOD PRESSURE 90 tablet 2   MAGnesium-Oxide 400 (240 Mg) MG tablet TAKE 1 TABLET(400 MG) BY MOUTH DAILY 90 tablet 3   pregabalin (LYRICA) 100 MG capsule Take 1 capsule (100 mg total) by mouth 2 (two) times daily. 60 capsule 3   traMADol (ULTRAM) 50 MG tablet Take 1 tablet (50 mg total) by mouth every 8 (eight) hours as needed for moderate pain. 20 tablet 0   triamcinolone cream (KENALOG) 0.1 % Apply 1 Application topically daily as needed. Up to 5 days per week. Avoid face, groin, underarms. 80 g 3   No current facility-administered medications for this visit.    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 Lalla Brothers   Psoriasis     PAST SURGICAL HISTORY: Past Surgical History:  Procedure Laterality Date   ATRIAL FIBRILLATION ABLATION N/A 01/03/2022   Procedure: ATRIAL FIBRILLATION ABLATION;  Surgeon: Lanier Prude, MD;  Location: [facility] INVASIVE CV [facility];  Service: Cardiovascular;  Laterality: N/A;   CESAREAN SECTION     3   CHOLECYSTECTOMY     HIP ARTHROPLASTY Left    KNEE ARTHROPLASTY Bilateral    LEFT ATRIAL APPENDAGE OCCLUSION N/A 04/20/2022   Procedure: LEFT ATRIAL  APPENDAGE OCCLUSION;  Surgeon: Lanier Prude, MD;  Location: [facility] INVASIVE CV LAB;  Service: Cardiovascular;  Laterality: N/A;   TEE WITHOUT CARDIOVERSION N/A 04/20/2022   Procedure: TRANSESOPHAGEAL ECHOCARDIOGRAM (TEE);  Surgeon: Lanier Prude, MD;  Location[facility] INVASIVE CV LAB;  Service: Cardiovascular;  Laterality: N/A;    FAMILY HISTORY: Family History  Problem Relation Age of Onset   Heart disease Mother    Breast cancer Neg Hx     SOCIAL HISTORY: Social History   Socioeconomic History   Marital status: Widowed    Spouse name: Not on file   Number of children: Not on file   Years of education: Not on file   Highest education level: Not on file  Occupational History   Not on file  Tobacco Use   Smoking status: Former    Types: Cigarettes    Start date: 2001   Smokeless tobacco: Never  Vaping Use   Vaping Use: Never used  Substance and Sexual Activity   Alcohol use: Not on file   Drug use: Never   Sexual activity: Not Currently  Other Topics Concern   Not on file  Social History Narrative   Not on file   Social Determinants of Health   Financial Resource Strain: Low Risk  (04/03/2022)   Overall Financial Resource Strain  (CARDIA)    Difficulty of Paying Living Expenses: Not hard at all  Food Insecurity: No Food Insecurity (04/03/2022)   Hunger Vital Sign    Worried About Running Out of Food in the Last Year: Never true    Ran Out of Food in the Last Year: Never true  Transportation Needs: No Transportation Needs (04/03/2022)   PRAPARE - Administrator, Civil Service (Medical): No    Lack of Transportation (Non-Medical): No  Physical Activity: Insufficiently Active (04/03/2022)   Exercise Vital Sign    Days of Exercise per Week: 4 days    Minutes of Exercise per Session: 30 min  Stress: No Stress Concern Present (04/03/2022)   [employer] of Occupational Health - Occupational Stress Questionnaire    Feeling of Stress : Not at all  Social Connections: Socially Isolated (04/03/2022)   Social Connection and Isolation Panel [NHANES]    Frequency of Communication with Friends and Family: Three times a week    Frequency of Social Gatherings with Friends and Family: Three times a week    Attends Religious Services: Never    Active Member of Clubs or Organizations: No    Attends Banker Meetings: Never    Marital Status: Widowed  Intimate Partner Violence: Not At Risk (04/03/2022)   Humiliation, Afraid, Rape, and Kick questionnaire    Fear of Current or Ex-Partner: No    Emotionally Abused: No    Physically Abused: No    Sexually Abused: No      Levert Feinstein, M.D. Ph.D.  [facility] [address] [place], [place] [address] Ph: [phone] Fax: [phone]  CC:  [name], NP [address] [place],  [place] [address]  [name], NP

## 2022-12-26 ENCOUNTER — Ambulatory Visit (INDEPENDENT_AMBULATORY_CARE_PROVIDER_SITE_OTHER): Payer: Medicare Other | Admitting: Primary Care

## 2022-12-26 ENCOUNTER — Encounter: Payer: Self-pay | Admitting: Primary Care

## 2022-12-26 VITALS — BP 136/86 | HR 70 | Temp 97.8°F | Ht 63.0 in | Wt 191.0 lb

## 2022-12-26 DIAGNOSIS — M19012 Primary osteoarthritis, left shoulder: Secondary | ICD-10-CM

## 2022-12-26 DIAGNOSIS — Z01818 Encounter for other preprocedural examination: Secondary | ICD-10-CM | POA: Diagnosis not present

## 2022-12-26 DIAGNOSIS — G5603 Carpal tunnel syndrome, bilateral upper limbs: Secondary | ICD-10-CM

## 2022-12-26 DIAGNOSIS — J4520 Mild intermittent asthma, uncomplicated: Secondary | ICD-10-CM

## 2022-12-26 DIAGNOSIS — L409 Psoriasis, unspecified: Secondary | ICD-10-CM

## 2022-12-26 DIAGNOSIS — I10 Essential (primary) hypertension: Secondary | ICD-10-CM

## 2022-12-26 DIAGNOSIS — I480 Paroxysmal atrial fibrillation: Secondary | ICD-10-CM

## 2022-12-26 DIAGNOSIS — N184 Chronic kidney disease, stage 4 (severe): Secondary | ICD-10-CM

## 2022-12-26 DIAGNOSIS — K219 Gastro-esophageal reflux disease without esophagitis: Secondary | ICD-10-CM

## 2022-12-26 DIAGNOSIS — M19011 Primary osteoarthritis, right shoulder: Secondary | ICD-10-CM

## 2022-12-26 DIAGNOSIS — M1A9XX0 Chronic gout, unspecified, without tophus (tophi): Secondary | ICD-10-CM

## 2022-12-26 NOTE — Assessment & Plan Note (Signed)
Controlled.  No concerns today. Continue albuterol PRN.

## 2022-12-26 NOTE — Assessment & Plan Note (Signed)
Controlled. ? ?Continue famotidine 20 mg daily.  ?

## 2022-12-26 NOTE — Assessment & Plan Note (Signed)
Pending right total reverse shoulder replacement per Dr. Ave Filter.  Reviewed nephrology notes and labs from April 2024, cardiology notes from March 2024. Overall I believe she will do well with surgery.  Cleared.   Will fax over forms.

## 2022-12-26 NOTE — Assessment & Plan Note (Signed)
Pending right reverse total shoulder arthroplasty per Dr. Ave Filter.  Reviewed cardiology notes from March 2024, nephrology notes and labs from April 2024, neurology notes from April 2024.  Cleared for surgery per medical standpoint.  Will fax over forms.

## 2022-12-26 NOTE — Assessment & Plan Note (Signed)
Following with nephrology. Office notes and labs reviewed from April 2024.  Continue BP control, losartan 100 mg, Farxiga 10 mg daily.

## 2022-12-26 NOTE — Assessment & Plan Note (Addendum)
Controlled.  Continue losartan 100 mg daily, amlodipine 10 mg daily, Farxiga 10 mg daily. Reviewed labs from nephrology [facility] from April 2024.

## 2022-12-26 NOTE — Assessment & Plan Note (Addendum)
Following with neurology, office notes reviewed from April 2024 Continue gabapentin.

## 2022-12-26 NOTE — Patient Instructions (Signed)
It was a pleasure to see you today!   

## 2022-12-26 NOTE — Assessment & Plan Note (Signed)
Controlled.  Continue Otezla per dermatology.

## 2022-12-26 NOTE — Assessment & Plan Note (Signed)
Controlled. Reviewed uric acid level from October 2023. Continue allopurinol 100 mg HS.

## 2022-12-26 NOTE — Assessment & Plan Note (Signed)
Irregular today. Following with cardiology, office notes reviewed from March 2024

## 2022-12-26 NOTE — Progress Notes (Signed)
Subjective:    Patient ID: Amy Underwood, female    DOB: [birth year], 77 y.o.   MRN: [id number]  HPI  Amy Underwood is a very pleasant 77 y.o. female with a history of hypertension, atrial fibrillation, asthma, bilateral carpal tunnel syndrome, CKD, watchman device, osteoarthritis who presents today for surgical clearance.   Her son joins [place] today.  She is pending right revers total shoulder replacement per Dr. Ave Filter. Evaluated by cardiology in March 2024 and her nephrologist and neurologist last week.   BP Readings from Last 3 Encounters:  12/26/22 136/86  12/20/22 (!) 157/105  11/29/22 120/82      Review of Systems  Constitutional:  Negative for unexpected weight change.  HENT:  Negative for rhinorrhea.   Respiratory:  Negative for cough and shortness of breath.   Cardiovascular:  Negative for chest pain.  Gastrointestinal:  Negative for constipation and diarrhea.  Genitourinary:  Negative for difficulty urinating.  Musculoskeletal:  Positive for arthralgias.  Skin:  Negative for rash.  Allergic/Immunologic: Negative for environmental allergies.  Neurological:  Positive for numbness. Negative for dizziness and headaches.  Psychiatric/Behavioral:  The patient is not nervous/anxious.          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 Lalla Brothers   Psoriasis     Social History   Socioeconomic History   Marital status: Widowed    Spouse name: Not on file   Number of children: Not on file   Years of education: Not  on file   Highest education level: 12th grade  Occupational History   Not on file  Tobacco Use   Smoking status: Former    Types: Cigarettes    Start date: 2001   Smokeless tobacco: Never  Vaping Use   Vaping Use: Never used  Substance and Sexual Activity   Alcohol use: Not on file   Drug use: Never   Sexual activity: Not Currently  Other Topics Concern   Not on file  Social History Narrative   Not on file   Social Determinants of Health   Financial Resource Strain: Low Risk  (12/22/2022)   Overall Financial Resource Strain (CARDIA)    Difficulty of Paying Living Expenses: Not hard at all  Food Insecurity: No Food Insecurity (12/22/2022)   Hunger Vital Sign    Worried About Running Out of Food in the Last Year: Never true    Ran Out of Food in the Last Year: Never true  Transportation Needs: No Transportation Needs (12/22/2022)   PRAPARE - Administrator, Civil Service (Medical): No    Lack of Transportation (Non-Medical): No  Physical Activity: Insufficiently Active (12/22/2022)   Exercise Vital Sign    Days of Exercise per Week: 3 days    Minutes of Exercise per Session: 10 min  Stress: Stress Concern Present (12/22/2022)   [employer] of Occupational Health - Occupational Stress Questionnaire    Feeling of Stress : To some extent  Social Connections: Socially Isolated (12/22/2022)  Social Connection and Isolation Panel [NHANES]    Frequency of Communication with Friends and Family: More than three times a week    Frequency of Social Gatherings with Friends and Family: Three times a week    Attends Religious Services: Never    Active Member of Clubs or Organizations: No    Attends Banker Meetings: Never    Marital Status: Widowed  Intimate Partner Violence: Not At Risk (04/03/2022)   Humiliation, Afraid, Rape, and Kick questionnaire    Fear of Current or Ex-Partner: No    Emotionally Abused: No    Physically Abused: No    Sexually  Abused: No    Past Surgical History:  Procedure Laterality Date   ATRIAL FIBRILLATION ABLATION N/A 01/03/2022   Procedure: ATRIAL FIBRILLATION ABLATION;  Surgeon: Lanier Prude, MD;  Location: [facility] INVASIVE CV [facility];  Service: Cardiovascular;  Laterality: N/A;   CESAREAN SECTION     3   CHOLECYSTECTOMY     HIP ARTHROPLASTY Left    KNEE ARTHROPLASTY Bilateral    LEFT ATRIAL APPENDAGE OCCLUSION N/A 04/20/2022   Procedure: LEFT ATRIAL APPENDAGE OCCLUSION;  Surgeon: Lanier Prude, MD;  Location: [facility] INVASIVE CV LAB;  Service: Cardiovascular;  Laterality: N/A;   TEE WITHOUT CARDIOVERSION N/A 04/20/2022   Procedure: TRANSESOPHAGEAL ECHOCARDIOGRAM (TEE);  Surgeon: Lanier Prude, MD;  Location: [facility] INVASIVE CV LAB;  Service: Cardiovascular;  Laterality: N/A;    Family History  Problem Relation Age of Onset   Heart disease Mother    Breast cancer Neg Hx     Allergies  Allergen Reactions   Alendronate Palpitations   Cefaclor Hives and Rash    Current Outpatient Medications on File Prior to Visit  Medication Sig Dispense Refill   albuterol (VENTOLIN HFA) 108 (90 Base) MCG/ACT inhaler Inhale 1-2 puffs into the lungs every 6 (six) hours as needed for wheezing or shortness of breath. 6.7 g 0   allopurinol (ZYLOPRIM) 100 MG tablet Take 100 mg by mouth at bedtime.     amLODipine (NORVASC) 10 MG tablet Take 1 tablet (10 mg total) by mouth daily. 180 tablet 3   Apremilast (OTEZLA) 10 & 20 & 30 MG TBPK Take 10 mg x 1 week, then 20 mg x 1 week, then 30 mg x 3 weeks     atorvastatin (LIPITOR) 20 MG tablet TAKE 1 TABLET BY MOUTH ONCE  DAILY FOR CHOLESTEROL 90 tablet 2   Calcium-Magnesium-Vitamin D (CALCIUM 1200+D3 PO) Take 1 tablet by mouth daily.     cyanocobalamin 1000 MCG tablet Take 1,000 mcg by mouth daily. Vit B12     DULoxetine (CYMBALTA) 60 MG capsule Take 1 capsule (60 mg total) by mouth daily. 90 capsule 3   famotidine (PEPCID) 20 MG tablet TAKE 1 TABLET BY MOUTH DAILY FOR HEARTBURN  90 tablet 1   FARXIGA 10 MG TABS tablet Take 10 mg by mouth every morning.     ferrous sulfate 325 (65 FE) MG tablet Take 650 mg by mouth See admin instructions. Every three weeks for one week     ibandronate (BONIVA) 150 MG tablet Take 1 tablet (150 mg total) by mouth every 30 (thirty) days. Take in the morning with a full glass of water, on an empty stomach, and do not take anything else by mouth or lie down for the next 30 min. 3 tablet 3   losartan (COZAAR) 100 MG tablet TAKE 1 TABLET BY MOUTH ONCE  DAILY FOR BLOOD PRESSURE 90 tablet  2   MAGnesium-Oxide 400 (240 Mg) MG tablet TAKE 1 TABLET(400 MG) BY MOUTH DAILY 90 tablet 3   triamcinolone cream (KENALOG) 0.1 % Apply 1 Application topically daily as needed. Up to 5 days per week. Avoid face, groin, underarms. 80 g 3   Cholecalciferol (DIALYVITE VITAMIN D 5000) 125 MCG (5000 UT) capsule Take 5,000 Units by mouth daily. (Patient not taking: Reported on 11/29/2022)     No current facility-administered medications on file prior to visit.    BP 136/86   Pulse 70   Temp 97.8 F (36.6 C) (Temporal)   Ht  (1.6 m)   Wt 191 lb (86.6 kg)   SpO2 98%   BMI 33.83 kg/m  Objective:   Physical Exam HENT:     Right Ear: Tympanic membrane and ear canal normal.     Left Ear: Tympanic membrane and ear canal normal.     Nose: Nose normal.  Eyes:     Conjunctiva/sclera: Conjunctivae normal.     Pupils: Pupils are equal, round, and reactive to light.  Neck:     Thyroid: No thyromegaly.  Cardiovascular:     Rate and Rhythm: Normal rate. Rhythm irregular.     Heart sounds: No murmur heard. Pulmonary:     Effort: Pulmonary effort is normal.     Breath sounds: Normal breath sounds. No rales.  Abdominal:     General: Bowel sounds are normal.     Palpations: Abdomen is soft.     Tenderness: There is no abdominal tenderness.  Musculoskeletal:     Right shoulder: Decreased range of motion. Decreased strength.     Cervical back: Neck supple.   Lymphadenopathy:     Cervical: No cervical adenopathy.  Skin:    General: Skin is warm and dry.     Findings: No rash.  Neurological:     Mental Status: She is alert and oriented to person, place, and time.     Cranial Nerves: No cranial nerve deficit.     Deep Tendon Reflexes: Reflexes are normal and symmetric.  Psychiatric:        Mood and Affect: Mood normal.           Assessment & Plan:  Essential hypertension Assessment & Plan: Controlled.  Continue losartan 100 mg daily, amlodipine 10 mg daily, Farxiga 10 mg daily. Reviewed labs from nephrology through [facility] from April 2024.   Paroxysmal atrial fibrillation Assessment & Plan: Irregular today. Following with cardiology, office notes reviewed from March 2024   Mild intermittent asthma without complication Assessment & Plan: Controlled.  No concerns today. Continue albuterol PRN.   Gastroesophageal reflux disease without esophagitis Assessment & Plan: Controlled.  Continue famotidine 20 mg daily.   Bilateral carpal tunnel syndrome Assessment & Plan: Following with neurology, office notes reviewed from April 2024 Continue gabapentin.   Arthritis of both glenohumeral joints Assessment & Plan: Pending right total reverse shoulder replacement per Dr. Ave Filter.  Reviewed nephrology notes and labs from April 2024, cardiology notes from March 2024. Overall I believe she will do well with surgery.  Cleared.   Will fax over forms.   Psoriasis Assessment & Plan: Controlled.  Continue Otezla per dermatology.   Chronic kidney disease, stage IV (severe) Assessment & Plan: Following with nephrology. Office notes and labs reviewed from April 2024.  Continue BP control, losartan 100 mg, Farxiga 10 mg daily.   Chronic gout without tophus, unspecified cause, unspecified site Assessment & Plan: Controlled. Reviewed uric acid level  from October 2023. Continue allopurinol 100 mg  HS.   Preoperative clearance Assessment & Plan: Pending right reverse total shoulder arthroplasty per Dr. Ave Filter.  Reviewed cardiology notes from March 2024, nephrology notes and labs from April 2024, neurology notes from April 2024.  Cleared for surgery per medical standpoint.  Will fax over forms.          [name], NP

## 2022-12-26 NOTE — Telephone Encounter (Signed)
I will fax notes to surgeon's office to surgery scheduler Darel Hong, to please see notes from Eligha Bridegroom, NP  Swinyer, Zachary George, NP  [facility] Preop Callback2 hours ago (8:10 AM)   Was sent to Dr. Lalla Brothers for clearance but he never responded. It looks like pt has been cleared by PCP. There are no cardiac meds to hold. Will you please verify with Dr. Veda Canning office that they do not need anything else from [place].    Please let our office know if you are still needing anything else from [facility].

## 2022-12-27 DIAGNOSIS — M1A9XX0 Chronic gout, unspecified, without tophus (tophi): Secondary | ICD-10-CM

## 2022-12-27 NOTE — Telephone Encounter (Signed)
[facility] called and said that they still need cardiac clearance from Dr. Lalla Brothers. PCP cleared them, but they still need cardiac clearance from [place]

## 2022-12-28 ENCOUNTER — Ambulatory Visit (INDEPENDENT_AMBULATORY_CARE_PROVIDER_SITE_OTHER): Payer: Medicare Other | Admitting: Dermatology

## 2022-12-28 VITALS — BP 140/97 | HR 118

## 2022-12-28 DIAGNOSIS — Z7189 Other specified counseling: Secondary | ICD-10-CM | POA: Diagnosis not present

## 2022-12-28 DIAGNOSIS — L409 Psoriasis, unspecified: Secondary | ICD-10-CM | POA: Diagnosis not present

## 2022-12-28 DIAGNOSIS — Z79899 Other long term (current) drug therapy: Secondary | ICD-10-CM

## 2022-12-28 MED ORDER — COLCHICINE 0.6 MG PO TABS: ORAL_TABLET | ORAL | 0 refills | Status: DC

## 2022-12-28 MED ORDER — OTEZLA 30 MG PO TABS: 30.0000 mg | ORAL_TABLET | Freq: Two times a day (BID) | ORAL | 5 refills | Status: DC

## 2022-12-28 NOTE — Patient Instructions (Addendum)
[address] - [place], [place] - [address] [place] P: [phone] F: [phone] Address  [address]  [address]  [place] [address]      Due to recent changes in healthcare laws, you may see results of your pathology and/or laboratory studies on MyChart before the doctors have had a chance to review them. We understand that in some cases there may be results that are confusing or concerning to you. Please understand that not all results are received at the same time and often the doctors may need to interpret multiple results in order to provide you with the best plan of care or course of treatment. Therefore, we ask that you please give [place] 2 business days to thoroughly review all your results before contacting the office for clarification. Should we see a critical lab result, you will be contacted sooner.   If You Need Anything After Your Visit  If you have any questions or concerns for your doctor, please call our main line at [phone] and press option 4 to reach your doctor's medical assistant. If no one answers, please leave a voicemail as directed and we will return your call as soon as possible. Messages left after 4 pm will be answered the following business day.   You may also send [place] a message via MyChart. We typically respond to MyChart messages within 1-2 business days.  For prescription refills, please ask your pharmacy to contact our office. Our fax number is [phone].  If you have an urgent issue when the clinic is closed that cannot wait until the next business day, you can page your doctor at the number below.    Please note that while we do our best to be available for urgent issues outside of office hours, we are not available 24/7.   If you have an urgent issue and are unable to reach [place], you may choose to seek medical care at your doctor's office, retail clinic, urgent care center, or emergency room.  If you have a medical emergency, please  immediately call 911 or go to the emergency department.  Pager Numbers  - Dr. Gwen Pounds: [phone]  - Dr. Neale Burly: [phone]  - Dr. Roseanne Reno: [phone]  In the event of inclement weather, please call our main line at [phone] for an update on the status of any delays or closures.  Dermatology Medication Tips: Please keep the boxes that topical medications come in in order to help keep track of the instructions about where and how to use these. Pharmacies typically print the medication instructions only on the boxes and not directly on the medication tubes.   If your medication is too expensive, please contact our office at [phone] option 4 or send [place] a message through MyChart.   We are unable to tell what your co-pay for medications will be in advance as this is different depending on your insurance coverage. However, we may be able to find a substitute medication at lower cost or fill out paperwork to get insurance to cover a needed medication.   If a prior authorization is required to get your medication covered by your insurance company, please allow [place] 1-2 business days to complete this process.  Drug prices often vary depending on where the prescription is filled and some pharmacies may offer cheaper prices.  The website www.goodrx.com contains coupons for medications through different pharmacies. The prices here do not account for what the cost may be with help from insurance (  it may be cheaper with your insurance), but the website can give you the price if you did not use any insurance.  - You can print the associated coupon and take it with your prescription to the pharmacy.  - You may also stop by our office during regular business hours and pick up a GoodRx coupon card.  - If you need your prescription sent electronically to a different pharmacy, notify our office through [place] MyChart or by phone at [phone] option 4.     Si Usted Necesita Algo Despus  de Su Visita  Tambin puede enviarnos un mensaje a travs de MyChart. Por lo general respondemos a los mensajes de MyChart en el transcurso de 1 a 2 das hbiles.  Para renovar recetas, por favor pida a su farmacia que se ponga en contacto con nuestra oficina. Nuestro nmero de fax es el [phone].  Si tiene un asunto urgente cuando la clnica est cerrada y que no puede esperar hasta el siguiente da hbil, puede llamar/localizar a su doctor(a) al nmero que aparece a continuacin.   Por favor, tenga en cuenta que aunque hacemos todo lo posible para estar disponibles para asuntos urgentes fuera del horario de oficina, no estamos disponibles las 24 horas del da, los 7 das de la semana.   Si tiene un problema urgente y no puede comunicarse con nosotros, puede optar por buscar atencin mdica  en el consultorio de su doctor(a), en una clnica privada, en un centro de atencin urgente o en una sala de emergencias.  Si tiene una emergencia mdica, por favor llame inmediatamente al 911 o vaya a la sala de emergencias.  Nmeros de bper  - Dr. Kowalski: [phone]  - Dra. Moye: [phone]  - Dra. Stewart: [phone]  En caso de inclemencias del tiempo, por favor llame a nuestra lnea principal al [phone] para una actualizacin sobre el estado de cualquier retraso o cierre.  Consejos para la medicacin en dermatologa: Por favor, guarde las cajas en las que vienen los medicamentos de uso tpico para ayudarle a seguir las instrucciones sobre dnde y cmo usarlos. Las farmacias generalmente imprimen las instrucciones del medicamento slo en las cajas y no directamente en los tubos del medicamento.   Si su medicamento es muy caro, por favor, pngase en contacto con nuestra oficina llamando al [phone] y presione la opcin 4 o envenos un mensaje a travs de MyChart.   No podemos decirle cul ser su copago por los medicamentos por adelantado ya que esto es diferente dependiendo  de la cobertura de su seguro. Sin embargo, es posible que podamos encontrar un medicamento sustituto a menor costo o llenar un formulario para que el seguro cubra el medicamento que se considera necesario.   Si se requiere una autorizacin previa para que su compaa de seguros cubra su medicamento, por favor permtanos de 1 a 2 das hbiles para completar este proceso.  Los precios de los medicamentos varan con frecuencia dependiendo del lugar de dnde se surte la receta y alguna farmacias pueden ofrecer precios ms baratos.  El sitio web www.goodrx.com tiene cupones para medicamentos de diferentes farmacias. Los precios aqu no tienen en cuenta lo que podra costar con la ayuda del seguro (puede ser ms barato con su seguro), pero el sitio web puede darle el precio si no utiliz ningn seguro.  - Puede imprimir el cupn correspondiente y llevarlo con su receta a la farmacia.  - Tambin puede pasar por nuestra oficina durante el horario de atencin   regular y recoger una tarjeta de cupones de GoodRx.  - Si necesita que su receta se enve electrnicamente a una farmacia diferente, informe a nuestra oficina a travs de MyChart de [place] o por telfono llamando al [phone] y presione la opcin 4.  

## 2022-12-28 NOTE — Progress Notes (Signed)
   Follow-Up Visit   Subjective  Amy Underwood is a 77 y.o. female who presents for the following: Psoriasis Cleared completely with Otezla samples, but she had to reschedule her appointment due to having the flu. She has been out of medication for about 3 weeks and has started to flare again. She tolerated Otezla 30 mg po QD well with no s/e.   The following portions of the chart were reviewed this encounter and updated as appropriate: medications, allergies, medical history  Review of Systems:  No other skin or systemic complaints except as noted in HPI or Assessment and Plan.  Objective  Well appearing patient in no apparent distress; mood and affect are within normal limits.  Areas Examined: The face, hands, arms, elbows  Relevant exam findings are noted in the Assessment and Plan.      Assessment & Plan     PSORIASIS Well-demarcated erythematous papules/plaques with silvery scale, guttate pink scaly papules. 15% BSA.  Chronic and persistent condition with duration or expected duration over one year. Condition is bothersome/symptomatic for patient. Currently flared.  Pt does have joint pain with a hx of gout.  Treatment Plan:  Start sample of Otezla  Titrate from 10 mg to 20 to 30 mg daily. Discussed potential side effects in detail. (2 sample packs given) Side effects of Otezla (apremilast) include diarrhea, nausea, headache, upper respiratory infection, depression, and weight decrease (5-10%). It should only be taken by pregnant women after a discussion regarding risks and benefits with their doctor. Goal is control of skin condition, not cure.  The use of [place] requires long term medication management, including periodic office visits.   Counseling on psoriasis and coordination of care  psoriasis is a chronic non-curable, but treatable genetic/hereditary disease that may have other systemic features affecting other organ systems such as joints (Psoriatic Arthritis). It  is associated with an increased risk of inflammatory bowel disease, heart disease, non-alcoholic fatty liver disease, and depression.  Treatments include light and laser treatments; topical medications; and systemic medications including oral and injectables.  Return in about 3 months (around 03/29/2023) for psoriasis/Otezla follow up .  Maylene Roes, CMA, am acting as scribe for Armida Sans, MD .  Documentation: I have reviewed the above documentation for accuracy and completeness, and I agree with the above.  Armida Sans, MD

## 2022-12-29 ENCOUNTER — Other Ambulatory Visit: Payer: Self-pay | Admitting: Orthopedic Surgery

## 2023-01-01 NOTE — Patient Instructions (Signed)
SURGICAL WAITING ROOM VISITATION  Patients having surgery or a procedure may have no more than 2 support people in the waiting area - these visitors may rotate.    Children under the age of 48 must have an adult with them who is not the patient.  Due to an increase in RSV and influenza rates and associated hospitalizations, children ages 108 and under may not visit patients in [facility] hospitals.  If the patient needs to stay at the hospital during part of their recovery, the visitor guidelines for inpatient rooms apply. Pre-op nurse will coordinate an appropriate time for 1 support person to accompany patient in pre-op.  This support person may not rotate.    Please refer to the [facility] website for the visitor guidelines for Inpatients (after your surgery is over and you are in a regular room).       Your procedure is scheduled on: 01/18/23   Report to [facility] Main Entrance    Report to admitting at  5:15 AM   Call this number if you have problems the morning of surgery [phone]   Do not eat food :After Midnight.   After Midnight you may have the following liquids until 4:30 AM DAY OF SURGERY  Water Non-Citrus Juices (without pulp, NO RED-Apple, White grape, White cranberry) Black Coffee (NO MILK/CREAM OR CREAMERS, sugar ok)  Clear Tea (NO MILK/CREAM OR CREAMERS, sugar ok) regular and decaf                             Plain Jell-O (NO RED)                                           Fruit ices (not with fruit pulp, NO RED)                                     Popsicles (NO RED)                                                               Sports drinks like Gatorade (NO RED)                 The day of surgery:  Drink ONE (1) Pre-Surgery Clear Ensure  at 4:30 AM the morning of surgery. Drink in one sitting. Do not sip.  This drink was given to you during your hospital  pre-op appointment visit. Nothing else to drink after completing the  Pre-Surgery Clear  Ensure          If you have questions, please contact your surgeon's office.   FOLLOW BOWEL PREP AND ANY ADDITIONAL PRE OP INSTRUCTIONS YOU RECEIVED FROM YOUR SURGEON'S OFFICE!!!     Oral Hygiene is also important to reduce your risk of infection.                                    Remember - BRUSH YOUR TEETH THE MORNING OF SURGERY WITH YOUR  REGULAR TOOTHPASTE  DENTURES WILL BE REMOVED PRIOR TO SURGERY PLEASE DO NOT APPLY "Poly grip" OR ADHESIVES!!!   Do NOT smoke after Midnight   Take these medicines the morning of surgery with A SIP OF WATER:   Allopurinol  Amlodipine  Atorvastatin  Colchicine  Duloxetine  famotidine  DO NOT TAKE ANY ORAL DIABETIC MEDICATIONS DAY OF YOUR SURGERY  Bring CPAP mask and tubing day of surgery.                              You may not have any metal on your body including hair pins, jewelry, and body piercing             Do not wear make-up, lotions, powders, perfumes, or deodorant  Do not wear nail polish including gel and S&S, artificial/acrylic nails, or any other type of covering on natural nails including finger and toenails. If you have artificial nails, gel coating, etc. that needs to be removed by a nail salon please have this removed prior to surgery or surgery may need to be canceled/ delayed if the surgeon/ anesthesia feels like they are unable to be safely monitored.   Do not shave  48 hours prior to surgery.   Do not bring valuables to the hospital.  IS NOT             RESPONSIBLE   FOR VALUABLES.   Contacts, glasses, dentures or bridgework may not be worn into surgery.     DO NOT BRING YOUR HOME MEDICATIONS TO THE HOSPITAL. PHARMACY WILL DISPENSE MEDICATIONS LISTED ON YOUR MEDICATION LIST TO YOU DURING YOUR ADMISSION IN THE HOSPITAL!    Patients discharged on the day of surgery will not be allowed to drive home.  Someone NEEDS to stay with you for the first 24 hours after anesthesia.   Special Instructions: Bring a  copy of your healthcare power of attorney and living will documents the day of surgery if you haven't scanned them before.              Please read over the following fact sheets you were given: IF YOU HAVE QUESTIONS ABOUT YOUR PRE-OP INSTRUCTIONS PLEASE CALL [phone]   If you received a COVID test during your pre-op visit  it is requested that you wear a mask when out in public, stay away from anyone that may not be feeling well and notify your surgeon if you develop symptoms. If you test positive for Covid or have been in contact with anyone that has tested positive in the last 10 days please notify you surgeon.

## 2023-01-01 NOTE — Progress Notes (Addendum)
COVID Vaccine Completed:  Yes  Date of COVID positive in last 90 days:  PCP - Vernona Rieger, NP Cardiologist - Steffanie Dunn, MD Electrophysiologist  - Steffanie Dunn, MD Nephrologist - Lamont Dowdy, MD  Chest x-ray - 01-04-23 Epic EKG - 11-29-22 Epic Stress Test - yes in past ECHO - 04-20-22 Epic  Cardiac Cath -  Pacemaker/ICD device last checked: Spinal Cord Stimulator: Cardiac CT - 06-22-22 Epic Afib alation - 01-03-22 Epic  Bowel Prep - N/A  Sleep Study - N/A CPAP -   Fasting Blood Sugar - N/A Checks Blood Sugar _____ times a day  Last dose of GLP1 agonist-  N/A GLP1 instructions:  N/A   Farxiga for kidneys Last dose of SGLT-2 inhibitors-  N/A SGLT-2 instructions: Hold 3 days before surgery    Blood Thinner Instructions: N/A Aspirin Instructions: Last Dose:  Activity level:  Unable to go up a flight of stairs due to knee pain.  Pt. Lives alone and is able to perform ADL's without symptoms.   Anesthesia review:  Afib, HTN, CKD, murmur.  Watchman implant  Patient denies shortness of breath, fever, cough and chest pain at PAT appointment  Patient verbalized understanding of instructions that were given to them at the PAT appointment. Patient was also instructed that they will need to review over the PAT instructions again at home before surgery.

## 2023-01-01 NOTE — Patient Instructions (Addendum)
SURGICAL WAITING ROOM VISITATION  Patients having surgery or a procedure may have no more than 2 support people in the waiting area - these visitors may rotate.    Children under the age of 73 must have an adult with them who is not the patient.  Due to an increase in RSV and influenza rates and associated hospitalizations, children ages 28 and under may not visit patients in [facility] hospitals.  If the patient needs to stay at the hospital during part of their recovery, the visitor guidelines for inpatient rooms apply. Pre-op nurse will coordinate an appropriate time for 1 support person to accompany patient in pre-op.  This support person may not rotate.    Please refer to the [facility] website for the visitor guidelines for Inpatients (after your surgery is over and you are in a regular room).       Your procedure is scheduled on: 01/18/23   Report to [facility] Main Entrance    Report to admitting at 5:15 AM   Call this number if you have problems the morning of surgery [phone]   Do not eat food :After Midnight.   After Midnight you may have the following liquids until 4:30 AM DAY OF SURGERY  Water Non-Citrus Juices (without pulp, NO RED-Apple, White grape, White cranberry) Black Coffee (NO MILK/CREAM OR CREAMERS, sugar ok)  Clear Tea (NO MILK/CREAM OR CREAMERS, sugar ok) regular and decaf                             Plain Jell-O (NO RED)                                           Fruit ices (not with fruit pulp, NO RED)                                     Popsicles (NO RED)                                                               Sports drinks like Gatorade (NO RED)             .       The day of surgery:  Drink ONE (1) Pre-Surgery Clear Ensure at 4:30 AM the morning of surgery. Drink in one sitting. Do not sip.  This drink was given to you during your hospital  pre-op appointment visit. Nothing else to drink after completing the Pre-Surgery Clear  Ensure .          If you have questions, please contact your surgeon's office.   FOLLOW  ANY ADDITIONAL PRE OP INSTRUCTIONS YOU RECEIVED FROM YOUR SURGEON'S OFFICE!!!     Oral Hygiene is also important to reduce your risk of infection.                                    Remember - BRUSH YOUR TEETH THE MORNING OF SURGERY WITH YOUR REGULAR  TOOTHPASTE  DENTURES WILL BE REMOVED PRIOR TO SURGERY PLEASE DO NOT APPLY "Poly grip" OR ADHESIVES!!!   Do NOT smoke after Midnight   Take these medicines the morning of surgery with A SIP OF WATER:   Allopurinol  Amlodipine  Atorvastatin  Colchicine  Duloxetine  Famotidine  Okay to use inhalers   Hold Farxiga 3 days before surgery (do not take after 01-14-23)  Bring CPAP mask and tubing day of surgery.                              You may not have any metal on your body including hair pins, jewelry, and body piercing             Do not wear make-up, lotions, powders, perfumes or deodorant  Do not wear nail polish including gel and S&S, artificial/acrylic nails, or any other type of covering on natural nails including finger and toenails. If you have artificial nails, gel coating, etc. that needs to be removed by a nail salon please have this removed prior to surgery or surgery may need to be canceled/ delayed if the surgeon/ anesthesia feels like they are unable to be safely monitored.   Do not shave  48 hours prior to surgery.          .   Do not bring valuables to the hospital. [facility] IS NOT RESPONSIBLE   FOR VALUABLES.   Contacts, glasses, dentures or bridgework may not be worn into surgery.    DO NOT BRING YOUR HOME MEDICATIONS TO THE HOSPITAL. PHARMACY WILL DISPENSE MEDICATIONS LISTED ON YOUR MEDICATION LIST TO YOU DURING YOUR ADMISSION IN THE HOSPITAL!    Patients discharged on the day of surgery will not be allowed to drive home.  Someone NEEDS to stay with you for the first 24 hours after anesthesia.   Special Instructions:  Bring a copy of your healthcare power of attorney and living will documents the day of surgery if you haven't scanned them before.              Please read over the following fact sheets you were given: IF YOU HAVE QUESTIONS ABOUT YOUR PRE-OP INSTRUCTIONS PLEASE CALL [phone] Gwen    If you received a COVID test during your pre-op visit  it is requested that you wear a mask when out in public, stay away from anyone that may not be feeling well and notify your surgeon if you develop symptoms. If you test positive for Covid or have been in contact with anyone that has tested positive in the last 10 days please notify you surgeon.  [facility]- Preparing for Total Shoulder Arthroplasty    Before surgery, you can play an important role. Because skin is not sterile, your skin needs to be as free of germs as possible. You can reduce the number of germs on your skin by using the following products. Benzoyl Peroxide Gel Reduces the number of germs present on the skin Applied twice a day to shoulder area starting two days before surgery    ==================================================================  Please follow these instructions carefully:  BENZOYL PEROXIDE 5% GEL  Please do not use if you have an allergy to benzoyl peroxide.   If your skin becomes reddened/irritated stop using the benzoyl peroxide.  Starting two days before surgery, apply as follows: Apply benzoyl peroxide in the morning and at night. Apply after taking a shower. If you are not  taking a shower clean entire shoulder front, back, and side along with the armpit with a clean wet washcloth.  Place a quarter-sized dollop on your shoulder and rub in thoroughly, making sure to cover the front, back, and side of your shoulder, along with the armpit.   2 days before ____ AM   ____ PM              1 day before ____ AM   ____ PM                         Do this twice a day for two days.  (Last application is the night before  surgery, AFTER using the CHG soap as described below).  Do NOT apply benzoyl peroxide gel on the day of surgery.    Incentive Spirometer  An incentive spirometer is a tool that can help keep your lungs clear and active. This tool measures how well you are filling your lungs with each breath. Taking long deep breaths may help reverse or decrease the chance of developing breathing (pulmonary) problems (especially infection) following: A long period of time when you are unable to move or be active. BEFORE THE PROCEDURE  If the spirometer includes an indicator to show your best effort, your nurse or respiratory therapist will set it to a desired goal. If possible, sit up straight or lean slightly forward. Try not to slouch. Hold the incentive spirometer in an upright position. INSTRUCTIONS FOR USE  Sit on the edge of your bed if possible, or sit up as far as you can in bed or on a chair. Hold the incentive spirometer in an upright position. Breathe out normally. Place the mouthpiece in your mouth and seal your lips tightly around it. Breathe in slowly and as deeply as possible, raising the piston or the ball toward the top of the column. Hold your breath for 3-5 seconds or for as long as possible. Allow the piston or ball to fall to the bottom of the column. Remove the mouthpiece from your mouth and breathe out normally. Rest for a few seconds and repeat Steps 1 through 7 at least 10 times every 1-2 hours when you are awake. Take your time and take a few normal breaths between deep breaths. The spirometer may include an indicator to show your best effort. Use the indicator as a goal to work toward during each repetition. After each set of 10 deep breaths, practice coughing to be sure your lungs are clear. If you have an incision (the cut made at the time of surgery), support your incision when coughing by placing a pillow or rolled up towels firmly against it. Once you are able to get out of  bed, walk around indoors and cough well. You may stop using the incentive spirometer when instructed by your caregiver.  RISKS AND COMPLICATIONS Take your time so you do not get dizzy or light-headed. If you are in pain, you may need to take or ask for pain medication before doing incentive spirometry. It is harder to take a deep breath if you are having pain. AFTER USE Rest and breathe slowly and easily. It can be helpful to keep track of a log of your progress. Your caregiver can provide you with a simple table to help with this. If you are using the spirometer at home, follow these instructions: SEEK MEDICAL CARE IF:  You are having difficultly using the spirometer. You have trouble using  the spirometer as often as instructed. Your pain medication is not giving enough relief while using the spirometer. You develop fever of 100.5 F (38.1 C) or higher. SEEK IMMEDIATE MEDICAL CARE IF:  You cough up bloody sputum that had not been present before. You develop fever of 102 F (38.9 C) or greater. You develop worsening pain at or near the incision site. MAKE SURE YOU:  Understand these instructions. Will watch your condition. Will get help right away if you are not doing well or get worse. Document Released: 01/01/2007 Document Revised: 11/13/2011 Document Reviewed: 03/04/2007 [facility] Patient Information 2014 [place], [place].   ________________________________________________________________________

## 2023-01-02 ENCOUNTER — Telehealth: Payer: Self-pay | Admitting: Primary Care

## 2023-01-02 NOTE — Telephone Encounter (Signed)
Called patient and she states this request was sent by mistake. Okay to decline.

## 2023-01-02 NOTE — Telephone Encounter (Addendum)
Received refill request for iron pills.  I have never prescribed these for her previously, it looks like this was done by historical provider?  I do not see recent iron level test on file.  She may want to contact the person who originally prescribed her iron for guidance.     From: Amy Underwood To: Office of [name], NP Sent: 01/02/2023  3:16 PM EDT Subject: Medication Renewal Request  Refills have been requested for the following medications:      ferrous sulfate 325 (65 FE) MG tablet  Preferred pharmacy: [facility] Delivery method: Daryll Drown

## 2023-01-02 NOTE — Telephone Encounter (Signed)
Noted  

## 2023-01-03 NOTE — Telephone Encounter (Signed)
Dr. Lalla Brothers, you recently saw this pt in clinic on 11/29/2022. Please advise on surgical clearance (see clearance notes attached). Please route your response to P CV DIV PREOP. Thank you.

## 2023-01-04 ENCOUNTER — Encounter (HOSPITAL_COMMUNITY): Payer: Self-pay

## 2023-01-04 ENCOUNTER — Ambulatory Visit (HOSPITAL_COMMUNITY)
Admission: RE | Admit: 2023-01-04 | Discharge: 2023-01-04 | Disposition: A | Discharge status: Home / Self Care | Payer: Medicare Other | Source: Ambulatory Visit | Attending: Orthopedic Surgery | Admitting: Orthopedic Surgery

## 2023-01-04 ENCOUNTER — Encounter (HOSPITAL_COMMUNITY)
Admission: RE | Admit: 2023-01-04 | Discharge: 2023-01-04 | Disposition: A | Discharge status: Home / Self Care | Payer: Medicare Other | Source: Ambulatory Visit | Attending: Orthopedic Surgery | Admitting: Orthopedic Surgery

## 2023-01-04 ENCOUNTER — Other Ambulatory Visit: Payer: Self-pay

## 2023-01-04 VITALS — BP 157/91 | HR 93 | Temp 98.6°F | Resp 20 | Ht 63.0 in | Wt 180.0 lb

## 2023-01-04 DIAGNOSIS — Z95811 Presence of heart assist device: Secondary | ICD-10-CM | POA: Diagnosis not present

## 2023-01-04 DIAGNOSIS — N184 Chronic kidney disease, stage 4 (severe): Secondary | ICD-10-CM

## 2023-01-04 DIAGNOSIS — D510 Vitamin B12 deficiency anemia due to intrinsic factor deficiency: Secondary | ICD-10-CM | POA: Diagnosis not present

## 2023-01-04 DIAGNOSIS — I129 Hypertensive chronic kidney disease with stage 1 through stage 4 chronic kidney disease, or unspecified chronic kidney disease: Secondary | ICD-10-CM | POA: Diagnosis not present

## 2023-01-04 DIAGNOSIS — D631 Anemia in chronic kidney disease: Secondary | ICD-10-CM

## 2023-01-04 DIAGNOSIS — Z87891 Personal history of nicotine dependence: Secondary | ICD-10-CM | POA: Insufficient documentation

## 2023-01-04 DIAGNOSIS — Z01818 Encounter for other preprocedural examination: Secondary | ICD-10-CM | POA: Diagnosis present

## 2023-01-04 DIAGNOSIS — D649 Anemia, unspecified: Secondary | ICD-10-CM

## 2023-01-04 DIAGNOSIS — M75101 Unspecified rotator cuff tear or rupture of right shoulder, not specified as traumatic: Secondary | ICD-10-CM | POA: Diagnosis not present

## 2023-01-04 DIAGNOSIS — I10 Essential (primary) hypertension: Secondary | ICD-10-CM

## 2023-01-04 LAB — CBC
HCT: 39.2 % (ref 36.0–46.0)
Hemoglobin: 12 g/dL (ref 12.0–15.0)
MCH: 28.6 pg (ref 26.0–34.0)
MCHC: 30.6 g/dL (ref 30.0–36.0)
MCV: 93.3 fL (ref 80.0–100.0)
Platelets: 168 10*3/uL (ref 150–400)
RBC: 4.2 MIL/uL (ref 3.87–5.11)
RDW: 13.4 % (ref 11.5–15.5)
WBC: 4.6 10*3/uL (ref 4.0–10.5)
nRBC: 0 % (ref 0.0–0.2)

## 2023-01-04 LAB — BASIC METABOLIC PANEL
Anion gap: 11 (ref 5–15)
BUN: 23 mg/dL (ref 8–23)
CO2: 25 mmol/L (ref 22–32)
Calcium: 9.4 mg/dL (ref 8.9–10.3)
Chloride: 104 mmol/L (ref 98–111)
Creatinine, Ser: 1.26 mg/dL — ABNORMAL HIGH (ref 0.44–1.00)
GFR, Estimated: 45 mL/min — ABNORMAL LOW (ref >60)
Glucose, Bld: 83 mg/dL (ref 70–99)
Potassium: 4.4 mmol/L (ref 3.5–5.1)
Sodium: 140 mmol/L (ref 135–145)

## 2023-01-04 LAB — SURGICAL PCR SCREEN
MRSA, PCR: NEGATIVE
Staphylococcus aureus: NEGATIVE

## 2023-01-04 NOTE — Telephone Encounter (Signed)
     Primary Cardiologist: None  Chart reviewed as part of pre-operative protocol coverage. Given past medical history and time since last visit, based on ACC/AHA guidelines, Amy Underwood would be at acceptable risk for the planned procedure without further cardiovascular testing.   Patient was advised that if she develops new symptoms prior to surgery to contact our office to arrange a follow-up appointment.  He verbalized understanding.  Her RCRI is a class I risk, 0.4% risk of major cardiac event.  I will route this recommendation to the requesting party via Epic fax function and remove from pre-op pool.  Please call with questions.  Thomasene Ripple. Kellis Mcadam NP-C     01/04/2023, 3:42 PM [facility] [address] Office [phone] Fax [phone]

## 2023-01-05 ENCOUNTER — Encounter: Payer: Self-pay | Admitting: Dermatology

## 2023-01-09 NOTE — Anesthesia Preprocedure Evaluation (Addendum)
Anesthesia Evaluation  Patient identified by MRN, date of birth, ID band Patient awake    Reviewed: Allergy & Precautions, NPO status , Patient's Chart, lab work & pertinent test results  History of Anesthesia Complications Negative for: history of anesthetic complications  Airway Mallampati: II  TM Distance: >3 FB Neck ROM: Full    Dental  (+) Missing,    Pulmonary asthma , former smoker   Pulmonary exam normal        Cardiovascular hypertension, Pt. on medications Normal cardiovascular exam+ dysrhythmias (S/P Watchman) Atrial Fibrillation      Neuro/Psych    GI/Hepatic Neg liver ROS,GERD  Medicated,,  Endo/Other  negative endocrine ROS    Renal/GU Renal InsufficiencyRenal disease (Cr 1.26)     Musculoskeletal  (+) Arthritis ,    Abdominal   Peds  Hematology negative hematology ROS (+)   Anesthesia Other Findings Day of surgery medications reviewed with patient.  Reproductive/Obstetrics                              Anesthesia Physical Anesthesia Plan  ASA: 2  Anesthesia Plan: General   Post-op Pain Management: Regional block* and Tylenol PO (pre-op)*   Induction: Intravenous  PONV Risk Score and Plan: 3 and Midazolam, Treatment may vary due to age or medical condition, Dexamethasone and Ondansetron  Airway Management Planned: Oral ETT  Additional Equipment: None  Intra-op Plan:   Post-operative Plan: Extubation in OR  Informed Consent: I have reviewed the patients History and Physical, chart, labs and discussed the procedure including the risks, benefits and alternatives for the proposed anesthesia with the patient or authorized representative who has indicated his/her understanding and acceptance.     Dental advisory given  Plan Discussed with: CRNA  Anesthesia Plan Comments: (See PAT note 01/04/2023)        Anesthesia Quick Evaluation

## 2023-01-09 NOTE — Progress Notes (Signed)
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 Jones Broom.  Per cardiology preoperative evaluation 01/04/2023, "Chart reviewed as part of pre-operative protocol coverage. Given past medical history and time since last visit, based on ACC/AHA guidelines, Amy Underwood would be at acceptable risk for the planned procedure without further cardiovascular testing.  Patient was advised that if she develops new symptoms prior to surgery to contact our office to arrange a follow-up appointment.  He verbalized understanding. Her RCRI is a class I risk, 0.4% risk of major cardiac event."  Seen by PCP 12/26/2022.  Per office visit note, "Cleared for surgery per medical standpoint."  Patient was seen by nephrology4/18/2024.  Stable at this visit with 5-month follow-up recommended.   12/14/2022 creatinine 1.14 06/21/2022 creatinine 1.46 10/05/2021 creatinine 1.26 VS: BP (!) 157/91   Pulse 93   Temp 37 C (Oral)   Resp 20   Ht 5\' 3"  (1.6 m)   Wt 81.6 kg   SpO2 100%   BMI 31.89 kg/m   PROVIDERS: [name], NP is PCP  Cardiologist - Steffanie Dunn, MD  Electrophysiologist  - Steffanie Dunn, MD  Nephrologist - Lamont Dowdy, MD LABS: Labs reviewed: Acceptable for surgery. (all labs ordered are listed, but only abnormal results are displayed)  Labs Reviewed  BASIC METABOLIC PANEL - Abnormal; Notable for the following components:      Result Value    Creatinine, Ser 1.26 (*)    GFR, Estimated 45 (*)    All other components within normal limits  SURGICAL PCR SCREEN  CBC     IMAGES:   EKG:   CV: Echo 04/20/2022 1. TEE Guided LAA closure. 31 mm Watchman FLX deployed. 25% compression.  No device leak. 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 Small  pericardial effusion that did not expand in size was present before and  after the procedure.   2. Left ventricular ejection fraction, by estimation, is 60 to 65%. The  left ventricle has normal function. Left ventricular endocardial border  not optimally defined to evaluate regional wall motion.   3. Right ventricular systolic function is normal. The right ventricular  size is normal.   4. Left atrial size was mildly dilated. No left atrial/left atrial  appendage thrombus was detected.   5. A small pericardial effusion is present. The pericardial effusion is  circumferential. There is no evidence of cardiac tamponade.   6. The mitral valve is grossly normal. Trivial mitral valve  regurgitation.   7. The aortic valve is tricuspid. There is moderate calcification of the  aortic valve. Aortic valve regurgitation is trivial. Aortic valve  sclerosis/calcification is present, without any evidence of aortic  stenosis.   8. There is Moderate (Grade III) plaque.  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 Lalla Brothers   Psoriasis     Past Surgical History:  Procedure Laterality Date   ATRIAL FIBRILLATION ABLATION N/A 01/03/2022    Procedure: ATRIAL FIBRILLATION ABLATION;  Surgeon: Lanier Prude, MD;  Location: [facility] INVASIVE CV LAB;  Service: Cardiovascular;  Laterality: N/A;   CESAREAN SECTION     3   CHOLECYSTECTOMY     HIP ARTHROPLASTY Left    KNEE ARTHROPLASTY Bilateral    LEFT ATRIAL APPENDAGE OCCLUSION N/A 04/20/2022   Procedure: LEFT ATRIAL APPENDAGE OCCLUSION;  Surgeon: Lanier Prude, MD;  Location: [facility] INVASIVE CV LAB;  Service: Cardiovascular;  Laterality: N/A;   TEE WITHOUT CARDIOVERSION N/A 04/20/2022   Procedure: TRANSESOPHAGEAL ECHOCARDIOGRAM (TEE);  Surgeon: Lanier Prude, MD;  Location[facility] INVASIVE CV LAB;  Service: Cardiovascular;  Laterality: N/A;    MEDICATIONS:  albuterol (VENTOLIN HFA) 108 (90 Base) MCG/ACT inhaler   allopurinol (ZYLOPRIM) 100 MG tablet   amLODipine (NORVASC) 10 MG tablet   Apremilast (OTEZLA) 30 MG TABS   atorvastatin (LIPITOR) 20 MG tablet   Calcium-Magnesium-Vitamin D (CALCIUM 1200+D3 PO)   Cholecalciferol (DIALYVITE VITAMIN D 5000) 125 MCG (5000 UT) capsule   colchicine 0.6 MG tablet   cyanocobalamin 1000 MCG tablet   DULoxetine (CYMBALTA) 60 MG capsule   famotidine (PEPCID) 20 MG tablet   FARXIGA 10 MG TABS tablet   ferrous sulfate 325 (65 FE) MG tablet   ibandronate (BONIVA) 150 MG tablet   losartan (COZAAR) 100 MG tablet   MAGnesium-Oxide 400 (240 Mg) MG tablet   triamcinolone cream (KENALOG) 0.1 %   No current facility-administered medications for this encounter.   Jodell Cipro Ward, PA-C [facility] Pre-Surgical Testing [phone]

## 2023-01-17 ENCOUNTER — Other Ambulatory Visit: Payer: Self-pay

## 2023-01-17 MED ORDER — OTEZLA 30 MG PO TABS: 30.0000 mg | ORAL_TABLET | Freq: Two times a day (BID) | ORAL | 3 refills | Status: DC

## 2023-01-17 NOTE — Progress Notes (Signed)
RX sent to [employer] Patient assistance. aw

## 2023-01-18 ENCOUNTER — Other Ambulatory Visit: Payer: Self-pay

## 2023-01-18 ENCOUNTER — Encounter (HOSPITAL_COMMUNITY): Payer: Self-pay | Admitting: Orthopedic Surgery

## 2023-01-18 ENCOUNTER — Ambulatory Visit (HOSPITAL_COMMUNITY)
Admission: RE | Admit: 2023-01-18 | Discharge: 2023-01-18 | Disposition: A | Discharge status: Home / Self Care | Payer: Medicare Other | Source: Ambulatory Visit | Attending: Orthopedic Surgery | Admitting: Orthopedic Surgery

## 2023-01-18 ENCOUNTER — Encounter (HOSPITAL_COMMUNITY)
Admission: RE | Disposition: A | Discharge status: Home / Self Care | Payer: Self-pay | Source: Ambulatory Visit | Attending: Orthopedic Surgery

## 2023-01-18 ENCOUNTER — Ambulatory Visit (HOSPITAL_COMMUNITY): Payer: Medicare Other | Admitting: Physician Assistant

## 2023-01-18 ENCOUNTER — Ambulatory Visit (HOSPITAL_BASED_OUTPATIENT_CLINIC_OR_DEPARTMENT_OTHER): Payer: Medicare Other | Admitting: Anesthesiology

## 2023-01-18 DIAGNOSIS — K219 Gastro-esophageal reflux disease without esophagitis: Secondary | ICD-10-CM | POA: Insufficient documentation

## 2023-01-18 DIAGNOSIS — I129 Hypertensive chronic kidney disease with stage 1 through stage 4 chronic kidney disease, or unspecified chronic kidney disease: Secondary | ICD-10-CM | POA: Insufficient documentation

## 2023-01-18 DIAGNOSIS — Z79899 Other long term (current) drug therapy: Secondary | ICD-10-CM | POA: Diagnosis not present

## 2023-01-18 DIAGNOSIS — J45909 Unspecified asthma, uncomplicated: Secondary | ICD-10-CM | POA: Insufficient documentation

## 2023-01-18 DIAGNOSIS — M19011 Primary osteoarthritis, right shoulder: Secondary | ICD-10-CM | POA: Diagnosis not present

## 2023-01-18 DIAGNOSIS — M75101 Unspecified rotator cuff tear or rupture of right shoulder, not specified as traumatic: Secondary | ICD-10-CM | POA: Insufficient documentation

## 2023-01-18 DIAGNOSIS — I10 Essential (primary) hypertension: Secondary | ICD-10-CM

## 2023-01-18 DIAGNOSIS — N189 Chronic kidney disease, unspecified: Secondary | ICD-10-CM | POA: Diagnosis not present

## 2023-01-18 DIAGNOSIS — Z95818 Presence of other cardiac implants and grafts: Secondary | ICD-10-CM | POA: Insufficient documentation

## 2023-01-18 DIAGNOSIS — I480 Paroxysmal atrial fibrillation: Secondary | ICD-10-CM | POA: Diagnosis not present

## 2023-01-18 DIAGNOSIS — Z87891 Personal history of nicotine dependence: Secondary | ICD-10-CM | POA: Insufficient documentation

## 2023-01-18 DIAGNOSIS — I2510 Atherosclerotic heart disease of native coronary artery without angina pectoris: Secondary | ICD-10-CM

## 2023-01-18 DIAGNOSIS — N184 Chronic kidney disease, stage 4 (severe): Secondary | ICD-10-CM

## 2023-01-18 DIAGNOSIS — Z01818 Encounter for other preprocedural examination: Secondary | ICD-10-CM

## 2023-01-18 DIAGNOSIS — G5601 Carpal tunnel syndrome, right upper limb: Secondary | ICD-10-CM | POA: Insufficient documentation

## 2023-01-18 DIAGNOSIS — D631 Anemia in chronic kidney disease: Secondary | ICD-10-CM

## 2023-01-18 HISTORY — PX: REVERSE SHOULDER ARTHROPLASTY: SHX5054

## 2023-01-18 HISTORY — PX: CARPAL TUNNEL RELEASE: SHX101

## 2023-01-18 SURGERY — ARTHROPLASTY, SHOULDER, TOTAL, REVERSE
Anesthesia: General | Site: Shoulder | Laterality: Right

## 2023-01-18 MED ORDER — VANCOMYCIN HCL IN DEXTROSE 1-5 GM/200ML-% IV SOLN
1000.0000 mg | INTRAVENOUS | Status: AC
  Administered 2023-01-18: 1000 mg via INTRAVENOUS
  Filled 2023-01-18: qty 200

## 2023-01-18 MED ORDER — PHENYLEPHRINE 80 MCG/ML (10ML) SYRINGE FOR IV PUSH (FOR BLOOD PRESSURE SUPPORT)
PREFILLED_SYRINGE | INTRAVENOUS | Status: AC
  Filled 2023-01-18: qty 10

## 2023-01-18 MED ORDER — FENTANYL CITRATE PF 50 MCG/ML IJ SOSY: 25.0000 ug | PREFILLED_SYRINGE | INTRAMUSCULAR | Status: DC | PRN

## 2023-01-18 MED ORDER — SODIUM CHLORIDE 0.9 % IR SOLN
Status: DC | PRN
  Administered 2023-01-18: 1000 mL

## 2023-01-18 MED ORDER — TRANEXAMIC ACID-NACL 1000-0.7 MG/100ML-% IV SOLN
1000.0000 mg | INTRAVENOUS | Status: AC
  Administered 2023-01-18: 1000 mg via INTRAVENOUS
  Filled 2023-01-18: qty 100

## 2023-01-18 MED ORDER — TIZANIDINE HCL 4 MG PO TABS: 2.0000 mg | ORAL_TABLET | Freq: Every day | ORAL | 0 refills | Status: DC

## 2023-01-18 MED ORDER — CHLORHEXIDINE GLUCONATE 0.12 % MT SOLN
15.0000 mL | Freq: Once | OROMUCOSAL | Status: AC
  Administered 2023-01-18: 15 mL via OROMUCOSAL

## 2023-01-18 MED ORDER — ROCURONIUM BROMIDE 10 MG/ML (PF) SYRINGE
PREFILLED_SYRINGE | INTRAVENOUS | Status: AC
  Filled 2023-01-18: qty 30

## 2023-01-18 MED ORDER — FENTANYL CITRATE (PF) 100 MCG/2ML IJ SOLN
INTRAMUSCULAR | Status: DC | PRN
  Administered 2023-01-18 (×2): 50 ug via INTRAVENOUS

## 2023-01-18 MED ORDER — LACTATED RINGERS IV SOLN: INTRAVENOUS | Status: DC

## 2023-01-18 MED ORDER — DEXAMETHASONE SODIUM PHOSPHATE 10 MG/ML IJ SOLN
INTRAMUSCULAR | Status: AC
  Filled 2023-01-18: qty 3

## 2023-01-18 MED ORDER — FENTANYL CITRATE (PF) 100 MCG/2ML IJ SOLN
INTRAMUSCULAR | Status: AC
  Filled 2023-01-18: qty 2

## 2023-01-18 MED ORDER — DEXAMETHASONE SODIUM PHOSPHATE 10 MG/ML IJ SOLN
INTRAMUSCULAR | Status: DC | PRN
  Administered 2023-01-18: 4 mg via INTRAVENOUS

## 2023-01-18 MED ORDER — MIDAZOLAM HCL 2 MG/2ML IJ SOLN
INTRAMUSCULAR | Status: AC
  Filled 2023-01-18: qty 2

## 2023-01-18 MED ORDER — ORAL CARE MOUTH RINSE: 15.0000 mL | Freq: Once | OROMUCOSAL | Status: AC

## 2023-01-18 MED ORDER — PROPOFOL 10 MG/ML IV BOLUS
INTRAVENOUS | Status: AC
  Filled 2023-01-18: qty 20

## 2023-01-18 MED ORDER — PHENYLEPHRINE 80 MCG/ML (10ML) SYRINGE FOR IV PUSH (FOR BLOOD PRESSURE SUPPORT)
PREFILLED_SYRINGE | INTRAVENOUS | Status: DC | PRN
  Administered 2023-01-18: 160 ug via INTRAVENOUS
  Administered 2023-01-18 (×2): 80 ug via INTRAVENOUS
  Administered 2023-01-18 (×2): 120 ug via INTRAVENOUS
  Administered 2023-01-18: 80 ug via INTRAVENOUS

## 2023-01-18 MED ORDER — BUPIVACAINE LIPOSOME 1.3 % IJ SUSP
INTRAMUSCULAR | Status: DC | PRN
  Administered 2023-01-18: 10 mL via PERINEURAL

## 2023-01-18 MED ORDER — EPHEDRINE 5 MG/ML INJ
INTRAVENOUS | Status: AC
  Filled 2023-01-18: qty 5

## 2023-01-18 MED ORDER — PROPOFOL 10 MG/ML IV BOLUS
INTRAVENOUS | Status: DC | PRN
  Administered 2023-01-18: 120 mg via INTRAVENOUS

## 2023-01-18 MED ORDER — PHENYLEPHRINE HCL-NACL 20-0.9 MG/250ML-% IV SOLN
INTRAVENOUS | Status: DC | PRN
  Administered 2023-01-18: 55 ug/min via INTRAVENOUS

## 2023-01-18 MED ORDER — LIDOCAINE HCL (PF) 2 % IJ SOLN
INTRAMUSCULAR | Status: AC
  Filled 2023-01-18: qty 15

## 2023-01-18 MED ORDER — OXYCODONE HCL 5 MG PO TABS: 5.0000 mg | ORAL_TABLET | ORAL | 0 refills | Status: DC | PRN

## 2023-01-18 MED ORDER — LEVOFLOXACIN IN D5W 500 MG/100ML IV SOLN
500.0000 mg | INTRAVENOUS | Status: AC
  Administered 2023-01-18: 500 mg via INTRAVENOUS
  Filled 2023-01-18: qty 100

## 2023-01-18 MED ORDER — 0.9 % SODIUM CHLORIDE (POUR BTL) OPTIME
TOPICAL | Status: DC | PRN
  Administered 2023-01-18: 1000 mL

## 2023-01-18 MED ORDER — ACETAMINOPHEN 500 MG PO TABS
1000.0000 mg | ORAL_TABLET | Freq: Once | ORAL | Status: AC
  Administered 2023-01-18: 1000 mg via ORAL
  Filled 2023-01-18: qty 2

## 2023-01-18 MED ORDER — METHOCARBAMOL 500 MG PO TABS: 500.0000 mg | ORAL_TABLET | Freq: Four times a day (QID) | ORAL | Status: DC | PRN

## 2023-01-18 MED ORDER — BUPIVACAINE-EPINEPHRINE (PF) 0.5% -1:200000 IJ SOLN
INTRAMUSCULAR | Status: DC | PRN
  Administered 2023-01-18: 15 mL via PERINEURAL

## 2023-01-18 MED ORDER — ONDANSETRON HCL 4 MG/2ML IJ SOLN
INTRAMUSCULAR | Status: AC
  Filled 2023-01-18: qty 6

## 2023-01-18 MED ORDER — MIDAZOLAM HCL 5 MG/5ML IJ SOLN
INTRAMUSCULAR | Status: DC | PRN
  Administered 2023-01-18: 1 mg via INTRAVENOUS

## 2023-01-18 MED ORDER — SUGAMMADEX SODIUM 200 MG/2ML IV SOLN
INTRAVENOUS | Status: DC | PRN
  Administered 2023-01-18: 200 mg via INTRAVENOUS

## 2023-01-18 MED ORDER — ONDANSETRON HCL 4 MG/2ML IJ SOLN
INTRAMUSCULAR | Status: DC | PRN
  Administered 2023-01-18: 4 mg via INTRAVENOUS

## 2023-01-18 MED ORDER — OXYCODONE HCL 5 MG/5ML PO SOLN: 5.0000 mg | Freq: Once | ORAL | Status: DC | PRN

## 2023-01-18 MED ORDER — ROCURONIUM BROMIDE 10 MG/ML (PF) SYRINGE
PREFILLED_SYRINGE | INTRAVENOUS | Status: DC | PRN
  Administered 2023-01-18: 50 mg via INTRAVENOUS
  Administered 2023-01-18: 10 mg via INTRAVENOUS

## 2023-01-18 MED ORDER — OXYCODONE HCL 5 MG PO TABS: 5.0000 mg | ORAL_TABLET | Freq: Once | ORAL | Status: DC | PRN

## 2023-01-18 MED ORDER — LIDOCAINE 2% (20 MG/ML) 5 ML SYRINGE
INTRAMUSCULAR | Status: DC | PRN
  Administered 2023-01-18: 60 mg via INTRAVENOUS

## 2023-01-18 MED ORDER — AMISULPRIDE (ANTIEMETIC) 5 MG/2ML IV SOLN: 10.0000 mg | Freq: Once | INTRAVENOUS | Status: DC | PRN

## 2023-01-18 MED ORDER — METHOCARBAMOL 500 MG IVPB - SIMPLE MED: 500.0000 mg | Freq: Four times a day (QID) | INTRAVENOUS | Status: DC | PRN

## 2023-01-18 SURGICAL SUPPLY — 110 items
AID PSTN UNV HD RSTRNT DISP (MISCELLANEOUS) ×2
APL PRP STRL LF DISP 70% ISPRP (MISCELLANEOUS) ×4
BAG COUNTER SPONGE SURGICOUNT (BAG) IMPLANT
BAG SPEC THK2 15X12 ZIP CLS (MISCELLANEOUS) ×2
BAG SPNG CNTER NS LX DISP (BAG)
BAG ZIPLOCK 12X15 (MISCELLANEOUS) ×2 IMPLANT
BASEPLATE P2 COATD GLND 6.5X30 (Shoulder) IMPLANT
BIT DRILL 1.6MX128 (BIT) IMPLANT
BIT DRILL 2.5 DIA 127 CALI (BIT) IMPLANT
BIT DRILL 4 DIA CALIBRATED (BIT) IMPLANT
BLADE SAW SGTL 73X25 THK (BLADE) ×2 IMPLANT
BLADE SURG 15 STRL LF DISP TIS (BLADE) ×2 IMPLANT
BLADE SURG 15 STRL SS (BLADE) ×2
BNDG CMPR 75X21 PLY HI ABS (MISCELLANEOUS)
BNDG CMPR 9X4 STRL LF SNTH (GAUZE/BANDAGES/DRESSINGS)
BNDG COHESIVE 1X5 TAN STRL LF (GAUZE/BANDAGES/DRESSINGS) ×2 IMPLANT
BNDG ESMARK 4X9 LF (GAUZE/BANDAGES/DRESSINGS) IMPLANT
BOOTIES KNEE HIGH SLOAN (MISCELLANEOUS) ×4 IMPLANT
BSPLAT GLND 30 STRL LF SHLDR (Shoulder) ×2 IMPLANT
CHLORAPREP W/TINT 26 (MISCELLANEOUS) ×2 IMPLANT
COOLER ICEMAN CLASSIC (MISCELLANEOUS) IMPLANT
CORD BIPOLAR FORCEPS 12FT (ELECTRODE) ×2 IMPLANT
COVER BACK TABLE 60X90IN (DRAPES) ×2 IMPLANT
COVER MAYO STAND STRL (DRAPES) IMPLANT
COVER SURGICAL LIGHT HANDLE (MISCELLANEOUS) ×2 IMPLANT
CUFF TOURN SGL QUICK 18 (TOURNIQUET CUFF) IMPLANT
DRAPE EXTREMITY T 121X128X90 (DISPOSABLE) ×2 IMPLANT
DRAPE IMP U-DRAPE 54X76 (DRAPES) ×2 IMPLANT
DRAPE INCISE IOBAN 66X45 STRL (DRAPES) ×2 IMPLANT
DRAPE ORTHO SPLIT 77X108 STRL (DRAPES) ×4
DRAPE POUCH INSTRU U-SHP 10X18 (DRAPES) ×2 IMPLANT
DRAPE SHEET LG 3/4 BI-LAMINATE (DRAPES) ×2 IMPLANT
DRAPE SURG 17X11 SM STRL (DRAPES) ×2 IMPLANT
DRAPE SURG 17X23 STRL (DRAPES) ×2 IMPLANT
DRAPE SURG ORHT 6 SPLT 77X108 (DRAPES) ×4 IMPLANT
DRAPE TOP 10253 STERILE (DRAPES) ×2 IMPLANT
DRAPE U-SHAPE 47X51 STRL (DRAPES) ×2 IMPLANT
DRSG AQUACEL AG ADV 3.5X 6 (GAUZE/BANDAGES/DRESSINGS) ×2 IMPLANT
DRSG EMULSION OIL 3X3 NADH (GAUZE/BANDAGES/DRESSINGS) ×2 IMPLANT
DURAPREP 26ML APPLICATOR (WOUND CARE) ×4 IMPLANT
ELECT BLADE TIP CTD 4 INCH (ELECTRODE) ×2 IMPLANT
ELECT REM PT RETURN 15FT ADLT (MISCELLANEOUS) ×2 IMPLANT
FACESHIELD WRAPAROUND (MASK) ×4 IMPLANT
FACESHIELD WRAPAROUND OR TEAM (MASK) ×2 IMPLANT
GAUZE SPONGE 4X4 12PLY STRL (GAUZE/BANDAGES/DRESSINGS) ×2 IMPLANT
GAUZE STRETCH 2X75IN STRL (MISCELLANEOUS) ×2 IMPLANT
GLOVE BIO SURGEON STRL SZ7 (GLOVE) ×2 IMPLANT
GLOVE BIO SURGEON STRL SZ7.5 (GLOVE) ×2 IMPLANT
GLOVE BIOGEL PI IND STRL 6.5 (GLOVE) ×2 IMPLANT
GLOVE BIOGEL PI IND STRL 7.0 (GLOVE) ×2 IMPLANT
GLOVE BIOGEL PI IND STRL 8 (GLOVE) ×2 IMPLANT
GLOVE SURG POLYISO LF SZ6.5 (GLOVE) ×2 IMPLANT
GLOVE SURG SS PI 6.5 STRL IVOR (GLOVE) ×2 IMPLANT
GOWN STRL REUS W/ TWL LRG LVL3 (GOWN DISPOSABLE) ×4 IMPLANT
GOWN STRL REUS W/ TWL XL LVL3 (GOWN DISPOSABLE) ×4 IMPLANT
GOWN STRL REUS W/TWL LRG LVL3 (GOWN DISPOSABLE) ×4
GOWN STRL REUS W/TWL XL LVL3 (GOWN DISPOSABLE) ×4
HANDPIECE INTERPULSE COAX TIP (DISPOSABLE) ×2
HOOD PEEL AWAY T7 (MISCELLANEOUS) ×6 IMPLANT
INSERT SMALL SOCKET 32MM NEU (Insert) IMPLANT
KIT BASIN OR (CUSTOM PROCEDURE TRAY) ×2 IMPLANT
KIT TURNOVER KIT A (KITS) IMPLANT
MANIFOLD NEPTUNE II (INSTRUMENTS) ×2 IMPLANT
NDL HYPO 25X1 1.5 SAFETY (NEEDLE) IMPLANT
NDL TROCAR POINT SZ 2 1/2 (NEEDLE) IMPLANT
NEEDLE HYPO 25X1 1.5 SAFETY (NEEDLE) ×2 IMPLANT
NEEDLE TROCAR POINT SZ 2 1/2 (NEEDLE) IMPLANT
NS IRRIG 1000ML POUR BTL (IV SOLUTION) ×2 IMPLANT
P2 COATDE GLNOID BSEPLT 6.5X30 (Shoulder) ×2 IMPLANT
PACK SHOULDER (CUSTOM PROCEDURE TRAY) ×2 IMPLANT
PAD COLD SHLDR WRAP-ON (PAD) IMPLANT
PADDING CAST ABS COTTON 4X4 ST (CAST SUPPLIES) ×2 IMPLANT
PROTECTOR NERVE ULNAR (MISCELLANEOUS) IMPLANT
RESTRAINT HEAD UNIVERSAL NS (MISCELLANEOUS) ×2 IMPLANT
RETRIEVER SUT HEWSON (MISCELLANEOUS) IMPLANT
SCREW BONE LOCKING RSP 5.0X14 (Screw) ×2 IMPLANT
SCREW BONE LOCKING RSP 5.0X30 (Screw) ×2 IMPLANT
SCREW BONE RSP LOCK 5X14 (Screw) IMPLANT
SCREW BONE RSP LOCK 5X18 (Screw) IMPLANT
SCREW BONE RSP LOCK 5X26 (Screw) IMPLANT
SCREW BONE RSP LOCK 5X30 (Screw) IMPLANT
SCREW BONE RSP LOCKING 18MM LG (Screw) ×2 IMPLANT
SCREW BONE RSP LOCKING 5.0X26 (Screw) ×2 IMPLANT
SCREW RETAIN W/HEAD 4MM OFFSET (Shoulder) IMPLANT
SET HNDPC FAN SPRY TIP SCT (DISPOSABLE) ×2 IMPLANT
SLING ARM IMMOBILIZER LRG (SOFTGOODS) IMPLANT
SLING ARM IMMOBILIZER MED (SOFTGOODS) IMPLANT
SPIKE FLUID TRANSFER (MISCELLANEOUS) IMPLANT
STEM HUMERAL 8X48 SHOULDER (Miscellaneous) IMPLANT
STOCKINETTE 4X48 STRL (DRAPES) ×2 IMPLANT
STRIP CLOSURE SKIN 1/2X4 (GAUZE/BANDAGES/DRESSINGS) ×4 IMPLANT
SUCTION FRAZIER HANDLE 10FR (MISCELLANEOUS)
SUCTION TUBE FRAZIER 10FR DISP (MISCELLANEOUS) IMPLANT
SUPPORT WRAP ARM LG (MISCELLANEOUS) ×2 IMPLANT
SUT ETHIBOND 2 V 37 (SUTURE) IMPLANT
SUT ETHILON 4 0 PS 2 18 (SUTURE) ×2 IMPLANT
SUT FIBERWIRE #2 38 REV NDL BL (SUTURE)
SUT MNCRL AB 4-0 PS2 18 (SUTURE) ×2 IMPLANT
SUT VIC AB 2-0 CT1 27 (SUTURE) ×4
SUT VIC AB 2-0 CT1 TAPERPNT 27 (SUTURE) ×4 IMPLANT
SUTURE FIBERWR#2 38 REV NDL BL (SUTURE) IMPLANT
SYR BULB EAR ULCER 3OZ GRN STR (SYRINGE) ×2 IMPLANT
SYR CONTROL 10ML LL (SYRINGE) IMPLANT
TAPE LABRALWHITE 1.5X36 (TAPE) IMPLANT
TAPE SUT LABRALTAP WHT/BLK (SUTURE) IMPLANT
TOWEL OR 17X26 10 PK STRL BLUE (TOWEL DISPOSABLE) ×2 IMPLANT
TOWEL OR NON WOVEN STRL DISP B (DISPOSABLE) ×2 IMPLANT
TUBE SUCTION HIGH CAP CLEAR NV (SUCTIONS) IMPLANT
UNDERPAD 30X36 HEAVY ABSORB (UNDERPADS AND DIAPERS) ×2 IMPLANT
WATER STERILE IRR 1000ML POUR (IV SOLUTION) ×2 IMPLANT

## 2023-01-18 NOTE — Discharge Instructions (Addendum)
Discharge Instructions after Reverse Total Shoulder Arthroplasty   A sling has been provided for you. You are to wear this at all times (except for bathing and dressing), until your first post operative visit with Dr. Ave Filter. Please also wear while sleeping at night. While you bath and dress, let the arm/elbow extend straight down to stretch your elbow. Wiggle your fingers and pump your first while your in the sling to prevent hand swelling. Use ice on the shoulder intermittently over the first 48 hours after surgery. Continue to use ice or and ice machine as needed after 48 hours for pain control/swelling.  Pain medicine has been prescribed for you.  Use your medicine liberally over the first 48 hours, and then you can begin to taper your use. You may take Extra Strength Tylenol or Tylenol only in place of the pain pills. DO NOT take ANY nonsteroidal anti-inflammatory pain medications: Advil, Motrin, Ibuprofen, Aleve, Naproxen or Naprosyn.  Take one aspirin a day for 2 weeks after surgery, unless you have an aspirin sensitivity/allergy or asthma.  Leave your dressing on until your first follow up visit.  You may shower with the dressing.  Hold your arm as if you still have your sling on while you shower. Simply allow the water to wash over the site and then pat dry. Make sure your axilla (armpit) is completely dry after showering. Discharge Instructions after Carpal Tunnel Release   You will have a light dressing on your hand.  You may begin gentle motion of your fingers and hand immediately, but you should not do any heavy lifting or gripping.  Elevate your hand for the first 48 hours after surgery. Pain medicine has been prescribed for you.  Use your medicine as needed over the first 48 hours, and then you can begin to taper your use. You may take Extra Strength Tylenol or Tylenol only in place of the pain pills.  Leave the dressing in place until the third day after your surgery and then  remove it and place a band-aid over the stitches.  After the bandage has been removed you may shower, but do not soak the incision.  You may drive a car when you are off of prescription pain medications and can safely control your vehicle with both hands.   Please call [phone] during normal business hours or [phone] after hours for any problems. Including the following:  - excessive redness of the incisions - drainage for more than 4 days - fever of more than 101.5 F  *Please note that pain medications will not be refilled after hours or on weekends.      Please call [phone] during normal business hours or [phone] after hours for any problems. Including the following:  - excessive redness of the incisions - drainage for more than 4 days - fever of more than 101.5 F  *Please note that pain medications will not be refilled after hours or on weekends.    Dental Antibiotics:  In most cases prophylactic antibiotics for Dental procdeures after total joint surgery are not necessary.  Exceptions are as follows:  1. History of prior total joint infection  2. Severely immunocompromised (Organ Transplant, cancer chemotherapy, Rheumatoid biologic meds such as Humera)  3. Poorly controlled diabetes (A1C &gt; 8.0, blood glucose over 200)  If you have one of these conditions, contact your surgeon for an antibiotic prescription, prior to your dental procedure.

## 2023-01-18 NOTE — Evaluation (Signed)
Occupational Therapy Evaluation Patient Details Name: Amy Underwood MRN: [id number] DOB: 1946/05/26 Today's Date: 01/18/2023   History of Present Illness 77 year old Right hand dominant patient who underwent REVERSE SHOULDER ARTHROPLASTY, CARPAL TUNNEL RELEASE under Dr. Ave Filter on 01/18/23.   Clinical Impression   Pt is a 77 year old female, s/p reverse shoulder replacement without functional use of RT dominant upper extremity secondary to effects of surgery and interscalene block and shoulder precautions. Therapist provided education and instruction to patient and son, Trey Paula, in regards to exercises, precautions, positioning, donning upper extremity clothing and bathing while maintaining shoulder precautions, ice/use of Ice Man, and edema management and donning/doffing sling. Patient and son verbalized understanding and demonstrated as needed. Patient needed assistance to donn overhead dress, pants, socks and shoes and provided with instruction on compensatory strategies to perform ADLs. Patient limited by decreased ROM in Right shoulder so therefore will need some form of assistance at home. Patient and son verbalized and/or demonstrated understanding to all instruction. Patient to follow up with MD for further therapy needs.        Recommendations for follow up therapy are one component of a multi-disciplinary discharge planning process, led by the attending physician.  Recommendations may be updated based on patient status, additional functional criteria and insurance authorization.   Assistance Recommended at Discharge Frequent or constant Supervision/Assistance  Patient can return home with the following A lot of help with bathing/dressing/bathroom    Functional Status Assessment  Patient has had a recent decline in their functional status and demonstrates the ability to make significant improvements in function in a reasonable and predictable amount of time.  Equipment Recommendations   None recommended by OT    Recommendations for Other Services       Precautions / Restrictions Precautions Precautions: Shoulder Shoulder Interventions: Off for dressing/bathing/exercises;At all times;Shoulder sling/immobilizer Precaution Booklet Issued: Yes (comment) Precaution Comments: Sling at all times except ADL/exercise Yes;  Non weight bearing Yes;  AROM elbow, wrist and hand to tolerance Ye;s  PROM of shoulder No;  AROM of shoulder No Required Braces or Orthoses: Sling Restrictions Weight Bearing Restrictions: Yes RUE Weight Bearing: Non weight bearing      Mobility Bed Mobility               General bed mobility comments: PACU recliner    Transfers Overall transfer level: Modified independent                        Balance Overall balance assessment: Modified Independent                                         ADL either performed or assessed with clinical judgement   ADL Overall ADL's : Needs assistance/impaired Eating/Feeding: Minimal assistance;Sitting   Grooming: Set up;Standing   Upper Body Bathing: Moderate assistance;Cueing for compensatory techniques;Adhering to UE precautions;With caregiver independent assisting   Lower Body Bathing: Min guard;Sit to/from stand;Sitting/lateral leans   Upper Body Dressing : Maximal assistance;Sitting;Cueing for UE precautions;Cueing for compensatory techniques;Adhering to UE precautions   Lower Body Dressing: Moderate assistance;Cueing for compensatory techniques;Cueing for sequencing;With caregiver independent assisting;Sit to/from stand;Sitting/lateral leans   Toilet Transfer: Modified Independent           Functional mobility during ADLs: Modified independent       Vision Baseline Vision/History: 1 Wears glasses Vision Assessment?: No apparent  visual deficits     Perception     Praxis      Pertinent Vitals/Pain Pain Assessment Pain Assessment: No/denies pain (still  numb)     Hand Dominance Right   Extremity/Trunk Assessment Upper Extremity Assessment Upper Extremity Assessment: RUE deficits/detail;LUE deficits/detail RUE: Unable to fully assess due to immobilization LUE Deficits / Details: Shoulder ROM to ~105 degrees. Pt reports waiting to have her LT shoulder operated on next. LUE Sensation: WNL LUE Coordination: WNL   Lower Extremity Assessment Lower Extremity Assessment: Overall WFL for tasks assessed       Communication Communication Communication: No difficulties   Cognition Arousal/Alertness: Awake/alert Behavior During Therapy: WFL for tasks assessed/performed Overall Cognitive Status: Within Functional Limits for tasks assessed                                       General Comments       Exercises  Pt was instructed in hand/wrist/forearm and elbow exercises with handout. OT demonstrated.    Shoulder Instructions Shoulder Instructions Donning/doffing shirt without moving shoulder: Moderate assistance;Patient able to independently direct caregiver;Caregiver independent with task Method for sponge bathing under operated UE: Caregiver independent with task;Moderate assistance;Patient able to independently direct caregiver Donning/doffing sling/immobilizer: Moderate assistance;Caregiver independent with task;Patient able to independently direct caregiver Correct positioning of sling/immobilizer: Moderate assistance;Caregiver independent with task;Patient able to independently direct caregiver ROM for elbow, wrist and digits of operated UE: Patient able to independently direct caregiver;Caregiver independent with task Sling wearing schedule (on at all times/off for ADL's): Independent;Patient able to independently direct caregiver;Caregiver independent with task Proper positioning of operated UE when showering: Patient able to independently direct caregiver;Supervision/safety;Caregiver independent with task Dressing  change: Caregiver independent with task;Patient able to independently direct caregiver Positioning of UE while sleeping: Independent;Patient able to independently direct caregiver;Caregiver independent with task    Home Living Family/patient expects to be discharged to:: Private residence Living Arrangements: Alone Available Help at Discharge: Available 24 hours/day;Family Type of Home: Apartment Home Access: Level entry     Home Layout: One level     Bathroom Shower/Tub: Producer, television/film/video: Handicapped height Bathroom Accessibility: Yes   Home Equipment: Grab bars - tub/shower;Grab bars - toilet;Shower seat;Hand held shower head   Additional Comments: Metallurgist. Adjustable bed without rail.      Prior Functioning/Environment Prior Level of Function : Independent/Modified Independent             Mobility Comments: Independent ADLs Comments: Independent        OT Problem List: Decreased range of motion;Decreased strength;Impaired UE functional use      OT Treatment/Interventions:      OT Goals(Current goals can be found in the care plan section) Acute Rehab OT Goals Patient Stated Goal: Go home with 24/7 care from family OT Goal Formulation: All assessment and education complete, DC therapy ADL Goals Additional ADL Goal #1: Pt and/or caregiver will demonstrate UE/LE dressing, donning/doffing of sling, correct positioning of RT UE, and compensatory strategies for RT axilla hygiene, all while correctly following all shoulder post-op precautions/restrictions.  OT Frequency:      Co-evaluation              AM-PAC OT "6 Clicks" Daily Activity     Outcome Measure Help from another person eating meals?: A Little Help from another person taking care of personal grooming?: A Little Help from another  person toileting, which includes using toliet, bedpan, or urinal?: A Little Help from another person bathing (including washing, rinsing, drying)?: A  Lot Help from another person to put on and taking off regular upper body clothing?: A Lot Help from another person to put on and taking off regular lower body clothing?: A Lot 6 Click Score: 15   End of Session Equipment Utilized During Treatment:  (sling) Nurse Communication: Other (comment) (Completion of OT.)  Activity Tolerance: Patient tolerated treatment well Patient left: in chair;with family/visitor present  OT Visit Diagnosis: Muscle weakness (generalized) (M62.81)                Time: 1050-1120 OT Time Calculation (min): 30 min Charges:  OT General Charges $OT Visit: 1 Visit OT Evaluation $OT Eval Low Complexity: 1 Low OT Treatments $Self Care/Home Management : 8-22 mins  Victorino Dike, OT Acute Rehab Services Office: [phone] 01/18/2023  Theodoro Clock 01/18/2023, 11:35 AM

## 2023-01-18 NOTE — Anesthesia Procedure Notes (Signed)
Anesthesia Regional Block: Interscalene brachial plexus block   Pre-Anesthetic Checklist: , timeout performed,  Correct Patient, Correct Site, Correct Laterality,  Correct Procedure, Correct Position, site marked,  Risks and benefits discussed,  Pre-op evaluation,  At surgeon's request and post-op pain management  Laterality: Right  Prep: Maximum Sterile Barrier Precautions used, chloraprep       Needles:  Injection technique: Single-shot  Needle Type: Echogenic Stimulator Needle     Needle Length: 4cm  Needle Gauge: 22     Additional Needles:   Procedures:,,,, ultrasound used (permanent image in chart),,    Narrative:  Start time: 01/18/2023 7:30 AM End time: 01/18/2023 7:33 AM Injection made incrementally with aspirations every 5 mL.  Performed by: Personally  Anesthesiologist: Kaylyn Layer, MD  Additional Notes: Risks, benefits, and alternative discussed. Patient gave consent for procedure. Patient prepped and draped in sterile fashion. Sedation administered, patient remains easily responsive to voice. Relevant anatomy identified with ultrasound guidance. Local anesthetic given in 5cc increments with no signs or symptoms of intravascular injection. No pain or paraesthesias with injection. Patient monitored throughout procedure with signs of LAST or immediate complications. Tolerated well. Ultrasound image placed in chart.  Amy Greenhouse, MD

## 2023-01-18 NOTE — Anesthesia Procedure Notes (Signed)
Procedure Name: Intubation Date/Time: 01/18/2023 8:04 AM  Performed by: Theodosia Quay, CRNAPre-anesthesia Checklist: Patient identified, Emergency Drugs available, Suction available, Patient being monitored and Timeout performed Patient Re-evaluated:Patient Re-evaluated prior to induction Oxygen Delivery Method: Circle system utilized Preoxygenation: Pre-oxygenation with 100% oxygen Induction Type: IV induction Ventilation: Mask ventilation without difficulty Laryngoscope Size: Mac and 3 Grade View: Grade II Tube type: Oral Tube size: 7.0 mm Number of attempts: 1 Airway Equipment and Method: Stylet Placement Confirmation: ETT inserted through vocal cords under direct vision, positive ETCO2, CO2 detector and breath sounds checked- equal and bilateral Secured at: 21 cm Tube secured with: Tape Dental Injury: Teeth and Oropharynx as per pre-operative assessment  Comments: ATOI

## 2023-01-18 NOTE — H&P (Signed)
Amy Underwood is an 77 y.o. female.   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  Pain interferes with sleep and quality of life.   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 Lalla Brothers   Psoriasis     Past Surgical History:  Procedure Laterality Date   ATRIAL FIBRILLATION ABLATION N/A 01/03/2022   Procedure: ATRIAL FIBRILLATION ABLATION;  Surgeon: Lanier Prude, MD;  Location: [facility] INVASIVE CV [facility];  Service: Cardiovascular;  Laterality: N/A;   CESAREAN SECTION     3   CHOLECYSTECTOMY     HIP ARTHROPLASTY Left    KNEE ARTHROPLASTY Bilateral    LEFT ATRIAL APPENDAGE OCCLUSION N/A 04/20/2022   Procedure: LEFT ATRIAL APPENDAGE OCCLUSION;  Surgeon: Lanier Prude, MD;  Location: [facility] INVASIVE CV [facility];  Service: Cardiovascular;  Laterality: N/A;   TEE WITHOUT CARDIOVERSION N/A 04/20/2022   Procedure: TRANSESOPHAGEAL ECHOCARDIOGRAM (TEE);  Surgeon: Lanier Prude, MD;  [facility]: [facility] INV[facility] CV [facility];  Service: Cardiovascular;  Laterality: N/A;    Family History  Problem Relation Age of Onset   Heart disease Mother    Breast cancer Neg Hx    Social History:  reports that she has quit smoking. Her smoking use included cigarettes. She started smoking about 23 years ago. She has a 50.00 pack-year smoking history. She has quit using smokeless tobacco. She reports that she does not drink alcohol and does  not use drugs.  Allergies:  Allergies  Allergen Reactions   Alendronate Palpitations   Cefaclor Hives and Rash    Medications Prior to Admission  Medication Sig Dispense Refill   albuterol (VENTOLIN HFA) 108 (90 Base) MCG/ACT inhaler Inhale 1-2 puffs into the lungs every 6 (six) hours as needed for wheezing or shortness of breath. 6.7 g 0   allopurinol (ZYLOPRIM) 100 MG tablet Take 100 mg by mouth at bedtime.     amLODipine (NORVASC) 10 MG tablet Take 1 tablet (10 mg total) by mouth daily. 180 tablet 3   Apremilast (OTEZLA) 30 MG TABS Take 1 tablet (30 mg total) by mouth 2 (two) times daily. 180 tablet 3   atorvastatin (LIPITOR) 20 MG tablet TAKE 1 TABLET BY MOUTH ONCE  DAILY FOR CHOLESTEROL 90 tablet 2   Calcium-Magnesium-Vitamin D (CALCIUM 1200+D3 PO) Take 1 tablet by mouth daily.     Cholecalciferol (DIALYVITE VITAMIN D 5000) 125 MCG (5000 UT) capsule Take 5,000 Units by mouth daily.     colchicine 0.6 MG tablet Take 2 tablets at gout onset, then take 1 tablet 2 hours later, then take 1 tablet twice daily thereafter until flare resolves. (Patient taking differently: Take 0.6 mg by mouth See admin instructions. Take 2 tablets at gout onset, then take 1 tablet 2 hours later, then take 1 tablet twice daily thereafter until flare resolves. As needed) 60 tablet 0   cyanocobalamin 1000 MCG tablet Take 1,000 mcg by mouth  daily. Vit B12     DULoxetine (CYMBALTA) 60 MG capsule Take 1 capsule (60 mg total) by mouth daily. 90 capsule 3   famotidine (PEPCID) 20 MG tablet TAKE 1 TABLET BY MOUTH DAILY FOR HEARTBURN 90 tablet 1   FARXIGA 10 MG TABS tablet Take 10 mg by mouth every morning.     ferrous sulfate 325 (65 FE) MG tablet Take 650 mg by mouth See admin instructions. Every three weeks for one week     ibandronate (BONIVA) 150 MG tablet Take 1 tablet (150 mg total) by mouth every 30 (thirty) days. Take in the morning with a full glass of water, on an empty stomach, and do not take anything else by  mouth or lie down for the next 30 min. 3 tablet 3   losartan (COZAAR) 100 MG tablet TAKE 1 TABLET BY MOUTH ONCE  DAILY FOR BLOOD PRESSURE 90 tablet 2   MAGnesium-Oxide 400 (240 Mg) MG tablet TAKE 1 TABLET(400 MG) BY MOUTH DAILY 90 tablet 3   triamcinolone cream (KENALOG) 0.1 % Apply 1 Application topically daily as needed. Up to 5 days per week. Avoid face, groin, underarms. 80 g 3    No results found for this or any previous visit (from the past 48 hour(s)). No results found.  Review of Systems  All other systems reviewed and are negative.   Blood pressure (!) 171/85, pulse 79, temperature 98.5 F (36.9 C), temperature source Oral, resp. rate 18, height 5\' 3"  (1.6 m), weight 81 kg, SpO2 96 %. Physical Exam Eyes:     Extraocular Movements: Extraocular movements intact.  Pulmonary:     Effort: Pulmonary effort is normal.  Musculoskeletal:     Comments: R shoulder pain with limited ROM. Distally with dec STLT in med nerve dist.  Neurological:     Mental Status: She is alert.      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

## 2023-01-18 NOTE — Transfer of Care (Signed)
Immediate Anesthesia Transfer of Care Note  Patient: Amy Underwood  Procedure(s) Performed: Procedure(s): REVERSE SHOULDER ARTHROPLASTY (Right) CARPAL TUNNEL RELEASE (Right)  Patient Location: PACU  Anesthesia Type:GA combined with regional for post-op pain  Level of Consciousness:  sedated, patient cooperative and responds to stimulation  Airway & Oxygen Therapy:Patient Spontanous Breathing and Patient connected to face mask oxgen  Post-op Assessment:  Report given to PACU RN and Post -op Vital signs reviewed and stable  Post vital signs:  Reviewed and stable  Last Vitals:  Vitals:   01/18/23 0601 01/18/23 0625  BP: (!) 189/96 (!) 171/85  Pulse: 78 79  Resp: 18   Temp: 36.9 C   SpO2: 96%     Complications: No apparent anesthesia complications

## 2023-01-18 NOTE — Op Note (Addendum)
Procedure(s): REVERSE SHOULDER ARTHROPLASTY CARPAL TUNNEL RELEASE Procedure Note  Kimble Kostiuk female 77 y.o. 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 female  With endstage right irrepairable rotator cuff tear and severe carpal tunnel syndrome. Pain and dysfunction interfered with quality of life and nonoperative treatment with activity modification, NSAIDS and injections failed.     Surgeon: Glennon Hamilton   Assistants: Fredia Sorrow PA-C Amber was present and scrubbed throughout the procedure and was essential in positioning, retraction, exposure, and closure)  Anesthesia: General endotracheal anesthesia with preoperative interscalene block given by the attending anesthesiologist    Procedure Detail  REVERSE SHOULDER ARTHROPLASTY, CARPAL TUNNEL RELEASE   Estimated Blood Loss:  200 mL         Drains: none  Blood Given: none          Specimens: none        Complications:  * No complications entered in OR log *         Disposition: PACU - hemodynamically stable.         Condition: stable      OPERATIVE FINDINGS:  A DJO Altivate pressfit reverse total shoulder arthroplasty was placed with a  size 8 stem, a 32-4 glenosphere, and a standard-mm poly insert. The base plate  fixation was excellent.  PROCEDURE: The patient was identified in the preoperative holding area  where I personally marked the operative site after verifying site, side,  and procedure with the patient. An interscalene block given by  the attending anesthesiologist in the holding area and the patient was taken back to the operating room where all extremities were  carefully padded in position after general anesthesia was  induced. She  was placed in a beach-chair position and the operative upper extremity was  prepped and draped in a standard sterile fashion.   Attention was first turned to the right wrist.  A sterile tourniquet was applied to the forearm.  The limb was exsanguinated and the tourniquet was elevated 250 mmHg.  An approximate 3 cm incision was made longitudinally from the dominant wrist crease distally in line with the interdigital space of the third and fourth digit.  Dissection was carried down through subcutaneous tissues to the underlying palmar fascia which was split longitudinally in line with the incision.  Once expose the underlying transcarpal ligament was identified and a small rent was made.  A Freer elevator was used proximally and distally to protect the underlying contents of the carpal tunnel while the ligament was sharply released.  Final release was carried out under direct visualization in both directions with tenotomy scissors.  The final release was felt to be complete.  The underlying nerve was healthy appearing and intact.  At this point irrigation was used and the wound was closed with 4-0 nylon suture in an interrupted fashion.  A light sterile dressing was applied.   Attention was turned back to the shoulder. An approximately 10-  cm incision was made from the tip of the coracoid process to the center  point of the humerus at the level of the axilla. Dissection was carried  down through subcutaneous tissues to the level of the cephalic vein  which was taken laterally with the deltoid. The pectoralis  major was  retracted medially. The subdeltoid space was developed and the lateral  edge of the conjoined tendon was identified. The undersurface of  conjoined tendon was palpated and the musculocutaneous nerve was not in  the field. Retractor was placed underneath the conjoined and second  retractor was placed lateral into the deltoid. The circumflex humeral  artery and vessels were  identified and clamped and coagulated. The  biceps tendon was absent.  The subscapularis was taken down as a peel with the underlying capsule.  The  joint was then gently externally rotated while the capsule was released  from the humeral neck around to just beyond the 6 o'clock position. At  this point, the joint was dislocated and the humeral head was presented  into the wound. The excessive osteophyte formation was removed with a  large rongeur.  The cutting guide was used to make the appropriate  head cut and the head was saved for potentially bone grafting.  The glenoid was exposed with the arm in an  abducted extended position. The anterior and posterior labrum were  completely excised and the capsule was released circumferentially to  allow for exposure of the glenoid for preparation. The 2.5 mm drill was  placed using the guide in 5-10 inferior angulation and the tap was then advanced in the same hole. Small and large reamers were then used. The tap was then removed and the Metaglene was then screwed in with excellent purchase.  The peripheral guide was then used to drilled measured and filled peripheral locking screws. The size 32-4 glenosphere was then impacted on the [facility] taper and the central screw was placed. The humerus was then again exposed and the diaphyseal reamers were used followed by the metaphyseal reamers. The final broach was left in place in the proximal trial was placed. The joint was reduced and with this implant it was felt that soft tissue tensioning was appropriate with excellent stability and excellent range of motion. Therefore, final humeral stem was placed press-fit with bone graft.  And then the trial polyethylene inserts were tested again and the above implant was felt to be the most appropriate for final insertion. The joint was reduced taken through full range of motion and felt to be stable. Soft tissue tension was appropriate.  The joint was then copiously  irrigated with pulse  lavage and the wound was then closed. The subscapularis was not repaired.  Skin was closed with 2-0 Vicryl in a deep dermal layer and 4-0  Monocryl for skin closure. Steri-Strips were applied. Sterile  dressings were then applied as well as a sling. The patient was allowed  to awaken from general anesthesia, transferred to stretcher, and taken  to recovery room in stable condition.   POSTOPERATIVE PLAN: The patient will be observed in the recovery room and if her pain is well-controlled with regional anesthesia and she is hemodynamically stable she can be discharged home today with family.

## 2023-01-18 NOTE — Anesthesia Postprocedure Evaluation (Signed)
Anesthesia Post Note  Patient: Amy Underwood  Procedure(s) Performed: REVERSE SHOULDER ARTHROPLASTY (Right: Shoulder) CARPAL TUNNEL RELEASE (Right)     Patient location during evaluation: PACU Anesthesia Type: General Level of consciousness: awake and alert Pain management: pain level controlled Vital Signs Assessment: post-procedure vital signs reviewed and stable Respiratory status: spontaneous breathing, nonlabored ventilation and respiratory function stable Cardiovascular status: blood pressure returned to baseline Postop Assessment: no apparent nausea or vomiting Anesthetic complications: no   No notable events documented.  Last Vitals:  Vitals:   01/18/23 0930 01/18/23 0945  BP: (!) 123/57 (!) 145/112  Pulse: 70 69  Resp: 18 17  Temp:    SpO2: 95% 93%    Last Pain:  Vitals:   01/18/23 0945  TempSrc:   PainSc: 0-No pain                 Shanda Howells

## 2023-01-19 ENCOUNTER — Encounter (HOSPITAL_COMMUNITY): Payer: Self-pay | Admitting: Orthopedic Surgery

## 2023-02-07 ENCOUNTER — Emergency Department: Payer: Medicare Other

## 2023-02-07 ENCOUNTER — Other Ambulatory Visit: Payer: Self-pay

## 2023-02-07 ENCOUNTER — Telehealth: Payer: Self-pay | Admitting: Cardiology

## 2023-02-07 ENCOUNTER — Encounter: Payer: Self-pay | Admitting: Emergency Medicine

## 2023-02-07 DIAGNOSIS — N184 Chronic kidney disease, stage 4 (severe): Secondary | ICD-10-CM | POA: Diagnosis not present

## 2023-02-07 DIAGNOSIS — I130 Hypertensive heart and chronic kidney disease with heart failure and stage 1 through stage 4 chronic kidney disease, or unspecified chronic kidney disease: Secondary | ICD-10-CM | POA: Insufficient documentation

## 2023-02-07 DIAGNOSIS — Z79899 Other long term (current) drug therapy: Secondary | ICD-10-CM | POA: Diagnosis not present

## 2023-02-07 DIAGNOSIS — Z96642 Presence of left artificial hip joint: Secondary | ICD-10-CM | POA: Diagnosis not present

## 2023-02-07 DIAGNOSIS — Z96653 Presence of artificial knee joint, bilateral: Secondary | ICD-10-CM | POA: Diagnosis not present

## 2023-02-07 DIAGNOSIS — R600 Localized edema: Secondary | ICD-10-CM | POA: Insufficient documentation

## 2023-02-07 DIAGNOSIS — R7989 Other specified abnormal findings of blood chemistry: Secondary | ICD-10-CM | POA: Insufficient documentation

## 2023-02-07 DIAGNOSIS — R079 Chest pain, unspecified: Secondary | ICD-10-CM | POA: Diagnosis present

## 2023-02-07 DIAGNOSIS — I509 Heart failure, unspecified: Secondary | ICD-10-CM | POA: Diagnosis not present

## 2023-02-07 DIAGNOSIS — J45909 Unspecified asthma, uncomplicated: Secondary | ICD-10-CM | POA: Insufficient documentation

## 2023-02-07 NOTE — Telephone Encounter (Signed)
New message:    Patient said she had an episode this morning, while visiting her sister in [place], [place]. She went to the [facility]. They wanted  to admit her for Congested Failure. She did not want to be admitted there. Dhe said she wanted to see Dr Lalla Brothers or Rosalita Chessman first.    Pt c/o of Chest Pain: STAT if CP now or developed within 24 hours  1. Are you having CP right now? Chest discomfort and hurting in her chest this morning, hurting some at this time  2. Are you experiencing any other symptoms (ex. SOB, nausea, vomiting, sweating)? Shortness of breath earlier today, but not at this time.  3. How long have you been experiencing CP?  last night- went to the ER last night in [place], [place]  4. Is your CP continuous or coming and going? Comes and goes  5. Have you taken Nitroglycerin? Yes- nitro patch, she did get some rrelief with the patch ?

## 2023-02-07 NOTE — Telephone Encounter (Signed)
Spoke with patient and she stated that she had an episode chest pain accompanied with difficulty breathing this morning, while visiting her sister in [place], [place]. She went to the [facility] ED where they wanted to admit her for CHF. She stated that she did not want to be admitted there since she was out of town. She said she wanted to see Dr. Lalla Brothers or Amy Sjogren, NP first before making any decisions. Patient denies chest pain and dyspnea currently but states she feels really exhausted. Informed patient we could get her an appointment with Amy Sjogren, NP but that if her symptoms returned/worsened then she would need to present to the nearest ED and should follow their recommendations. Patient is scheduled 02/08/23 at 11:00 AM. Patient understood with read back

## 2023-02-07 NOTE — ED Triage Notes (Signed)
Patient ambulatory to triage with steady gait, without difficulty or distress noted; pt reports having mid CP, nonradiating accomp by [facility]; st seen out of town this morning and was dx with CHF

## 2023-02-07 NOTE — Progress Notes (Deleted)
Cardiology Office Note Date:  02/07/2023  Patient ID:  Amy Underwood, Amy Underwood [birth year], MRN [id number] PCP:  [name], NP  Cardiologist:  None Electrophysiologist: Lanier Prude, MD  ***refresh   Chief Complaint: ***  History of Present Illness: Amy Underwood is a 77 y.o. female with PMH notable for persis AFib, s/p LAAO (Watchman), HTN; seen today for Lanier Prude, MD for acute visit due to ***. She is s/p AFib ablation 01/2022, s/p watchman 04/2022. Post-LAAO CT showed well-placed device with no leak and full endothelialization.  Tikosyn stopped 11/2022 given no AF episode since ablation.   She went to OSH ER 6/4 with chest pain, SOB. Per patient, they had planned to admit her with CHF, but she refused and wanted to be seen closer to home.   *** AF burden, symptoms *** palpitations *** bleeding concerns  *** fluid status *** SOB *** exercise tolerance  Patient reports ***.  she denies chest pain, palpitations, dyspnea, PND, orthopnea, nausea, vomiting, dizziness, syncope, edema, weight gain, or early satiety.     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 Lalla Brothers   Psoriasis     Past Surgical History:  Procedure Laterality Date   ATRIAL FIBRILLATION ABLATION N/A 01/03/2022   Procedure: ATRIAL FIBRILLATION ABLATION;  Surgeon: Lanier Prude, MD;  Location: [facility] INV[facility]IVE CV [facility];  Service: Cardiovascular;  Laterality: N/A;   CARPAL TUNNEL RELEASE Right  01/18/2023   Procedure: CARPAL TUNNEL RELEASE;  Surgeon: Jones Broom, MD;  Location: [facility];  Service: Orthopedics;  Laterality: Right;   CESAREAN SECTION     3   CHOLECYSTECTOMY     HIP ARTHROPLASTY Left    KNEE ARTHROPLASTY Bilateral    LEFT ATRIAL APPENDAGE OCCLUSION N/A 04/20/2022   Procedure: LEFT ATRIAL APPENDAGE OCCLUSION;  Surgeon: Lanier Prude, MD;  Location: [facility] CV [facility];  Service: Cardiovascular;  Laterality: N/A;   REVERSE SHOULDER ARTHROPLASTY Right 01/18/2023   Procedure: REVERSE SHOULDER ARTHROPLASTY;  Surgeon: Jones Broom, MD;  Location: [facility];  Service: Orthopedics;  Laterality: Right;   TEE WITHOUT CARDIOVERSION N/A 04/20/2022   Procedure: TRANSESOPHAGEAL ECHOCARDIOGRAM (TEE);  Surgeon: Lanier Prude, MD;  Location: [facility] INVASIVE CV LAB;  Service: Cardiovascular;  Laterality: N/A;    Current Outpatient Medications  Medication Instructions   albuterol (VENTOLIN HFA) 108 (90 Base) MCG/ACT inhaler 1-2 puffs, Inhalation, Every 6 hours PRN   allopurinol (ZYLOPRIM) 100 mg, Oral, Daily at bedtime   amLODipine (NORVASC) 10 mg, Oral, Daily   atorvastatin (LIPITOR) 20 mg, Oral, Daily, for cholesterol.   Calcium-Magnesium-Vitamin D (CALCIUM 1200+D3 PO) 1 tablet, Oral, Daily   colchicine 0.6 MG tablet Take 2 tablets at gout onset, then take 1 tablet 2 hours later, then take 1 tablet twice daily thereafter until flare resolves.   cyanocobalamin 1,000 mcg, Oral, Daily, Vit B12   Dialyvite Vitamin D 5000 5,000 Units, Oral, Daily   DULoxetine (CYMBALTA) 60 mg, Oral,  Daily   famotidine (PEPCID) 20 MG tablet TAKE 1 TABLET BY MOUTH DAILY FOR HEARTBURN   Farxiga 10 mg, Oral, Every morning   ferrous sulfate 650 mg, Oral, See admin instructions, Every three weeks for one week   ibandronate (BONIVA) 150 mg, Oral, Every 30 days, Take in the morning with a full glass of water, on an empty stomach, and do not take anything else by mouth or lie down for the next 30 min.    losartan (COZAAR) 100 mg, Oral, Daily, for blood pressure   MAGnesium-Oxide 400 (240 Mg) MG tablet TAKE 1 TABLET(400 MG) BY MOUTH DAILY   Otezla 30 mg, Oral, 2 times daily   oxyCODONE (ROXICODONE) 5 mg, Oral, Every 4 hours PRN   tiZANidine (ZANAFLEX) 2-4 mg, Oral, Daily   triamcinolone cream (KENALOG) 0.1 % 1 Application, Topical, Daily PRN, Up to 5 days per week. Avoid face, groin, underarms.    Social History:  The patient  reports that she has quit smoking. Her smoking use included cigarettes. She started smoking about 23 years ago. She has a 50.00 pack-year smoking history. She has quit using smokeless tobacco. She reports that she does not drink alcohol and does not use drugs.   Family History:  *** include only if pertinent The patient's family history includes Heart disease in her mother.***  ROS:  Please see the history of present illness. All other systems are reviewed and otherwise negative.   PHYSICAL EXAM: *** VS:  There were no vitals taken for this visit. BMI: There is no height or weight on file to calculate BMI.  GEN- The patient is well appearing, alert and oriented x 3 today.   Lungs- Clear to ausculation bilaterally, normal work of breathing.  Heart- {Blank single:19197::"Regular","Irregularly irregular"} rate and rhythm, no murmurs, rubs or gallops Extremities- {EDEMA LEVEL:28147::"No"} peripheral edema, warm, dry   EKG is ordered. Personal review of EKG from today shows:  ***  Recent Labs: 01/04/2023: BUN 23; Creatinine, Ser 1.26; Hemoglobin 12.0; Platelets 168; Potassium 4.4; Sodium 140  06/01/2022: Cholesterol 164; HDL 84.60; LDL Cholesterol 63; Total CHOL/HDL Ratio 2; Triglycerides 82.0; VLDL 16.4   CrCl cannot be calculated (Patient's most recent lab result is older than the maximum 21 days allowed.).   Wt Readings from Last 3 Encounters:  01/18/23 178 lb 9.2 oz (81 kg)  01/04/23 180 lb (81.6 kg)  12/26/22 191 lb (86.6 kg)     Additional studies reviewed  include: Previous EP, cardiology notes.   Cardiac CT, 06/22/2022 1. Well placed 31 mm Watchman FLX with no leak, full endothelialization and average compression 18%  2.  Moderate bi atrial enlargement  3. 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  Normal ascending thoracic aorta 3.5 cm  5.  No perciardial effusion  6.  Normal PV anatomy measurements above  AF ablation, 01/03/2022 1. Successful PVI 2. Successful ablation/isolation of the posterior wall 3.  Successful ablation of an perimitral (clockwise) atypical atrial flutter with a right superior pulmonary vein to mitral valve annulus ablation line 4.  Successful ablation of an atypical atrial flutter utilizing the anterior ridge as a critical isthmus 5.  Successful ablation of an atypical atrial flutter (roof dependent) with a posterior wall ablation/isolation 6. Intracardiac echo reveals dilated left atrium, le49ft-sided common ostium, small pericardial effusion at baseline 7. No early apparent complications. 8.  Colchicine 0.6 mg by mouth twice daily for 5 days 9.  Protonix 40 mg by mouth once  daily for 45 days 10.  Hold calcium channel blocker given junctional rhythm at the end of the case.  TTE, 10/27/2021  1. Left ventricular ejection fraction, by estimation, is 60 to 65%. Left ventricular ejection fraction by 2D MOD biplane is 63.8 %. The left ventricle has normal function. The left ventricle has no regional wall motion abnormalities. There is moderate left ventricular hypertrophy of the basal-septal segment. Left ventricular diastolic parameters are consistent with Grade II diastolic dysfunction (pseudonormalization). The average left ventricular global longitudinal strain is -17.8 %. The global longitudinal strain is normal.   2. Right ventricular systolic function is normal. The right ventricular size is normal. There is normal pulmonary artery systolic pressure.   3. Left atrial size was mildly  dilated.   4. The mitral valve is degenerative. Mild mitral valve regurgitation.   5. The aortic valve is calcified. Aortic valve regurgitation is mild. Mild aortic valve stenosis. Aortic valve mean gradient measures 8.0 mmHg.   6. The inferior vena cava is normal in size with greater than 50% respiratory variability, suggesting right atrial pressure of 3 mmHg.    ASSESSMENT AND PLAN:  #) Parox AFib   #) S/p LAAO - Watchman Most recent cardiac CT shows no leak with full endothelialization No further NOAC    Current medicines are reviewed at length with the patient today.   The patient {ACTIONS; HAS/DOES NOT HAVE:19233} concerns regarding her medicines.  The following changes were made today:  {NONE DEFAULTED:18576}  Labs/ tests ordered today include: *** No orders of the defined types were placed in this encounter.    Disposition: Follow up with {EPMDS:28135} in {EPFOLLOW UP:28173}   Signed, Sherie Don, NP  02/07/23  5:34 PM  Electrophysiology [facility]

## 2023-02-08 ENCOUNTER — Ambulatory Visit: Payer: Medicare Other | Admitting: Cardiology

## 2023-02-08 ENCOUNTER — Emergency Department
Admission: EM | Admit: 2023-02-08 | Discharge: 2023-02-08 | Disposition: A | Discharge status: Home / Self Care | Payer: Medicare Other | Attending: Emergency Medicine | Admitting: Emergency Medicine

## 2023-02-08 DIAGNOSIS — I4819 Other persistent atrial fibrillation: Secondary | ICD-10-CM

## 2023-02-08 DIAGNOSIS — I10 Essential (primary) hypertension: Secondary | ICD-10-CM

## 2023-02-08 DIAGNOSIS — I509 Heart failure, unspecified: Secondary | ICD-10-CM

## 2023-02-08 DIAGNOSIS — R079 Chest pain, unspecified: Secondary | ICD-10-CM

## 2023-02-08 DIAGNOSIS — Z95818 Presence of other cardiac implants and grafts: Secondary | ICD-10-CM

## 2023-02-08 LAB — COMPREHENSIVE METABOLIC PANEL
ALT: 18 U/L (ref 0–44)
AST: 26 U/L (ref 15–41)
Albumin: 4.3 g/dL (ref 3.5–5.0)
Alkaline Phosphatase: 161 U/L — ABNORMAL HIGH (ref 38–126)
Anion gap: 13 (ref 5–15)
BUN: 23 mg/dL (ref 8–23)
CO2: 24 mmol/L (ref 22–32)
Calcium: 9.5 mg/dL (ref 8.9–10.3)
Chloride: 105 mmol/L (ref 98–111)
Creatinine, Ser: 1.22 mg/dL — ABNORMAL HIGH (ref 0.44–1.00)
GFR, Estimated: 46 mL/min — ABNORMAL LOW (ref >60)
Glucose, Bld: 120 mg/dL — ABNORMAL HIGH (ref 70–99)
Potassium: 3.4 mmol/L — ABNORMAL LOW (ref 3.5–5.1)
Sodium: 142 mmol/L (ref 135–145)
Total Bilirubin: 1.5 mg/dL — ABNORMAL HIGH (ref 0.3–1.2)
Total Protein: 7.1 g/dL (ref 6.5–8.1)

## 2023-02-08 LAB — CBC WITH DIFFERENTIAL/PLATELET
Abs Immature Granulocytes: 0.01 10*3/uL (ref 0.00–0.07)
Basophils Absolute: 0 10*3/uL (ref 0.0–0.1)
Basophils Relative: 1 %
Eosinophils Absolute: 0.1 10*3/uL (ref 0.0–0.5)
Eosinophils Relative: 3 %
HCT: 37.8 % (ref 36.0–46.0)
Hemoglobin: 11.6 g/dL — ABNORMAL LOW (ref 12.0–15.0)
Immature Granulocytes: 0 %
Lymphocytes Relative: 16 %
Lymphs Abs: 0.8 10*3/uL (ref 0.7–4.0)
MCH: 28.1 pg (ref 26.0–34.0)
MCHC: 30.7 g/dL (ref 30.0–36.0)
MCV: 91.5 fL (ref 80.0–100.0)
Monocytes Absolute: 0.5 10*3/uL (ref 0.1–1.0)
Monocytes Relative: 11 %
Neutro Abs: 3.2 10*3/uL (ref 1.7–7.7)
Neutrophils Relative %: 69 %
Platelets: 197 10*3/uL (ref 150–400)
RBC: 4.13 MIL/uL (ref 3.87–5.11)
RDW: 15 % (ref 11.5–15.5)
WBC: 4.6 10*3/uL (ref 4.0–10.5)
nRBC: 0 % (ref 0.0–0.2)

## 2023-02-08 LAB — BRAIN NATRIURETIC PEPTIDE: B Natriuretic Peptide: 309.1 pg/mL — ABNORMAL HIGH (ref 0.0–100.0)

## 2023-02-08 LAB — TROPONIN I (HIGH SENSITIVITY)
Troponin I (High Sensitivity): 36 ng/L — ABNORMAL HIGH (ref <18)
Troponin I (High Sensitivity): 37 ng/L — ABNORMAL HIGH (ref <18)

## 2023-02-08 MED ORDER — FUROSEMIDE 10 MG/ML IJ SOLN
20.0000 mg | Freq: Once | INTRAMUSCULAR | Status: AC
  Administered 2023-02-08: 20 mg via INTRAVENOUS
  Filled 2023-02-08: qty 4

## 2023-02-08 NOTE — ED Provider Notes (Signed)
[facility] Provider Note    Event Date/Time   First MD Initiated Contact with Patient 02/08/23 0533     (approximate)   History   Chest Pain   HPI  Amy Underwood is a 77 y.o. female who presents to the ED from home with a chief complaint of chest pain and shortness of breath.  Patient with a history of PAF status post ablation and Watchman, not currently on anticoagulation.  Yesterday she was [place] when she experienced shortness of breath.  She was seen in the ED at [facility], had negative CTA chest for PE, diagnosed with CHF.  States doctor wanted to hospitalize her but she wanted to return home where her cardiologist is.  Has an appointment with her cardiologist this morning at 11 AM.  Patient was not diuresed yesterday in the ED; receive Lasix prescription for 3 days.  Denies associated diaphoresis, palpitations, nausea/vomiting or dizziness.  Recent left shoulder surgery.  Denies fever/chills, abdominal pain, dysuria or diarrhea.     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 Lalla Brothers   Psoriasis      Active Problem List   Patient Active Problem List   Diagnosis Date Noted   Preoperative clearance 12/26/2022   Bilateral carpal tunnel syndrome 12/20/2022   Paresthesia of both hands 09/21/2022   Arthritis of both glenohumeral joints 06/01/2022   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    Secondary hypercoagulable state (HCC) 01/31/2022   Chronic gout 12/14/2021   Varicose veins of leg with pain, bilateral 08/16/2021   Trigger finger of left hand 07/18/2021   Anemia in chronic kidney disease 07/11/2021   Chronic kidney disease, stage IV (severe) (HCC) 07/11/2021   Mixed hyperlipidemia 07/05/2020   Chronic anticoagulation 10/02/2019   History of revision of total replacement of right hip joint 07/28/2019   Paroxysmal atrial fibrillation (HCC) 01/08/2019   Heart murmur 12/18/2016   Asthma 09/23/2014   Essential hypertension 09/23/2014   Gastroesophageal reflux disease 09/23/2014   Pernicious anemia 09/23/2014   Psoriasis 09/23/2014   Hip hematoma, left 03/02/2014   IDA (iron deficiency anemia) 07/09/2013     Past Surgical History   Past Surgical History:  Procedure Laterality Date   ATRIAL FIBRILLATION ABLATION N/A 01/03/2022   Procedure: ATRIAL FIBRILLATION ABLATION;  Surgeon: Lanier Prude, MD;  Location: [facility] INVASIVE CV LAB;  Service: Cardiovascular;  Laterality: N/A;   CARPAL TUNNEL RELEASE Right 01/18/2023   Procedure: CARPAL TUNNEL RELEASE;  Surgeon: Jones Broom, MD;  Location: [facility] ORS;  Service: Orthopedics;  Laterality: Right;   CESAREAN SECTION     3   CHOLECYSTECTOMY     HIP ARTHROPLASTY Left    KNEE ARTHROPLASTY Bilateral    LEFT ATRIAL APPENDAGE OCCLUSION N/A 04/20/2022   Procedure: LEFT ATRIAL APPENDAGE OCCLUSION;  Surgeon: Lanier Prude, MD;  Location: [facility] INVASIVE CV LAB;  Service: Cardiovascular;  Laterality: N/A;  REVERSE SHOULDER ARTHROPLASTY Right 01/18/2023   Procedure: REVERSE SHOULDER ARTHROPLASTY;  Surgeon: Jones Broom, MD;  Location: [facility] ORS;  Service: Orthopedics;  Laterality: Right;   TEE WITHOUT CARDIOVERSION N/A 04/20/2022   Procedure: TRANSESOPHAGEAL ECHOCARDIOGRAM (TEE);  Surgeon: Lanier Prude, MD;  Location: [facility] INVASIVE CV LAB;  Service: Cardiovascular;  Laterality: N/A;     Home Medications   Prior to  Admission medications   Medication Sig Start Date End Date Taking? Authorizing Provider  albuterol (VENTOLIN HFA) 108 (90 Base) MCG/ACT inhaler Inhale 1-2 puffs into the lungs every 6 (six) hours as needed for wheezing or shortness of breath. 09/23/21   [name], NP  allopurinol (ZYLOPRIM) 100 MG tablet Take 100 mg by mouth at bedtime. 12/21/21   [provider]  amLODipine (NORVASC) 10 MG tablet Take 1 tablet (10 mg total) by mouth daily. 01/10/22   Graciella Freer, PA-C  Apremilast (OTEZLA) 30 MG TABS Take 1 tablet (30 mg total) by mouth 2 (two) times daily. 01/17/23   Deirdre Evener, MD  atorvastatin (LIPITOR) 20 MG tablet TAKE 1 TABLET BY MOUTH ONCE  DAILY FOR CHOLESTEROL 06/30/22   [name], NP  Calcium-Magnesium-Vitamin D (CALCIUM 1200+D3 PO) Take 1 tablet by mouth daily. 10/05/22   [provider]  Cholecalciferol (DIALYVITE VITAMIN D 5000) 125 MCG (5000 UT) capsule Take 5,000 Units by mouth daily.    [provider]  colchicine 0.6 MG tablet Take 2 tablets at gout onset, then take 1 tablet 2 hours later, then take 1 tablet twice daily thereafter until flare resolves. Patient taking differently: Take 0.6 mg by mouth See admin instructions. Take 2 tablets at gout onset, then take 1 tablet 2 hours later, then take 1 tablet twice daily thereafter until flare resolves. As needed 12/28/22   [name], NP  cyanocobalamin 1000 MCG tablet Take 1,000 mcg by mouth daily. Vit B12    [provider]  DULoxetine (CYMBALTA) 60 MG capsule Take 1 capsule (60 mg total) by mouth daily. 12/20/22   Levert Feinstein, MD  famotidine (PEPCID) 20 MG tablet TAKE 1 TABLET BY MOUTH DAILY FOR HEARTBURN 09/21/22   [name], NP  FARXIGA 10 MG TABS tablet Take 10 mg by mouth every morning. 10/05/21   [provider]  ferrous sulfate 325 (65 FE) MG tablet Take 650 mg by mouth See admin instructions. Every three weeks for one week    [provider]  ibandronate (BONIVA) 150 MG tablet Take 1 tablet (150 mg total) by mouth every 30 (thirty) days. Take in the morning with a full glass of water, on an empty stomach, and do not take anything else by mouth or lie down for the next 30 min. 10/17/22   [name], NP  losartan (COZAAR) 100 MG tablet TAKE 1 TABLET BY MOUTH ONCE  DAILY FOR BLOOD PRESSURE 06/30/22   [name], NP  MAGnesium-Oxide 400 (240 Mg) MG tablet TAKE 1 TABLET(400 MG) BY MOUTH DAILY 03/28/22   Lanier Prude, MD  oxyCODONE (ROXICODONE) 5 MG immediate release tablet Take 1 tablet (5 mg total) by mouth every 4 (four) hours as needed for severe pain. 01/18/23   Porterfield, Amber, PA-C  tiZANidine (ZANAFLEX) 4 MG tablet Take 0.5-1 tablets (2-4 mg total) by mouth daily. 01/18/23 01/18/24  Porterfield, Amber, PA-C  triamcinolone cream (KENALOG) 0.1 % Apply 1 Application topically daily as needed. Up to 5 days per week. Avoid face, groin, underarms.  04/05/22   Deirdre Evener, MD     Allergies  Alendronate and Cefaclor   Family History   Family History  Problem Relation Age of Onset   Heart disease Mother    Breast cancer Neg Hx      Physical Exam  Triage Vital Signs: ED Triage Vitals  Enc Vitals Group     BP 02/08/23 0006 128/76     Pulse Rate 02/08/23 0006 88     Resp 02/08/23 0006 20     Temp 02/08/23 0006 98.6 F (37 C)     Temp Source 02/08/23 0141 Oral     SpO2 02/08/23 0006 98 %     Weight 02/07/23 2324 187 lb (84.8 kg)     Height 02/07/23 2324 5\' 3"  (1.6 m)     Head Circumference --      Peak Flow --      Pain Score 02/07/23 2324 8     Pain Loc --      Pain Edu? --      Excl. in GC? --     Updated Vital Signs: BP 130/72   Pulse 82   Temp 98.1 F (36.7 C) (Oral)   Resp 18   Ht 5\' 3"  (1.6 m)   Wt 84.8 kg   SpO2 96%   BMI 33.13 kg/m    General: Awake, no distress.  CV:  RRR.  Good peripheral perfusion.  Resp:  Normal effort.  CTAB. Abd:  Nontender.  No  distention.  Other:  BLE 1+ nonpitting edema.   ED Results / Procedures / Treatments  Labs (all labs ordered are listed, but only abnormal results are displayed) Labs Reviewed  CBC WITH DIFFERENTIAL/PLATELET - Abnormal; Notable for the following components:      Result Value   Hemoglobin 11.6 (*)    All other components within normal limits  COMPREHENSIVE METABOLIC PANEL - Abnormal; Notable for the following components:   Potassium 3.4 (*)    Glucose, Bld 120 (*)    Creatinine, Ser 1.22 (*)    Alkaline Phosphatase 161 (*)    Total Bilirubin 1.5 (*)    GFR, Estimated 46 (*)    All other components within normal limits  BRAIN NATRIURETIC PEPTIDE - Abnormal; Notable for the following components:   B Natriuretic Peptide 309.1 (*)    All other components within normal limits  TROPONIN I (HIGH SENSITIVITY) - Abnormal; Notable for the following components:   Troponin I (High Sensitivity) 36 (*)    All other components within normal limits  TROPONIN I (HIGH SENSITIVITY) - Abnormal; Notable for the following components:   Troponin I (High Sensitivity) 37 (*)    All other components within normal limits     EKG  ED ECG REPORT I, Tawan Degroote J, the attending physician, personally viewed and interpreted this ECG.   Date: 02/08/2023  EKG Time: 2326  Rate: 89  Rhythm: normal sinus rhythm  Axis: LAD  Intervals:none  ST&T Change: Nonspecific    RADIOLOGY I have independently visualized and interpreted patient's chest x-ray as well as noted the radiology interpretation:  X-ray: No acute cardiopulmonary process  Official radiology report(s): DG Chest 2 View  Result Date: 02/07/2023 CLINICAL DATA:  Chest pain EXAM: CHEST - 2 VIEW COMPARISON:  01/04/2023 FINDINGS: Heart and mediastinal contours are within normal limits. No focal opacities or effusions. No acute bony abnormality. Aortic atherosclerosis. Prior right shoulder replacement. IMPRESSION: No active cardiopulmonary disease.  Electronically Signed   By: Caryn Bee  Dover M.D.   On: 02/07/2023 23:49     PROCEDURES:  Critical Care performed: No  .1-3 Lead EKG Interpretation  Performed by: Irean Hong, MD Authorized by: Irean Hong, MD     Interpretation: normal     ECG rate:  85   ECG rate assessment: normal     Rhythm: sinus rhythm     Ectopy: none     Conduction: normal   Comments:     Patient placed on cardiac monitor to evaluate for arrhythmias    MEDICATIONS ORDERED IN ED: Medications  furosemide (LASIX) injection 20 mg (has no administration in time range)     IMPRESSION / MDM / ASSESSMENT AND PLAN / ED COURSE  I reviewed the triage vital signs and the nursing notes.                             77 year old female presenting with chest pain and shortness of breath. Differential diagnosis includes, but is not limited to, ACS, aortic dissection, pulmonary embolism, cardiac tamponade, pneumothorax, pneumonia, pericarditis, myocarditis, GI-related causes including esophagitis/gastritis, and musculoskeletal chest wall pain.   I am able to see a telephone communication with her cardiologist for her appointment at 11 AM this morning.  Unfortunately, I am unable to see her ED visit at [facility] from yesterday.  Patient's son does bring a copy of her paperwork.  I am unable to see her CT chest results, but from what the patient and her son described, it sounds like she had a CTA chest which was negative for PE.  Patient's presentation is most consistent with acute presentation with potential threat to life or bodily function.  The patient is on the cardiac monitor to evaluate for evidence of arrhythmia and/or significant heart rate changes.  Laboratory results demonstrate normal WBC 4.6, improved creatinine compared with 1 month ago, 2 sets of stable, minimally elevated troponins which are likely secondary to demand ischemia.  BNP slightly elevated.  Chest x-ray without evidence of pulmonary edema.   Will perform ambulation trial with pulse ox on room air, administer low-dose IV Lasix.  If ambulation trial is unremarkable, anticipate patient may be discharged home to follow-up with cardiology at 11 AM.  Clinical Course as of 02/09/23 0259  Thu Feb 08, 2023  2440 Patient perform successful ambulation trial.  She was not tachycardic, tachypneic nor hypoxic.  Have received IV Lasix and is starting to urinate.  Has an appointment with her cardiologist at 11 AM.  Strict return precautions given.  Patient verbalizes understanding and agrees with plan of care. [JS]    Clinical Course User Index [JS] Irean Hong, MD     FINAL CLINICAL IMPRESSION(S) / ED DIAGNOSES   Final diagnoses:  Chest pain, unspecified type  Congestive heart failure, unspecified HF chronicity, unspecified heart failure type [facility])     Rx / DC Orders   ED Discharge Orders     None        Note:  This document was prepared using Dragon voice recognition software and may include unintentional dictation errors.   Irean Hong, MD 02/09/23 [phone]

## 2023-02-08 NOTE — Discharge Instructions (Signed)
Please keep your cardiology appointment this morning.  Return to the ER for worsening symptoms, persistent vomiting, difficulty breathing or other concerns.

## 2023-02-09 ENCOUNTER — Telehealth: Payer: Self-pay

## 2023-02-09 NOTE — Transitions of Care (Post Inpatient/ED Visit) (Signed)
02/09/2023  Name: Amy Underwood MRN: [id number] DOB: [birth year]  Today's TOC FU Call Status: Today's TOC FU Call Status:: Successful TOC FU Call Competed TOC FU Call Complete Date: 02/09/23  Transition Care Management Follow-up Telephone Call Date of Discharge: 02/08/23 Discharge Facility: [facility]) Type of Discharge: Emergency Department Reason for ED Visit: Other:, Cardiac Conditions Cardiac Conditions Diagnosis: Chest Pain Persisting How have you been since you were released from the hospital?: Better Any questions or concerns?: No  Items Reviewed: Did you receive and understand the discharge instructions provided?: Yes Medications obtained,verified, and reconciled?: Yes (Medications Reviewed) Any new allergies since your discharge?: No Dietary orders reviewed?: No Do you have support at home?: Yes People in Home: child(ren), adult  Medications Reviewed Today: Medications Reviewed Today     Reviewed by Annabell Sabal, CMA (Certified Medical Assistant) on 02/09/23 at 1051  Med List Status: <None>   Medication Order Taking? Sig Documenting Provider Last Dose Status Informant  albuterol (VENTOLIN HFA) 108 (90 Base) MCG/ACT inhaler [id number] Yes Inhale 1-2 puffs into the lungs every 6 (six) hours as needed for wheezing or shortness of breath. [name], NP Taking Active Self  allopurinol (ZYLOPRIM) 100 MG tablet [id number] Yes Take 100 mg by mouth at bedtime. [provider] Taking Active Self  amLODipine (NORVASC) 10 MG tablet [id number] Yes Take 1 tablet (10 mg total) by mouth daily. Graciella Freer, PA-C Taking Active Self  Apremilast (OTEZLA) 30 MG TABS [id number] Yes Take 1 tablet (30 mg total) by mouth 2 (two) times daily. Deirdre Evener, MD Taking Active   atorvastatin (LIPITOR) 20 MG tablet [id number] Yes TAKE 1 TABLET BY MOUTH ONCE  DAILY FOR CHOLESTEROL [name], NP Taking Active Self   Calcium-Magnesium-Vitamin D (CALCIUM 1200+D3 PO) [id number] Yes Take 1 tablet by mouth daily. [provider] Taking Active Self  Cholecalciferol (DIALYVITE VITAMIN D 5000) 125 MCG (5000 UT) capsule [id number] Yes Take 5,000 Units by mouth daily. [provider] Taking Active Self  colchicine 0.6 MG tablet [id number] Yes Take 2 tablets at gout onset, then take 1 tablet 2 hours later, then take 1 tablet twice daily thereafter until flare resolves.  Patient taking differently: Take 0.6 mg by mouth See admin instructions. Take 2 tablets at gout onset, then take 1 tablet 2 hours later, then take 1 tablet twice daily thereafter until flare resolves. As needed   [name], NP Taking Active Self  cyanocobalamin 1000 MCG tablet [id number] Yes Take 1,000 mcg by mouth daily. Vit B12 [provider] Taking Active Self  DULoxetine (CYMBALTA) 60 MG capsule [id number] Yes Take 1 capsule (60 mg total) by mouth daily. Levert Feinstein, MD Taking Active Self  famotidine (PEPCID) 20 MG tablet [id number] Yes TAKE 1 TABLET BY MOUTH DAILY FOR Sima Matas, NP Taking Active Self  FARXIGA 10 MG TABS tablet [id number] Yes Take 10 mg by mouth every morning. [provider] Taking Active Self  ferrous sulfate 325 (65 FE) MG tablet [id number] Yes Take 650 mg by mouth See admin instructions. Every three weeks for one week [provider] Taking Active Self  ibandronate (BONIVA) 150 MG tablet [id number] Yes Take 1 tablet (150 mg total) by mouth every 30 (thirty) days. Take in the morning with a full glass of water, on an empty stomach, and do not take anything else by mouth or lie down for the next 30 min. [name], NP Taking Active Self  losartan (  COZAAR) 100 MG tablet [id number] Yes TAKE 1 TABLET BY MOUTH ONCE  DAILY FOR BLOOD PRESSURE [name], NP Taking Active Self  MAGnesium-Oxide 400 (240 Mg) MG tablet [id number] Yes TAKE 1 TABLET(400 MG) BY MOUTH DAILY  Lanier Prude, MD Taking Active Self  oxyCODONE (ROXICODONE) 5 MG immediate release tablet [id number] Yes Take 1 tablet (5 mg total) by mouth every 4 (four) hours as needed for severe pain. Porterfield, Hospital doctor, PA-C Taking Active   tiZANidine (ZANAFLEX) 4 MG tablet [id number] Yes Take 0.5-1 tablets (2-4 mg total) by mouth daily. [place], Hospital doctor, PA-C Taking Active   triamcinolone cream (KENALOG) 0.1 % [id number] Yes Apply 1 Application topically daily as needed. Up to 5 days per week. Avoid face, groin, underarms. Deirdre Evener, MD Taking Active Self            Home Care and Equipment/Supplies: Were Home Health Services Ordered?: No Any new equipment or medical supplies ordered?: No  Functional Questionnaire: Do you need assistance with bathing/showering or dressing?: No Do you need assistance with meal preparation?: No Do you need assistance with eating?: No Do you have difficulty maintaining continence: No Do you need assistance with getting out of bed/getting out of a chair/moving?: No Do you have difficulty managing or taking your medications?: No  Follow up appointments reviewed: PCP Follow-up appointment confirmed?: NA (patinet is going follow up w/ cardiologist and then call back to make an appointment with PCP) Specialist Hospital Follow-up appointment confirmed?: No Reason Specialist Follow-Up Not Confirmed: Patient has Specialist Provider Number and will Call for Appointment Do you need transportation to your follow-up appointment?: No Do you understand care options if your condition(s) worsen?: Yes-patient verbalized understanding    SIGNATURE Fredirick Maudlin

## 2023-02-09 NOTE — Telephone Encounter (Signed)
Noted  

## 2023-02-13 ENCOUNTER — Other Ambulatory Visit: Payer: Self-pay | Admitting: Primary Care

## 2023-02-13 DIAGNOSIS — I10 Essential (primary) hypertension: Secondary | ICD-10-CM

## 2023-02-13 DIAGNOSIS — E782 Mixed hyperlipidemia: Secondary | ICD-10-CM

## 2023-02-13 DIAGNOSIS — K219 Gastro-esophageal reflux disease without esophagitis: Secondary | ICD-10-CM

## 2023-03-01 ENCOUNTER — Encounter: Payer: Self-pay | Admitting: Cardiology

## 2023-03-01 ENCOUNTER — Ambulatory Visit: Payer: Medicare Other | Attending: Cardiology | Admitting: Cardiology

## 2023-03-01 VITALS — BP 142/80 | HR 87 | Ht 63.0 in | Wt 164.0 lb

## 2023-03-01 DIAGNOSIS — Z95818 Presence of other cardiac implants and grafts: Secondary | ICD-10-CM

## 2023-03-01 DIAGNOSIS — I5032 Chronic diastolic (congestive) heart failure: Secondary | ICD-10-CM | POA: Diagnosis not present

## 2023-03-01 DIAGNOSIS — R0789 Other chest pain: Secondary | ICD-10-CM

## 2023-03-01 DIAGNOSIS — I4819 Other persistent atrial fibrillation: Secondary | ICD-10-CM

## 2023-03-01 MED ORDER — FUROSEMIDE 20 MG PO TABS: 20.0000 mg | ORAL_TABLET | Freq: Every day | ORAL | 3 refills | Status: DC | PRN

## 2023-03-01 NOTE — Patient Instructions (Signed)
Medication Instructions:  Your physician has recommended you make the following change in your medication:  1) START taking Lasix (furosemide) 20 mg daily as needed for swelling  *If you need a refill on your cardiac medications before your next appointment, please call your pharmacy*  Follow-Up: At [facility], you and your health needs are our priority.  As part of our continuing mission to provide you with exceptional heart care, we have created designated Provider Care Teams.  These Care Teams include your primary Cardiologist (physician) and Advanced Practice Providers (APPs -  Physician Assistants and Nurse Practitioners) who all work together to provide you with the care you need, when you need it.  Your next appointment:   6 month(s)  Provider:   Sherie Don, NP

## 2023-03-01 NOTE — Progress Notes (Signed)
  Electrophysiology Office Follow up Visit Note:    Date:  03/01/2023   ID:  Amy Underwood, DOB 1945/10/24, MRN [id number]  PCP:  [name], NP  [facility] Cardiologist:  None  [facility] Electrophysiologist:  Lanier Prude, MD    Interval History:    Amy Underwood is a 77 y.o. female who presents for a follow up visit.   The patient has done well from an arrhythmia perspective since her made second 2023 catheter ablation of atrial fibrillation and flutter.  She was in the emergency department on February 08, 2023 with chest discomfort.  She was diagnosed with decompensated heart failure and diuresed with improvement in her symptoms.  It turns out she actually went on to develop influenza after this hospitalization was sick for the next few weeks.  She is now doing better without chest pain.       Past medical, surgical, social and family history were reviewed.  ROS:   Please see the history of present illness.    All other systems reviewed and are negative.  EKGs/Labs/Other Studies Reviewed:    The following studies were reviewed today:    EKG Interpretation Date/Time:  Thursday March 01 2023 14:27:01 EDT Ventricular Rate:  87 PR Interval:  90 QRS Duration:  72 QT Interval:  384 QTC Calculation: 462 R Axis:   -60  Text Interpretation: Unusual P axis, possible ectopic atrial rhythm Left axis deviation Confirmed by Steffanie Dunn [phone]) on 03/01/2023 2:54:17 PM    Physical Exam:    VS:  BP (!) 142/80   Pulse 87   Ht 5\' 3"  (1.6 m)   Wt 164 lb (74.4 kg)   SpO2 98%   BMI 29.05 kg/m     Wt Readings from Last 3 Encounters:  03/01/23 164 lb (74.4 kg)  02/07/23 187 lb (84.8 kg)  01/18/23 178 lb 9.2 oz (81 kg)     GEN:  Well nourished, well developed in no acute distress CARDIAC: RRR, no murmurs, rubs, gallops RESPIRATORY:  Clear to auscultation without rales, wheezing or rhonchi       ASSESSMENT:    1. Persistent atrial fibrillation (HCC)    2. Presence of Watchman left atrial appendage closure device   3. Chronic diastolic heart failure (HCC)   4. Chest discomfort    PLAN:    In order of problems listed above:  #Persistent atrial fibrillation and flutter #Watchman device in situ Patient is doing well after her catheter ablation in 2023.  No recurrence of her arrhythmia.  Her Watchman device is well-seated.  #Chronic diastolic heart failure #Chest pain Recent hospitalization for decompensated heart failure in the setting of influenza. I will give her prescription for Lasix 20 mg by mouth once daily as needed for swelling She will weigh yourself daily.  I discussed this in detail with the patient.  She will bring her weights to clinic for review at her next follow-up appointment.  I did tell her that if she needs more than 1 dose of diuretic, she is to contact the office for further instructions and lab work.    Follow-up 6 months with EP APP.     Signed, Steffanie Dunn, MD, [facility], [facility] 03/01/2023 8:46 PM    Electrophysiology [facility]

## 2023-03-07 ENCOUNTER — Encounter: Payer: Self-pay | Admitting: Primary Care

## 2023-03-07 ENCOUNTER — Ambulatory Visit (INDEPENDENT_AMBULATORY_CARE_PROVIDER_SITE_OTHER): Payer: Medicare Other | Admitting: Primary Care

## 2023-03-07 ENCOUNTER — Ambulatory Visit
Admission: RE | Admit: 2023-03-07 | Discharge: 2023-03-07 | Disposition: A | Discharge status: Home / Self Care | Payer: Medicare Other | Source: Ambulatory Visit | Attending: Primary Care | Admitting: Primary Care

## 2023-03-07 VITALS — BP 130/84 | HR 88 | Temp 97.5°F | Ht 63.0 in | Wt 162.0 lb

## 2023-03-07 DIAGNOSIS — R1011 Right upper quadrant pain: Secondary | ICD-10-CM | POA: Insufficient documentation

## 2023-03-07 DIAGNOSIS — R197 Diarrhea, unspecified: Secondary | ICD-10-CM | POA: Diagnosis not present

## 2023-03-07 DIAGNOSIS — R1010 Upper abdominal pain, unspecified: Secondary | ICD-10-CM

## 2023-03-07 LAB — CBC WITH DIFFERENTIAL/PLATELET
Basophils Absolute: 0 10*3/uL (ref 0.0–0.1)
Basophils Relative: 0.7 % (ref 0.0–3.0)
Eosinophils Absolute: 0.1 10*3/uL (ref 0.0–0.7)
Eosinophils Relative: 1.8 % (ref 0.0–5.0)
HCT: 40.9 % (ref 36.0–46.0)
Hemoglobin: 13.2 g/dL (ref 12.0–15.0)
Lymphocytes Relative: 19.7 % (ref 12.0–46.0)
Lymphs Abs: 1.1 10*3/uL (ref 0.7–4.0)
MCHC: 32.3 g/dL (ref 30.0–36.0)
MCV: 87.5 fl (ref 78.0–100.0)
Monocytes Absolute: 0.4 10*3/uL (ref 0.1–1.0)
Monocytes Relative: 7.7 % (ref 3.0–12.0)
Neutro Abs: 4 10*3/uL (ref 1.4–7.7)
Neutrophils Relative %: 70.1 % (ref 43.0–77.0)
Platelets: 197 10*3/uL (ref 150.0–400.0)
RBC: 4.67 Mil/uL (ref 3.87–5.11)
RDW: 17 % — ABNORMAL HIGH (ref 11.5–15.5)
WBC: 5.7 10*3/uL (ref 4.0–10.5)

## 2023-03-07 LAB — BASIC METABOLIC PANEL
BUN: 30 mg/dL — ABNORMAL HIGH (ref 6–23)
CO2: 29 mEq/L (ref 19–32)
Calcium: 10.2 mg/dL (ref 8.4–10.5)
Chloride: 102 mEq/L (ref 96–112)
Creatinine, Ser: 1.47 mg/dL — ABNORMAL HIGH (ref 0.40–1.20)
GFR: 34.71 mL/min — ABNORMAL LOW (ref >60.00)
Glucose, Bld: 109 mg/dL — ABNORMAL HIGH (ref 70–99)
Potassium: 3.8 mEq/L (ref 3.5–5.1)
Sodium: 140 mEq/L (ref 135–145)

## 2023-03-07 LAB — LIPASE: Lipase: 45 U/L (ref 11.0–59.0)

## 2023-03-07 MED ORDER — IOHEXOL 300 MG/ML  SOLN
80.0000 mL | Freq: Once | INTRAMUSCULAR | Status: AC | PRN
  Administered 2023-03-07: 80 mL via INTRAVENOUS

## 2023-03-07 MED ORDER — ONDANSETRON 4 MG PO TBDP
4.0000 mg | ORAL_TABLET | Freq: Three times a day (TID) | ORAL | 0 refills | Status: DC | PRN
Start: 2023-03-07 — End: 2023-04-03

## 2023-03-07 NOTE — Patient Instructions (Signed)
Stop by the lab prior to leaving today. I will notify you of your results once received.   We will complete your CT scan today.  You may take the ondansetron (Zofran) antinausea medication every 8 hours as needed.  Try to stay hydrated with water and electrolytes.  Do not eat or drink anything until you speak with the scheduler for the CT scan.  It was a pleasure to see you today!

## 2023-03-07 NOTE — Assessment & Plan Note (Signed)
Unclear etiology, diffusely tender on exam. So far appears stable for outpatient treatment.  Stat CT abdomen/pelvis ordered and pending.  Labs pending today for CBC with differential, BMP, H. pylori breath testing, stool studies, lipase.  ED precautions provided.

## 2023-03-07 NOTE — Assessment & Plan Note (Signed)
Unclear etiology at this point.  CBC with differential and BMP ordered and pending. Stool studies ordered and pending.  We discussed the absolute need to work on better hydration. Prescription for Zofran 4 mg ODT tablets provided to use every 8 hours as needed.  Obtaining stat CT abdomen pelvis for further evaluation.

## 2023-03-07 NOTE — Progress Notes (Signed)
Subjective:    Patient ID: Amy Underwood, female    DOB: Jan 01, 1946, 77 y.o.   MRN: [id number]  Diarrhea  Associated symptoms include abdominal pain. Pertinent negatives include no fever or vomiting.    Amy Underwood is a very pleasant 77 y.o. female with a history of hypertension, paroxysmal atrial fibrillation, asthma, CKD, iron deficiency anemia, gastric bypass, cholecystectomy,  gout who presents today to discuss diarrhea.  Symptom onset 9 days ago of recurrent diarrhea, occurring 3 times daily and epigastric abdominal pain. Two days later she began taking Imodium AD regularly, continues to experience watery, liquid diarrhea 2-3 times daily. Symptoms occur mostly with eating.   About three days ago she began to experience nausea without vomiting anytime she eats or drinks anything. She is trying to stay hydrated with water and is drinking 24 oz of water daily. Her abdominal pain is constant, but increased with eating and drinking.   She has lost 12 pounds in 12 days according to her scales as she's not eating much. She is compliant to famotidine 20 mg daily which hasn't helped. She chewed Pepto Bismol tablets one week ago which provided temporary improvement.   About 17 years ago she was rushed to the OR for abdominal pain and was told that her intestines are wrapped around her bowels.  She did not have any part of her colon removed.  Her pain now feels similar.  Wt Readings from Last 3 Encounters:  03/07/23 162 lb (73.5 kg)  03/01/23 164 lb (74.4 kg)  02/07/23 187 lb (84.8 kg)     BP Readings from Last 3 Encounters:  03/07/23 130/84  03/01/23 (!) 142/80  02/08/23 (!) 164/82      Review of Systems  Constitutional:  Positive for fatigue. Negative for fever.  Gastrointestinal:  Positive for abdominal pain, diarrhea and nausea. Negative for blood in stool and vomiting.  Neurological:  Positive for dizziness and weakness.         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 Lalla Brothers   Psoriasis     Social History   Socioeconomic History   Marital status: Widowed    Spouse name: Not on file   Number of children: Not on file   Years of education: Not on file   Highest education level: 12th grade  Occupational History   Not on file  Tobacco Use   Smoking status: Former    Packs/day: 2.00    Years: 25.00    Additional pack years: 0.00    Total pack years: 50.00    Types: Cigarettes    Start date: 2001   Smokeless tobacco: Former  Building services engineer Use: Never used  Substance and Sexual Activity   Alcohol use: Never   Drug use: Never   Sexual activity: Not Currently  Other Topics Concern   Not on file  Social History Narrative   Not on file   Social Determinants of Health   Financial Resource Strain: Low Risk  (  12/22/2022)   Overall Financial Resource Strain (CARDIA)    Difficulty of Paying Living Expenses: Not hard at all  Food Insecurity: No Food Insecurity (12/22/2022)   Hunger Vital Sign    Worried About Running Out of Food in the Last Year: Never true    Ran Out of Food in the Last Year: Never true  Transportation Needs: No Transportation Needs (12/22/2022)   PRAPARE - Administrator, Civil Service (Medical): No    Lack of Transportation (Non-Medical): No  Physical Activity: Insufficiently Active (12/22/2022)   Exercise Vital Sign    Days of Exercise per Week: 3 days    Minutes of Exercise per Session: 10 min  Stress: Stress Concern Present (12/22/2022)   [employer] of Occupational Health - Occupational Stress Questionnaire    Feeling of Stress : To some extent  Social Connections: Socially Isolated (12/22/2022)   Social Connection and Isolation Panel [NHANES]    Frequency of Communication with Friends and Family: More than three times a week    Frequency of Social Gatherings with Friends and Family: Three times a week    Attends Religious Services: Never    Active Member of Clubs or Organizations: No    Attends Banker Meetings: Never    Marital Status: Widowed  Intimate Partner Violence: Not At Risk (04/03/2022)   Humiliation, Afraid, Rape, and Kick questionnaire    Fear of Current or Ex-Partner: No    Emotionally Abused: No    Physically Abused: No    Sexually Abused: No    Past Surgical History:  Procedure Laterality Date   ATRIAL FIBRILLATION ABLATION N/A 01/03/2022   Procedure: ATRIAL FIBRILLATION ABLATION;  Surgeon: Lanier Prude, MD;  Location: [facility];  Service: Cardiovascular;  Laterality: N/A;   CARPAL TUNNEL RELEASE Right 01/18/2023   Procedure: CARPAL TUNNEL RELEASE;  Surgeon: Jones Broom, MD;  Location: [facility];  Service: Orthopedics;  Laterality: Right;   CESAREAN SECTION     3   CHOLECYSTECTOMY     HIP ARTHROPLASTY Left    KNEE ARTHROPLASTY Bilateral    LEFT ATRIAL APPENDAGE OCCLUSION N/A 04/20/2022   Procedure: LEFT ATRIAL APPENDAGE OCCLUSION;  Surgeon: Lanier Prude, MD;  Location: [facility];  Service: Cardiovascular;  Laterality: N/A;   REVERSE SHOULDER ARTHROPLASTY Right 01/18/2023   Procedure: REVERSE SHOULDER ARTHROPLASTY;  Surgeon: Jones Broom, MD;  Location: [facility];  Service: Orthopedics;  Laterality: Right;   TEE WITHOUT CARDIOVERSION N/A 04/20/2022   Procedure: TRANSESOPHAGEAL ECHOCARDIOGRAM (TEE);  Surgeon: Lanier Prude, MD;  Location: [facility] CV [facility];  Service: Cardiovascular;  Laterality: N/A;    Family History  Problem Relation Age of Onset   Heart  disease Mother    Breast cancer Neg Hx     Allergies  Allergen Reactions   Alendronate Palpitations   Cefaclor Hives and Rash    Current Outpatient Medications on File Prior to Visit  Medication Sig Dispense Refill   albuterol (VENTOLIN HFA) 108 (90 Base) MCG/ACT inhaler Inhale 1-2 puffs into the lungs every 6 (six) hours as needed for wheezing or shortness of breath. 6.7 g 0   allopurinol (ZYLOPRIM) 100 MG tablet Take 100 mg by mouth at bedtime.     amLODipine (NORVASC) 10 MG tablet Take 1 tablet (10 mg total) by mouth daily. 180 tablet 3   Apremilast (OTEZLA) 30 MG TABS Take 1 tablet (30 mg total) by mouth 2 (two) times daily. 180 tablet 3  atorvastatin (LIPITOR) 20 MG tablet TAKE 1 TABLET BY MOUTH ONCE  DAILY FOR CHOLESTEROL 90 tablet 2   Calcium-Magnesium-Vitamin D (CALCIUM 1200+D3 PO) Take 1 tablet by mouth daily.     Cholecalciferol (DIALYVITE VITAMIN D 5000) 125 MCG (5000 UT) capsule Take 5,000 Units by mouth daily.     colchicine 0.6 MG tablet Take 2 tablets at gout onset, then take 1 tablet 2 hours later, then take 1 tablet twice daily thereafter until flare resolves. 60 tablet 0   cyanocobalamin 1000 MCG tablet Take 1,000 mcg by mouth daily. Vit B12     DULoxetine (CYMBALTA) 60 MG capsule Take 1 capsule (60 mg total) by mouth daily. 90 capsule 3   famotidine (PEPCID) 20 MG tablet TAKE 1 TABLET BY MOUTH DAILY FOR HEARTBURN 90 tablet 1   FARXIGA 10 MG TABS tablet Take 10 mg by mouth every morning.     furosemide (LASIX) 20 MG tablet Take 1 tablet (20 mg total) by mouth daily as needed. 30 tablet 3   ibandronate (BONIVA) 150 MG tablet Take 1 tablet (150 mg total) by mouth every 30 (thirty) days. Take in the morning with a full glass of water, on an empty stomach, and do not take anything else by mouth or lie down for the next 30 min. 3 tablet 3   losartan (COZAAR) 100 MG tablet TAKE 1 TABLET BY MOUTH ONCE  DAILY FOR BLOOD PRESSURE 90 tablet 2   MAGnesium-Oxide 400 (240 Mg) MG  tablet TAKE 1 TABLET(400 MG) BY MOUTH DAILY 90 tablet 3   tiZANidine (ZANAFLEX) 4 MG tablet Take 0.5-1 tablets (2-4 mg total) by mouth daily. 20 tablet 0   triamcinolone cream (KENALOG) 0.1 % Apply 1 Application topically daily as needed. Up to 5 days per week. Avoid face, groin, underarms. 80 g 3   ferrous sulfate 325 (65 FE) MG tablet Take 650 mg by mouth See admin instructions. Every three weeks for one week (Patient not taking: Reported on 03/07/2023)     oxyCODONE (ROXICODONE) 5 MG immediate release tablet Take 1 tablet (5 mg total) by mouth every 4 (four) hours as needed for severe pain. (Patient not taking: Reported on 03/07/2023) 30 tablet 0   No current facility-administered medications on file prior to visit.    BP 130/84   Pulse 88   Temp (!) 97.5 F (36.4 C) (Temporal)   Ht 5\' 3"  (1.6 m)   Wt 162 lb (73.5 kg)   SpO2 98%   BMI 28.70 kg/m  Objective:   Physical Exam Constitutional:      Appearance: She is not ill-appearing.  Cardiovascular:     Rate and Rhythm: Normal rate and regular rhythm.  Pulmonary:     Effort: Pulmonary effort is normal.     Breath sounds: Normal breath sounds.  Abdominal:     Tenderness: There is abdominal tenderness in the right upper quadrant, epigastric area, periumbilical area and left upper quadrant.  Musculoskeletal:     Cervical back: Neck supple.  Skin:    General: Skin is warm and dry.           Assessment & Plan:  Acute diarrhea Assessment & Plan: Unclear etiology at this point.  CBC with differential and BMP ordered and pending. Stool studies ordered and pending.  We discussed the absolute need to work on better hydration. Prescription for Zofran 4 mg ODT tablets provided to use every 8 hours as needed.  Obtaining stat CT abdomen pelvis for further evaluation.  Orders: -     CBC with Differential/Platelet -     Giardia antigen -     Gastrointestinal Pathogen Pnl RT, PCR -     C. difficile GDH and Toxin A/B -     Basic  metabolic panel -     Ondansetron; Take 1 tablet (4 mg total) by mouth every 8 (eight) hours as needed for nausea or vomiting.  Dispense: 20 tablet; Refill: 0  Pain of upper abdomen Assessment & Plan: Unclear etiology, diffusely tender on exam. So far appears stable for outpatient treatment.  Stat CT abdomen/pelvis ordered and pending.  Labs pending today for CBC with differential, BMP, H. pylori breath testing, stool studies, lipase.  ED precautions provided.  Orders: -     H. pylori breath test -     CT ABDOMEN PELVIS W CONTRAST; Future -     Lipase -     Ondansetron; Take 1 tablet (4 mg total) by mouth every 8 (eight) hours as needed for nausea or vomiting.  Dispense: 20 tablet; Refill: 0  Diarrhea of presumed infectious origin -     CT ABDOMEN PELVIS W CONTRAST; Future        [name], NP

## 2023-03-07 NOTE — Addendum Note (Signed)
Addended by: Alvina Chou on: 03/07/2023 10:25 AM   Modules accepted: Orders

## 2023-03-09 ENCOUNTER — Other Ambulatory Visit: Payer: Self-pay | Admitting: Radiology

## 2023-03-09 DIAGNOSIS — R197 Diarrhea, unspecified: Secondary | ICD-10-CM

## 2023-03-09 LAB — H. PYLORI BREATH TEST: H. pylori Breath Test: NOT DETECTED

## 2023-03-12 ENCOUNTER — Other Ambulatory Visit: Payer: Self-pay

## 2023-03-12 DIAGNOSIS — Q244 Congenital subaortic stenosis: Secondary | ICD-10-CM

## 2023-03-12 LAB — C. DIFFICILE GDH AND TOXIN A/B
GDH ANTIGEN: NOT DETECTED
MICRO NUMBER:: 15164847
SPECIMEN QUALITY:: ADEQUATE
TOXIN A AND B: NOT DETECTED

## 2023-03-12 NOTE — Progress Notes (Signed)
Referral to Dr. Kirke Corin per Dr. Lalla Brothers based on CT results.

## 2023-03-13 LAB — GIARDIA ANTIGEN
MICRO NUMBER:: 15164818
RESULT:: NOT DETECTED
SPECIMEN QUALITY:: ADEQUATE

## 2023-03-13 LAB — GASTROINTESTINAL PATHOGEN PNL

## 2023-03-14 ENCOUNTER — Other Ambulatory Visit: Payer: Self-pay | Admitting: Primary Care

## 2023-03-14 DIAGNOSIS — K219 Gastro-esophageal reflux disease without esophagitis: Secondary | ICD-10-CM

## 2023-03-14 MED ORDER — FAMOTIDINE 40 MG PO TABS
40.0000 mg | ORAL_TABLET | Freq: Every evening | ORAL | 0 refills | Status: DC
Start: 2023-03-14 — End: 2023-06-11

## 2023-04-03 ENCOUNTER — Ambulatory Visit
Admission: RE | Admit: 2023-04-03 | Discharge: 2023-04-03 | Disposition: A | Discharge status: Home / Self Care | Payer: Medicare Other | Source: Ambulatory Visit | Attending: Primary Care | Admitting: Primary Care

## 2023-04-03 ENCOUNTER — Other Ambulatory Visit: Payer: Self-pay | Admitting: Primary Care

## 2023-04-03 ENCOUNTER — Encounter: Payer: Self-pay | Admitting: Primary Care

## 2023-04-03 ENCOUNTER — Ambulatory Visit (INDEPENDENT_AMBULATORY_CARE_PROVIDER_SITE_OTHER): Payer: Medicare Other | Admitting: Primary Care

## 2023-04-03 ENCOUNTER — Encounter: Payer: Self-pay | Admitting: Gastroenterology

## 2023-04-03 VITALS — BP 102/60 | HR 115 | Temp 97.6°F | Ht 63.0 in | Wt 161.0 lb

## 2023-04-03 DIAGNOSIS — R1011 Right upper quadrant pain: Secondary | ICD-10-CM

## 2023-04-03 DIAGNOSIS — R110 Nausea: Secondary | ICD-10-CM | POA: Diagnosis not present

## 2023-04-03 DIAGNOSIS — K838 Other specified diseases of biliary tract: Secondary | ICD-10-CM

## 2023-04-03 LAB — COMPREHENSIVE METABOLIC PANEL
ALT: 14 U/L (ref 0–35)
AST: 22 U/L (ref 0–37)
Albumin: 4.1 g/dL (ref 3.5–5.2)
Alkaline Phosphatase: 98 U/L (ref 39–117)
BUN: 34 mg/dL — ABNORMAL HIGH (ref 6–23)
CO2: 28 mEq/L (ref 19–32)
Calcium: 10.1 mg/dL (ref 8.4–10.5)
Chloride: 103 mEq/L (ref 96–112)
Creatinine, Ser: 1.48 mg/dL — ABNORMAL HIGH (ref 0.40–1.20)
GFR: 34.41 mL/min — ABNORMAL LOW (ref >60.00)
Glucose, Bld: 176 mg/dL — ABNORMAL HIGH (ref 70–99)
Potassium: 4.1 mEq/L (ref 3.5–5.1)
Sodium: 141 mEq/L (ref 135–145)
Total Bilirubin: 0.8 mg/dL (ref 0.2–1.2)
Total Protein: 6.7 g/dL (ref 6.0–8.3)

## 2023-04-03 LAB — CBC WITH DIFFERENTIAL/PLATELET
Basophils Absolute: 0 10*3/uL (ref 0.0–0.1)
Basophils Relative: 0.7 % (ref 0.0–3.0)
Eosinophils Absolute: 0.2 10*3/uL (ref 0.0–0.7)
Eosinophils Relative: 3.4 % (ref 0.0–5.0)
HCT: 41 % (ref 36.0–46.0)
Hemoglobin: 13 g/dL (ref 12.0–15.0)
Lymphocytes Relative: 22.6 % (ref 12.0–46.0)
Lymphs Abs: 1.3 10*3/uL (ref 0.7–4.0)
MCHC: 31.6 g/dL (ref 30.0–36.0)
MCV: 90.9 fl (ref 78.0–100.0)
Monocytes Absolute: 0.4 10*3/uL (ref 0.1–1.0)
Monocytes Relative: 7.1 % (ref 3.0–12.0)
Neutro Abs: 3.9 10*3/uL (ref 1.4–7.7)
Neutrophils Relative %: 66.2 % (ref 43.0–77.0)
Platelets: 222 10*3/uL (ref 150.0–400.0)
RBC: 4.51 Mil/uL (ref 3.87–5.11)
RDW: 18.1 % — ABNORMAL HIGH (ref 11.5–15.5)
WBC: 5.9 10*3/uL (ref 4.0–10.5)

## 2023-04-03 MED ORDER — GADOBUTROL 1 MMOL/ML IV SOLN
7.0000 mL | Freq: Once | INTRAVENOUS | Status: AC | PRN
  Administered 2023-04-03: 7 mL via INTRAVENOUS

## 2023-04-03 MED ORDER — ONDANSETRON 4 MG PO TBDP
4.0000 mg | ORAL_TABLET | Freq: Three times a day (TID) | ORAL | 0 refills | Status: DC | PRN
Start: 2023-04-03 — End: 2023-07-12

## 2023-04-03 NOTE — Progress Notes (Signed)
Subjective:    Patient ID: Amy Underwood, female    DOB: 1945/12/10, 77 y.o.   MRN: [id number]  HPI  Amy Underwood is a very pleasant 77 y.o. female who presents today   She was last evaluated on 03/07/2023 for a 9-day history of recurrent diarrhea, epigastric abdominal pain.  Symptoms occurred mostly with eating.  Several days later she began to experience nausea without vomiting anytime she eats or drinks, had lost 12 pounds in 12 days according to her home scales.    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  Labs were grossly negative, including stool studies.  She was treated with Zofran.  Symptoms improved.  Today she continues to experience nausea within 30 minutes of taking medication, drinking water, or eating. She will "break out into a cold sweat". If she stands up too quickly she will notice RUQ abdominal pain for which she describes as sharp and intense. She will sit back down and her symptoms will improve. She had a ventral hernia repair with mesh about 8-9 years ago. She questions if there is a problem with her mesh.  Zofran has helped with symptoms to where she's able to eat and drink, but she ran out of this medication. She denies fevers, vomiting, diarrhea.  Her last bowel movement was this morning.  She is passing gas.  Wt Readings from Last 3 Encounters:  04/03/23 161 lb (73 kg)  03/07/23 162 lb (73.5 kg)  03/01/23 164 lb (74.4 kg)   BP Readings from Last 3 Encounters:  04/03/23 102/60  03/07/23 130/84  03/01/23 (!) 142/80      Review of Systems  Constitutional:  Positive for fatigue. Negative for fever.  Respiratory:  Negative for shortness of breath.   Cardiovascular:  Negative for chest pain.  Gastrointestinal:  Positive for abdominal pain and nausea.  Negative for constipation, diarrhea and vomiting.         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 Lalla Brothers   Psoriasis     Social History   Socioeconomic History   Marital status: Widowed    Spouse name: Not on file   Number of children: Not on file   Years of education: Not on file   Highest education level: 12th grade  Occupational History   Not on file  Tobacco Use   Smoking status: Former    Current packs/day: 2.00    Average packs/day: 2.0 packs/day for 25.0 years (50.0 ttl pk-yrs)    Types: Cigarettes    Start date: 2001   Smokeless tobacco: Former  Building services engineer status: Never Used  Substance and Sexual Activity   Alcohol use: Never   Drug use: Never   Sexual activity: Not Currently  Other Topics Concern   Not on file  Social History Narrative   Not on  file   Social Determinants of Health   Financial Resource Strain: Low Risk  (12/22/2022)   Overall Financial Resource Strain (CARDIA)    Difficulty of Paying Living Expenses: Not hard at all  Food Insecurity: No Food Insecurity (12/22/2022)   Hunger Vital Sign    Worried About Running Out of Food in the Last Year: Never true    Ran Out of Food in the Last Year: Never true  Transportation Needs: No Transportation Needs (12/22/2022)   PRAPARE - Administrator, Civil Service (Medical): No    Lack of Transportation (Non-Medical): No  Physical Activity: Insufficiently Active (12/22/2022)   Exercise Vital Sign    Days of Exercise per  Week: 3 days    Minutes of Exercise per Session: 10 min  Stress: Stress Concern Present (12/22/2022)   [employer] of Occupational Health - Occupational Stress Questionnaire    Feeling of Stress : To some extent  Social Connections: Socially Isolated (12/22/2022)   Social Connection and Isolation Panel [NHANES]    Frequency of Communication with Friends and Family: More than three times a week    Frequency of Social Gatherings with Friends and Family: Three times a week    Attends Religious Services: Never    Active Member of Clubs or Organizations: No    Attends Banker Meetings: Never    Marital Status: Widowed  Intimate Partner Violence: Not At Risk (04/03/2022)   Humiliation, Afraid, Rape, and Kick questionnaire    Fear of Current or Ex-Partner: No    Emotionally Abused: No    Physically Abused: No    Sexually Abused: No    Past Surgical History:  Procedure Laterality Date   ATRIAL FIBRILLATION ABLATION N/A 01/03/2022   Procedure: ATRIAL FIBRILLATION ABLATION;  Surgeon: Lanier Prude, MD;  Location: [facility];  Service: Cardiovascular;  Laterality: N/A;   CARPAL TUNNEL RELEASE Right 01/18/2023   Procedure: CARPAL TUNNEL RELEASE;  Surgeon: Jones Broom, MD;  Location: [facility];  Service: Orthopedics;  Laterality: Right;   CESAREAN SECTION     3   CHOLECYSTECTOMY     HIP ARTHROPLASTY Left    KNEE ARTHROPLASTY Bilateral    LEFT ATRIAL APPENDAGE OCCLUSION N/A 04/20/2022   Procedure: LEFT ATRIAL APPENDAGE OCCLUSION;  Surgeon: Lanier Prude, MD;  Location: [facility];  Service: Cardiovascular;  Laterality: N/A;   REVERSE SHOULDER ARTHROPLASTY Right 01/18/2023   Procedure: REVERSE SHOULDER ARTHROPLASTY;  Surgeon: Jones Broom, MD;  Location: [facility];  Service: Orthopedics;  Laterality: Right;   TEE WITHOUT CARDIOVERSION N/A 04/20/2022   Procedure: TRANSESOPHAGEAL ECHOCARDIOGRAM (TEE);  Surgeon: Lanier Prude, MD;  Location: [facility]VASIVE  CV LAB;  Service: Cardiovascular;  Laterality: N/A;    Family History  Problem Relation Age of Onset   Heart disease Mother    Breast cancer Neg Hx     Allergies  Allergen Reactions   Alendronate Palpitations   Cefaclor Hives and Rash    Current Outpatient Medications on File Prior to Visit  Medication Sig Dispense Refill   albuterol (VENTOLIN HFA) 108 (90 Base) MCG/ACT inhaler Inhale 1-2 puffs into the lungs every 6 (six) hours as needed for wheezing or shortness of breath. 6.7 g 0   allopurinol (ZYLOPRIM) 100 MG tablet Take 100 mg by mouth at bedtime.     amLODipine (NORVASC) 10 MG tablet Take 1 tablet (10 mg total) by mouth daily. 180 tablet 3   Apremilast (OTEZLA) 30 MG TABS  Take 1 tablet (30 mg total) by mouth 2 (two) times daily. 180 tablet 3   atorvastatin (LIPITOR) 20 MG tablet TAKE 1 TABLET BY MOUTH ONCE  DAILY FOR CHOLESTEROL 90 tablet 2   Calcium-Magnesium-Vitamin D (CALCIUM 1200+D3 PO) Take 1 tablet by mouth daily.     Cholecalciferol (DIALYVITE VITAMIN D 5000) 125 MCG (5000 UT) capsule Take 5,000 Units by mouth daily.     cyanocobalamin 1000 MCG tablet Take 1,000 mcg by mouth daily. Vit B12     DULoxetine (CYMBALTA) 60 MG capsule Take 1 capsule (60 mg total) by mouth daily. 90 capsule 3   famotidine (PEPCID) 40 MG tablet Take 1 tablet (40 mg total) by mouth every evening. for heartburn. 90 tablet 0   FARXIGA 10 MG TABS tablet Take 10 mg by mouth every morning.     ferrous sulfate 325 (65 FE) MG tablet Take 650 mg by mouth See admin instructions. Every three weeks for one week     furosemide (LASIX) 20 MG tablet Take 1 tablet (20 mg total) by mouth daily as needed. 30 tablet 3   ibandronate (BONIVA) 150 MG tablet Take 1 tablet (150 mg total) by mouth every 30 (thirty) days. Take in the morning with a full glass of water, on an empty stomach, and do not take anything else by mouth or lie down for the next 30 min. 3 tablet 3   losartan (COZAAR) 100 MG tablet TAKE 1 TABLET BY  MOUTH ONCE  DAILY FOR BLOOD PRESSURE 90 tablet 2   MAGnesium-Oxide 400 (240 Mg) MG tablet TAKE 1 TABLET(400 MG) BY MOUTH DAILY 90 tablet 3   oxyCODONE (ROXICODONE) 5 MG immediate release tablet Take 1 tablet (5 mg total) by mouth every 4 (four) hours as needed for severe pain. 30 tablet 0   tiZANidine (ZANAFLEX) 4 MG tablet Take 0.5-1 tablets (2-4 mg total) by mouth daily. 20 tablet 0   triamcinolone cream (KENALOG) 0.1 % Apply 1 Application topically daily as needed. Up to 5 days per week. Avoid face, groin, underarms. 80 g 3   No current facility-administered medications on file prior to visit.    BP 102/60   Pulse (!) 115   Temp 97.6 F (36.4 C) (Temporal)   Ht 5\' 3"  (1.6 m)   Wt 161 lb (73 kg)   SpO2 98%   BMI 28.52 kg/m  Objective:   Physical Exam Constitutional:      General: She is not in acute distress.    Appearance: She is not ill-appearing.  Cardiovascular:     Rate and Rhythm: Tachycardia present.  Pulmonary:     Effort: Pulmonary effort is normal.  Abdominal:     General: Bowel sounds are normal.     Palpations: Abdomen is soft.     Tenderness: There is abdominal tenderness in the right upper quadrant and epigastric area.           Assessment & Plan:  RUQ abdominal pain Assessment & Plan: Continued and progressing.  Reviewed CT scan of abdomen pelvis with patient.  Given location of pain, coupled with symptoms, we will proceed with MR abdomen MRCP. Refills provided for Zofran to use as needed.  CBC and CMP ordered and pending.  Hospital precautions provided.  Orders: -     MR ABDOMEN MRCP W WO CONTAST; Future -     CBC with Differential/Platelet -     Comprehensive metabolic panel  Nausea -     Ondansetron; Take 1 tablet (  4 mg total) by mouth every 8 (eight) hours as needed for nausea or vomiting.  Dispense: 20 tablet; Refill: 0  Dilation of biliary tract -     MR ABDOMEN MRCP W WO CONTAST; Future        [name], NP

## 2023-04-03 NOTE — Assessment & Plan Note (Signed)
Continued and progressing.  Reviewed CT scan of abdomen pelvis with patient.  Given location of pain, coupled with symptoms, we will proceed with MR abdomen MRCP. Refills provided for Zofran to use as needed.  CBC and CMP ordered and pending.  Hospital precautions provided.

## 2023-04-03 NOTE — Patient Instructions (Signed)
Stop by the lab prior to leaving today. I will notify you of your results once received.   You receive a phone call today regarding the MRI of your abdomen.  Take the Zofran every 8 hours as needed for nausea.  It was a pleasure to see you today!

## 2023-04-04 ENCOUNTER — Ambulatory Visit: Payer: Medicare Other | Admitting: Cardiology

## 2023-04-04 ENCOUNTER — Ambulatory Visit: Payer: Medicare Other | Admitting: Dermatology

## 2023-04-07 ENCOUNTER — Other Ambulatory Visit: Payer: Self-pay | Admitting: Cardiology

## 2023-04-09 ENCOUNTER — Other Ambulatory Visit: Payer: Self-pay

## 2023-04-09 MED ORDER — MAGNESIUM OXIDE -MG SUPPLEMENT 400 (240 MG) MG PO TABS: 400.0000 mg | ORAL_TABLET | Freq: Every day | ORAL | 0 refills | Status: DC

## 2023-04-09 NOTE — Telephone Encounter (Signed)
Please advise if ok to continue refills for magnesium.

## 2023-04-22 IMAGING — US US RENAL
1 series · 14 of 25 positions shown · non-contrast
Comparison: None.

CLINICAL DATA: Chronic kidney disease stage 4

EXAM:
RENAL / URINARY TRACT ULTRASOUND COMPLETE

[Series 1: us renal · 14 of 34 slices shown]
[im 1/34]
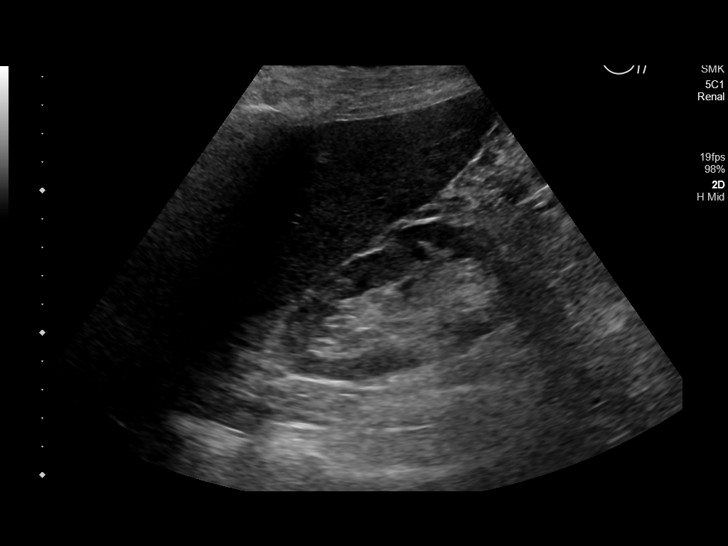
[im 3/34]
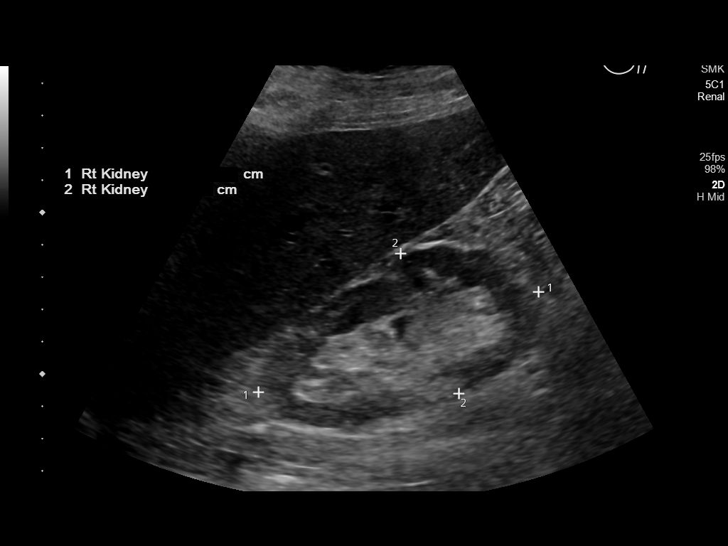
[im 6/34]
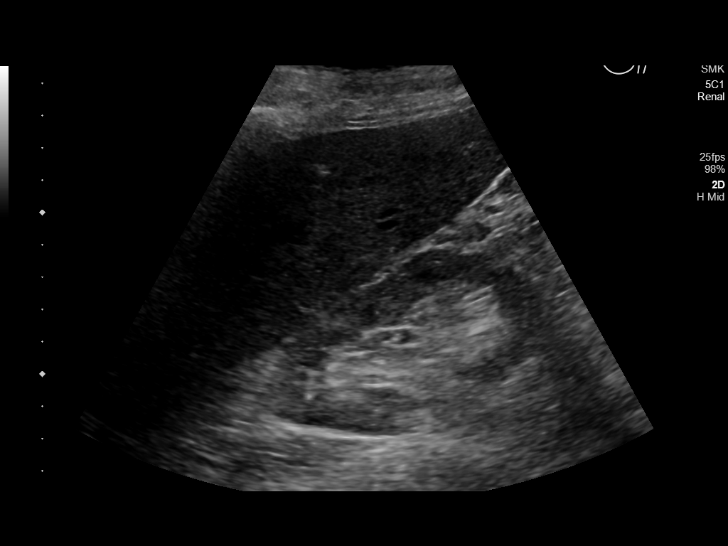
[im 9/34]
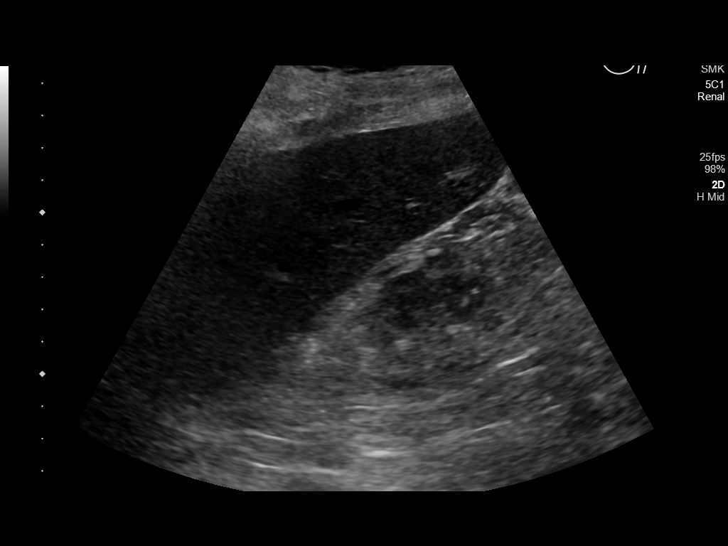
[im 12/34]
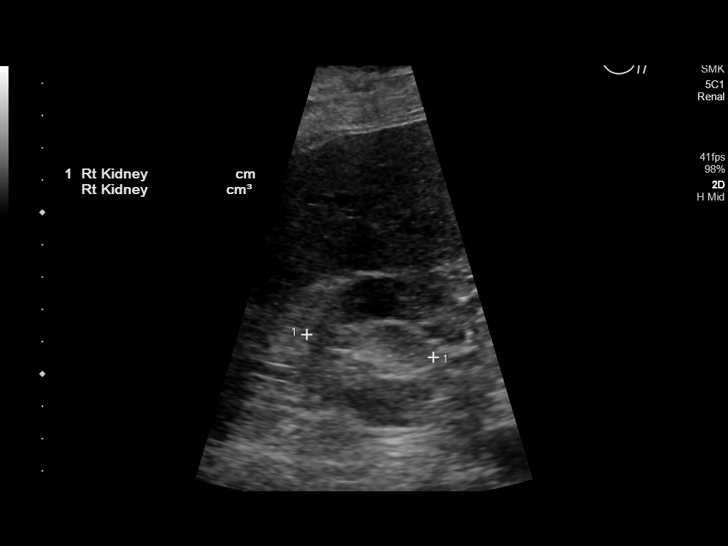
[im 13/34]
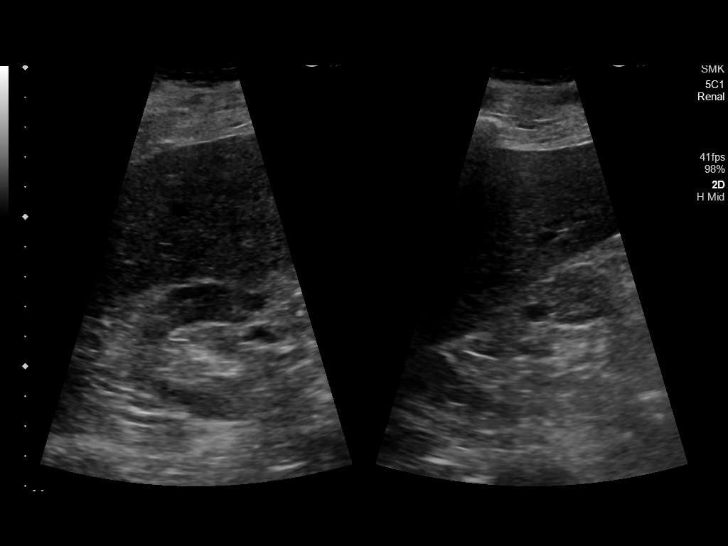
[im 16/34]
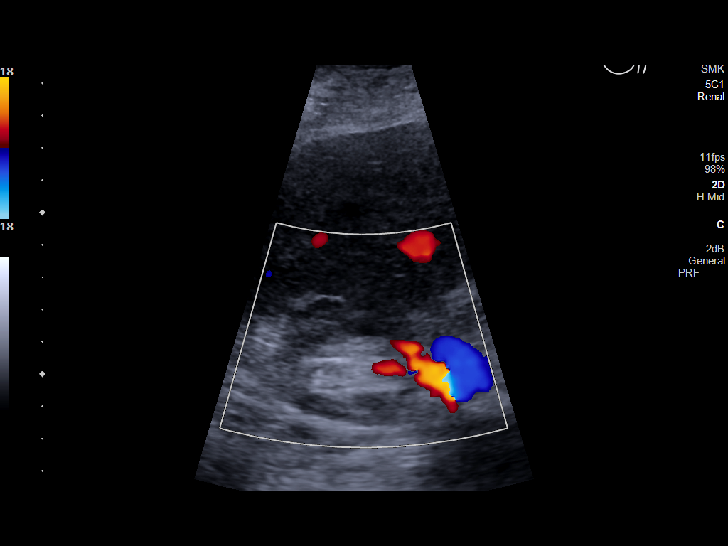
[im 18/34]
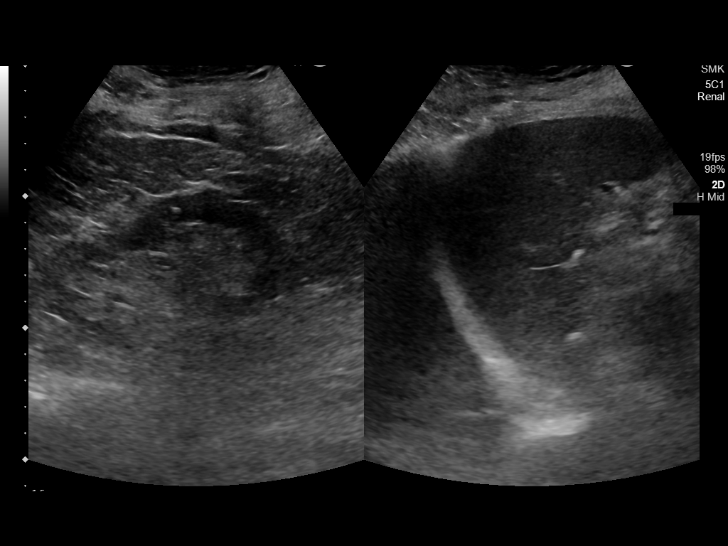
[im 21/34]
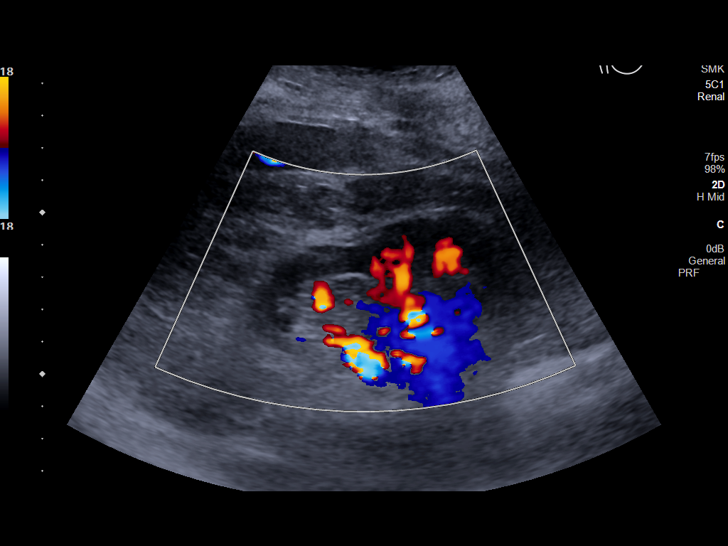
[im 23/34]
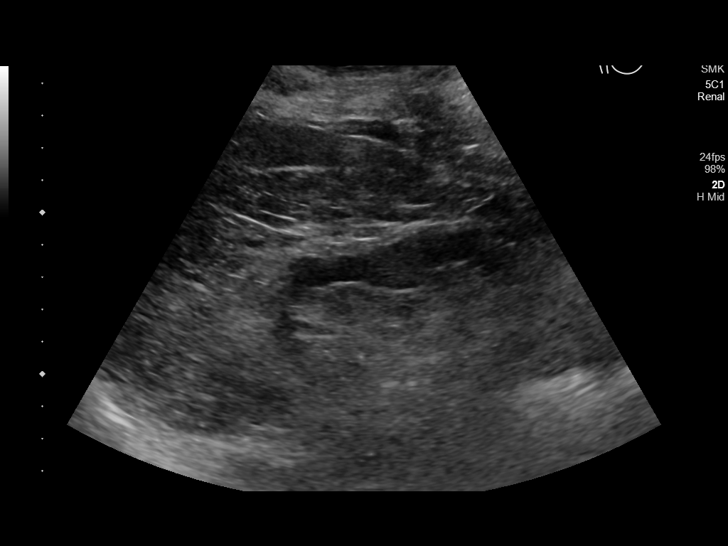
[im 25/34]
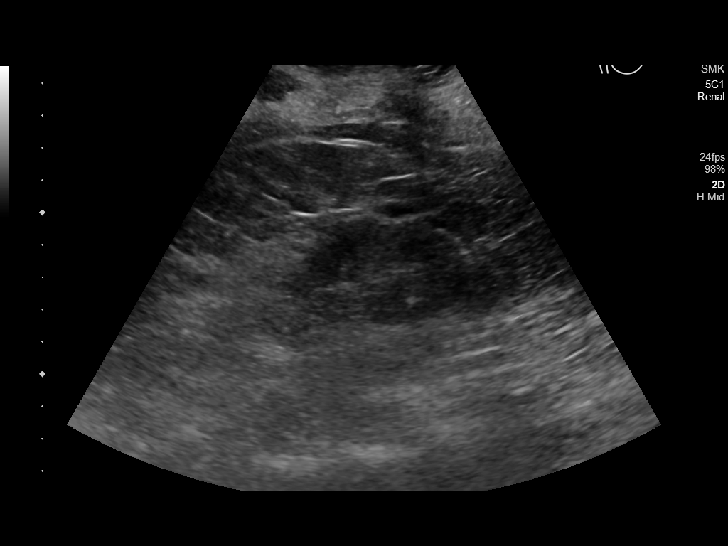
[im 28/34]
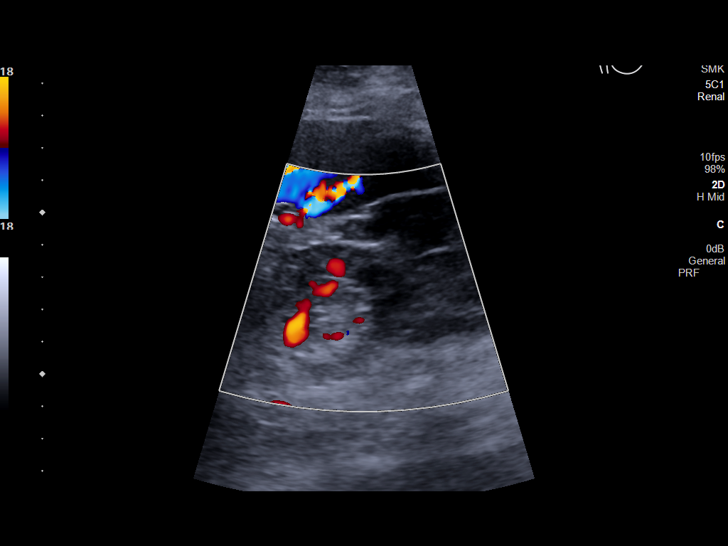
[im 31/34]
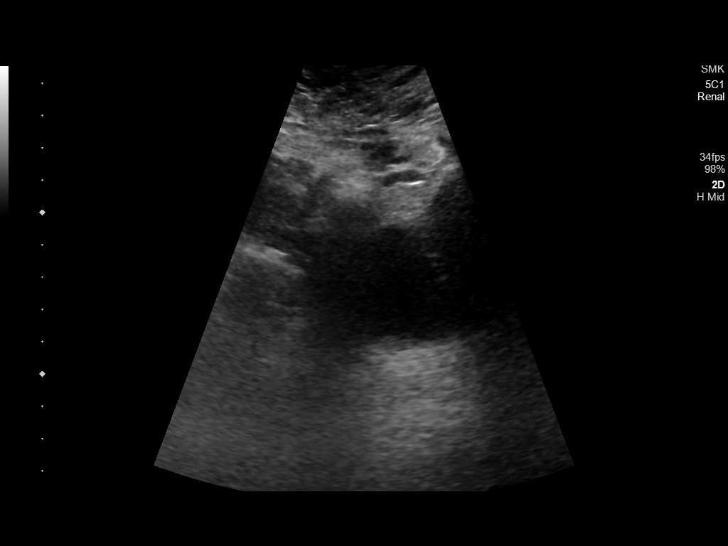
[im 34/34]
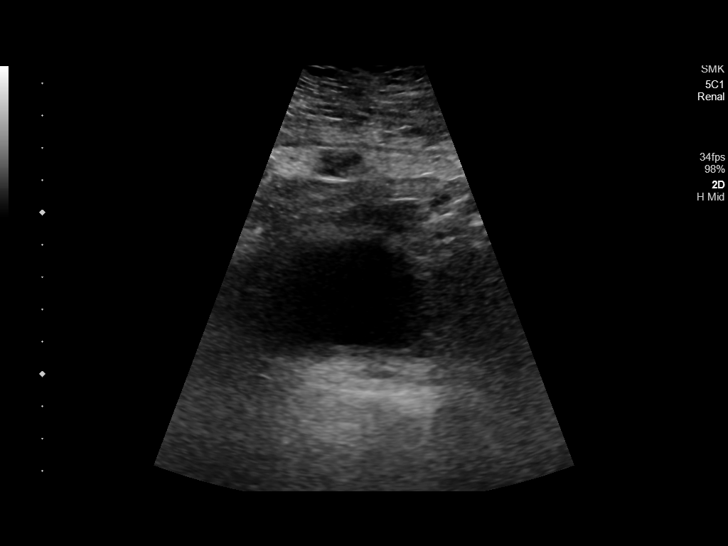

[14 of 25 positions shown; findings below may reference images not displayed]

FINDINGS: Right Kidney:

Renal measurements: 9.2 x 4.7 x 4.0 cm = volume: 90 mL. Echogenicity
within normal limits. No mass or hydronephrosis visualized. 0.8 cm
hypoechoic structure in the medial right kidney favored to be a
simple cyst. Definitive characterization is difficult due to
subcentimeter size.

Left Kidney:

Renal measurements: 9.4 x 4.6 x 3.9 cm = volume: 88 mL. Echogenicity
within normal limits. No mass or hydronephrosis visualized.

Bladder:

Appears normal for degree of bladder distention.

Other:

None.
IMPRESSION: No significant sonographic abnormality of the kidneys.

## 2023-04-25 ENCOUNTER — Ambulatory Visit: Payer: Medicare Other | Admitting: Gastroenterology

## 2023-04-25 ENCOUNTER — Other Ambulatory Visit (INDEPENDENT_AMBULATORY_CARE_PROVIDER_SITE_OTHER): Payer: Medicare Other

## 2023-04-25 ENCOUNTER — Encounter: Payer: Self-pay | Admitting: Gastroenterology

## 2023-04-25 VITALS — BP 138/80 | HR 99 | Ht 63.0 in | Wt 160.0 lb

## 2023-04-25 DIAGNOSIS — K838 Other specified diseases of biliary tract: Secondary | ICD-10-CM | POA: Diagnosis not present

## 2023-04-25 DIAGNOSIS — Z9884 Bariatric surgery status: Secondary | ICD-10-CM | POA: Diagnosis not present

## 2023-04-25 DIAGNOSIS — R634 Abnormal weight loss: Secondary | ICD-10-CM

## 2023-04-25 DIAGNOSIS — R1319 Other dysphagia: Secondary | ICD-10-CM

## 2023-04-25 DIAGNOSIS — R1310 Dysphagia, unspecified: Secondary | ICD-10-CM

## 2023-04-25 DIAGNOSIS — R194 Change in bowel habit: Secondary | ICD-10-CM

## 2023-04-25 DIAGNOSIS — R110 Nausea: Secondary | ICD-10-CM

## 2023-04-25 DIAGNOSIS — I480 Paroxysmal atrial fibrillation: Secondary | ICD-10-CM | POA: Diagnosis not present

## 2023-04-25 DIAGNOSIS — K551 Chronic vascular disorders of intestine: Secondary | ICD-10-CM

## 2023-04-25 DIAGNOSIS — K625 Hemorrhage of anus and rectum: Secondary | ICD-10-CM

## 2023-04-25 LAB — COMPREHENSIVE METABOLIC PANEL
ALT: 13 U/L (ref 0–35)
AST: 21 U/L (ref 0–37)
Albumin: 4 g/dL (ref 3.5–5.2)
Alkaline Phosphatase: 97 U/L (ref 39–117)
BUN: 35 mg/dL — ABNORMAL HIGH (ref 6–23)
CO2: 30 mEq/L (ref 19–32)
Calcium: 9.3 mg/dL (ref 8.4–10.5)
Chloride: 104 mEq/L (ref 96–112)
Creatinine, Ser: 1.67 mg/dL — ABNORMAL HIGH (ref 0.40–1.20)
GFR: 29.75 mL/min — ABNORMAL LOW (ref >60.00)
Glucose, Bld: 110 mg/dL — ABNORMAL HIGH (ref 70–99)
Potassium: 4.5 mEq/L (ref 3.5–5.1)
Sodium: 143 mEq/L (ref 135–145)
Total Bilirubin: 0.6 mg/dL (ref 0.2–1.2)
Total Protein: 6.6 g/dL (ref 6.0–8.3)

## 2023-04-25 LAB — CBC
HCT: 37.2 % (ref 36.0–46.0)
Hemoglobin: 12.3 g/dL (ref 12.0–15.0)
MCHC: 33.1 g/dL (ref 30.0–36.0)
MCV: 90.7 fl (ref 78.0–100.0)
Platelets: 178 10*3/uL (ref 150.0–400.0)
RBC: 4.1 Mil/uL (ref 3.87–5.11)
RDW: 16.9 % — ABNORMAL HIGH (ref 11.5–15.5)
WBC: 4.6 10*3/uL (ref 4.0–10.5)

## 2023-04-25 LAB — AMYLASE: Amylase: 44 U/L (ref 27–131)

## 2023-04-25 LAB — LIPASE: Lipase: 50 U/L (ref 11.0–59.0)

## 2023-04-25 NOTE — Patient Instructions (Addendum)
_______________________________________________________  If your blood pressure at your visit was 140/90 or greater, please contact your primary care physician to follow up on this. _______________________________________________________  If you are age 77 or older, your body mass index should be between 23-30. Your Body mass index is 28.34 kg/m. If this is out of the aforementioned range listed, please consider follow up with your Primary Care Provider. ________________________________________________________  The [facility] providers would like to encourage you to use [facility] to communicate with providers for non-urgent requests or questions.  Due to long hold times on the telephone, sending your provider a message by [facility] may be a faster and more efficient way to get a response.  Please allow 48 business hours for a response.  Please remember that this is for non-urgent requests.  _______________________________________________________  Your provider has requested that you go to the basement level for lab work before leaving today. Press "B" on the elevator. The lab is located at the first door on the left as you exit the elevator.  Please purchase the following medications over the counter and take as directed:  START: Fibercon daily  If you have not had a bowel movement in 2-3 days, please try dulcolax 10mg  every other day.  Or you can also try Miralax 1 capful daily.  You have been scheduled for an endoscopy. Please follow written instructions given to you at your visit today.  If you use inhalers (even only as needed), please bring them with you on the day of your procedure.  If you take any of the following medications, they will need to be adjusted prior to your procedure:   DO NOT TAKE 7 DAYS PRIOR TO TEST- Trulicity (dulaglutide) Ozempic, Wegovy (semaglutide) Mounjaro (tirzepatide) Bydureon Bcise (exanatide extended release)  DO NOT TAKE 1 DAY PRIOR TO YOUR  TEST Rybelsus (semaglutide) Adlyxin (lixisenatide) Victoza (liraglutide) Byetta (exanatide) ___________________________________________________________________________  Thank you for choosing me and [facility].  Dr. Meridee Score

## 2023-04-25 NOTE — Progress Notes (Unsigned)
GASTROENTEROLOGY OUTPATIENT CLINIC VISIT   Primary Care Provider Mansouraty, Netty Starring., MD [address] [place] [address] [phone]  Referring Provider [name], NP [address] [place],  [place] [address] [phone]  Patient Profile: Amy Underwood is a 77 y.o. female with a pmh significant for  The patient presents to the [facility] for an evaluation and management of problem(s) noted below:  Problem List 1. Dilated bile duct   2. Esophageal dysphagia   3. Bowel habit changes   4. History of gastric bypass   5. Paroxysmal atrial fibrillation (HCC)     History of Present Illness This is the patient's first visit the outpatient [facility] clinic.   The patient does/does not take NSAIDs or BC/Goody Powder. Patient has/has not had an EGD. Patient has/has not had a Colonoscopy.  GI Review of Systems Positive as above Negative for  Pyrosis; Reflux; Regurgitation; Dysphagia; Odynophagia; Globus; Post-prandial cough; Nocturnal cough; Nasal regurgitation; Epigastric pain; Nausea; Vomiting; Hematemesis; Jaundice; Change in Appetite; Early satiety; Abdominal pain; Abdominal bloating; Eructation; Flatulence; Change in BM Frequency; Change in BM Consistency; Constipation; Diarrhea; Incontinence; Urgency; Tenesmus; Hematochezia; Melena  Review of Systems General: Denies fevers/chills/weight loss/night sweats HEENT: Denies oral lesions/sore throat/headaches/visual changes Cardiovascular: Denies chest pain/palpitations Pulmonary: Denies shortness of breath/cough Gastroenterological: See HPI Genitourinary: Denies darkened urine or hematuria Hematological: Denies easy bruising/bleeding Endocrine: Denies temperature intolerance Dermatological: Denies skin changes Psychological: Mood is stable Allergy & Immunology: Denies severe allergic reactions Musculoskeletal: Denies new arthralgias   Medications Current Outpatient Medications   Medication Sig Dispense Refill   albuterol (VENTOLIN HFA) 108 (90 Base) MCG/ACT inhaler Inhale 1-2 puffs into the lungs every 6 (six) hours as needed for wheezing or shortness of breath. 6.7 g 0   allopurinol (ZYLOPRIM) 100 MG tablet Take 100 mg by mouth at bedtime.     amLODipine (NORVASC) 10 MG tablet Take 1 tablet (10 mg total) by mouth daily. 180 tablet 3   Apremilast (OTEZLA) 30 MG TABS Take 1 tablet (30 mg total) by mouth 2 (two) times daily. 180 tablet 3   atorvastatin (LIPITOR) 20 MG tablet TAKE 1 TABLET BY MOUTH ONCE  DAILY FOR CHOLESTEROL 90 tablet 2   Calcium-Magnesium-Vitamin D (CALCIUM 1200+D3 PO) Take 1 tablet by mouth daily.     Cholecalciferol (DIALYVITE VITAMIN D 5000) 125 MCG (5000 UT) capsule Take 5,000 Units by mouth daily.     cyanocobalamin 1000 MCG tablet Take 1,000 mcg by mouth daily. Vit B12     DULoxetine (CYMBALTA) 60 MG capsule Take 1 capsule (60 mg total) by mouth daily. 90 capsule 3   famotidine (PEPCID) 40 MG tablet Take 1 tablet (40 mg total) by mouth every evening. for heartburn. 90 tablet 0   FARXIGA 10 MG TABS tablet Take 10 mg by mouth every morning.     ferrous sulfate 325 (65 FE) MG tablet Take 650 mg by mouth See admin instructions. Every three weeks for one week     furosemide (LASIX) 20 MG tablet Take 1 tablet (20 mg total) by mouth daily as needed. 30 tablet 3   ibandronate (BONIVA) 150 MG tablet Take 1 tablet (150 mg total) by mouth every 30 (thirty) days. Take in the morning with a full glass of water, on an empty stomach, and do not take anything else by mouth or lie down for the next 30 min. 3 tablet 3   losartan (COZAAR) 100 MG tablet TAKE 1 TABLET BY MOUTH ONCE  DAILY FOR BLOOD PRESSURE 90 tablet 2   MAGnesium-Oxide 400 (240 Mg) MG tablet Take 1 tablet (400 mg total) by mouth daily. 90 tablet 0   ondansetron (ZOFRAN-ODT) 4 MG disintegrating tablet Take 1 tablet (4 mg total) by mouth every 8 (eight) hours as needed for nausea or vomiting. 20 tablet 0    oxyCODONE (ROXICODONE) 5 MG immediate release tablet Take 1 tablet (5 mg total) by mouth every 4 (four) hours as needed for severe pain. 30 tablet 0   tiZANidine (ZANAFLEX) 4 MG tablet Take 0.5-1 tablets (2-4 mg total) by mouth daily. 20 tablet 0   triamcinolone cream (KENALOG) 0.1 % Apply 1 Application topically daily as needed. Up to 5 days per week. Avoid face, groin, underarms. 80 g 3   No current facility-administered medications for this visit.    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 Lalla Brothers   Psoriasis    Past Surgical History:  Procedure Laterality Date   ATRIAL FIBRILLATION ABLATION N/A 01/03/2022   Procedure: ATRIAL FIBRILLATION ABLATION;  Surgeon: Lanier Prude, MD;  [facility] IN[facility]IVE [facility];  Service: Cardiovascular;  Laterality: N/A;   CARPAL TUNNEL RELEASE Right 01/18/2023   Procedure: CARPAL TUNNEL RELEASE;  Surgeon: Jones Broom, MD;  Location: [facility] OR[facility];  Service: Orthopedics;  Laterality: Right;   CESAREAN SECTION     3   CHOLECYSTECTOMY     HIP ARTHROPLASTY Left    KNEE ARTHROPLASTY Bilateral    LEFT ATRIAL APPENDAGE OCCLUSION N/A 04/20/2022   Procedure: LEFT ATRIAL APPENDAGE OCCLUSION;  Surgeon: Lanier Prude, MD;  Location: [facility] INVASIVE CV LAB;  Service: Cardiovascular;  Laterality: N/A;   REVERSE SHOULDER ARTHROPLASTY Right 01/18/2023   Procedure: REVERSE SHOULDER ARTHROPLASTY;   Surgeon: Jones Broom, MD;  Location: [facility];  Service: Orthopedics;  Laterality: Right;   TEE WITHOUT CARDIOVERSION N/A 04/20/2022   Procedure: TRANSESOPHAGEAL ECHOCARDIOGRAM (TEE);  Surgeon: Lanier Prude, MD;  Location: [facility] INVASIVE CV LAB;  Service: Cardiovascular;  Laterality: N/A;   Social History   Socioeconomic History   Marital status: Widowed    Spouse name: Not on file   Number of children: 2   Years of education: Not on file   Highest education level: 12th grade  Occupational History   Occupation: retired  Tobacco Use   Smoking status: Former    Current packs/day: 2.00    Average packs/day: 2.0 packs/day for 25.0 years (50.1 ttl pk-yrs)    Types: Cigarettes    Start date: 2001   Smokeless tobacco: Former  Building services engineer status: Never Used  Substance and Sexual Activity   Alcohol use: Never   Drug use: Never   Sexual activity: Not Currently  Other Topics Concern   Not on file  Social History Narrative   Not on file   Social Determinants of Health   Financial Resource Strain: Low  Risk  (12/22/2022)   Overall Financial Resource Strain (CARDIA)    Difficulty of Paying Living Expenses: Not hard at all  Food Insecurity: No Food Insecurity (12/22/2022)   Hunger Vital Sign    Worried About Running Out of Food in the Last Year: Never true    Ran Out of Food in the Last Year: Never true  Transportation Needs: No Transportation Needs (12/22/2022)   PRAPARE - Administrator, Civil Service (Medical): No    Lack of Transportation (Non-Medical): No  Physical Activity: Insufficiently Active (12/22/2022)   Exercise Vital Sign    Days of Exercise per Week: 3 days    Minutes of Exercise per Session: 10 min  Stress: Stress Concern Present (12/22/2022)   [employer] of Occupational Health - Occupational Stress Questionnaire    Feeling of Stress : To some extent  Social Connections: Socially Isolated (12/22/2022)   Social Connection and Isolation  Panel [NHANES]    Frequency of Communication with Friends and Family: More than three times a week    Frequency of Social Gatherings with Friends and Family: Three times a week    Attends Religious Services: Never    Active Member of Clubs or Organizations: No    Attends Banker Meetings: Never    Marital Status: Widowed  Intimate Partner Violence: Not At Risk (04/03/2022)   Humiliation, Afraid, Rape, and Kick questionnaire    Fear of Current or Ex-Partner: No    Emotionally Abused: No    Physically Abused: No    Sexually Abused: No   Family History  Problem Relation Age of Onset   Heart disease Mother    Breast cancer Neg Hx    Colon cancer Neg Hx    Esophageal cancer Neg Hx    Inflammatory bowel disease Neg Hx    Liver disease Neg Hx    Pancreatic cancer Neg Hx    Rectal cancer Neg Hx    Stomach cancer Neg Hx    I have reviewed her medical, social, and family history in detail and updated the electronic medical record as necessary.    PHYSICAL EXAMINATION  BP 138/80   Pulse 99   Ht 5\' 3"  (1.6 m)   Wt 160 lb (72.6 kg)   BMI 28.34 kg/m  Wt Readings from Last 3 Encounters:  04/25/23 160 lb (72.6 kg)  04/03/23 161 lb (73 kg)  03/07/23 162 lb (73.5 kg)   GEN: NAD, appears stated age, doesn't appear chronically ill PSYCH: Cooperative, without pressured speech EYE: Conjunctivae pink, sclerae anicteric ENT: MMM, without oral ulcers, no erythema or exudates noted NECK: Supple CV: RR without R/Gs  RESP: CTAB posteriorly, without wheezing GI: NABS, soft, NT/ND, without rebound or guarding, no HSM appreciated GU: DRE shows MSK/EXT: _ edema, no palmar erythema SKIN: No jaundice, no spider angiomata, no concerning rashes NEURO:  Alert & Oriented x 3, no focal deficits, no evidence of asterixis   REVIEW OF DATA  I reviewed the following data at the time of this encounter:  GI Procedures and Studies  ***  Laboratory Studies  Reviewed those in  epic  Imaging Studies  July 2024 MRI/MRCP IMPRESSION: 1. Status post cholecystectomy. Unchanged, severe intra and extrahepatic biliary ductal dilatation, the common bile duct measuring up to 1.5 cm in caliber. The common bile duct tapers smoothly to the ampulla and no calculus or other obstruction is identified. This is most likely benign postoperative biliary ductal dilatation in the absence of biochemical  evidence of cholestasis. ERCP can be considered to further evaluate if clinically indicated. 2. Definitively benign, macroscopic fat containing bilateral adrenal adenomata, requiring no further follow-up or characterization. 3. 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   ASSESSMENT  Ms. Jurkovich is a 77 y.o. female with a pmh significant for The patient is seen today for evaluation and management of:  1. Dilated bile duct   2. Esophageal dysphagia   3. Bowel habit changes   4. History of gastric bypass   5. Paroxysmal atrial fibrillation (HCC)     ***   PLAN  There are no diagnoses linked to this encounter.   Orders Placed This Encounter  Procedures   CBC   Comp Met (CMET)   Lipase   Amylase   Tissue transglutaminase, IgA   IgA   Ambulatory referral to Gastroenterology    New Prescriptions   No medications on file   Modified Medications   No medications on file    Planned Follow Up No follow-ups on file.   Total Time in Face-to-Face and in Coordination of Care for patient including independent/personal interpretation/review of prior testing, medical history, examination, medication adjustment, communicating results with the patient directly, and documentation within the EHR is ***.   Amy Parish, MD [facility]oscopy Office # [phone]

## 2023-04-26 ENCOUNTER — Ambulatory Visit: Payer: Medicare Other | Admitting: Dermatology

## 2023-04-26 LAB — TISSUE TRANSGLUTAMINASE, IGA: (tTG) Ab, IgA: <1 U/mL

## 2023-04-26 LAB — IGA: Immunoglobulin A: 184 mg/dL (ref 70–320)

## 2023-04-27 ENCOUNTER — Encounter: Payer: Self-pay | Admitting: Gastroenterology

## 2023-04-27 DIAGNOSIS — K838 Other specified diseases of biliary tract: Secondary | ICD-10-CM | POA: Insufficient documentation

## 2023-04-27 DIAGNOSIS — R1310 Dysphagia, unspecified: Secondary | ICD-10-CM | POA: Insufficient documentation

## 2023-04-27 DIAGNOSIS — Z9884 Bariatric surgery status: Secondary | ICD-10-CM | POA: Insufficient documentation

## 2023-04-27 DIAGNOSIS — R194 Change in bowel habit: Secondary | ICD-10-CM | POA: Insufficient documentation

## 2023-04-28 DIAGNOSIS — R110 Nausea: Secondary | ICD-10-CM

## 2023-04-28 DIAGNOSIS — R634 Abnormal weight loss: Secondary | ICD-10-CM

## 2023-04-28 DIAGNOSIS — K625 Hemorrhage of anus and rectum: Secondary | ICD-10-CM | POA: Insufficient documentation

## 2023-04-28 DIAGNOSIS — K551 Chronic vascular disorders of intestine: Secondary | ICD-10-CM | POA: Insufficient documentation

## 2023-04-28 DIAGNOSIS — R112 Nausea with vomiting, unspecified: Secondary | ICD-10-CM | POA: Insufficient documentation

## 2023-04-28 HISTORY — DX: Abnormal weight loss: R63.4

## 2023-04-28 HISTORY — DX: Nausea: R11.0

## 2023-04-30 ENCOUNTER — Telehealth: Payer: Self-pay

## 2023-04-30 NOTE — Telephone Encounter (Signed)
-----   Message from Rossie Muskrat [facility] sent at 04/29/2023 11:53 AM EDT ----- Regarding: RE: [facility] patient Can we have her see the AF clinic? She needs an ECG. No need to restart OAC given watchman in place. Thanks, Sheria Lang ----- Message ----- From: Lemar Lofty., MD Sent: 04/25/2023   4:50 PM EDT To: Lanier Prude, MD Subject: [facility] patient                                 [facility], [facility] you are doing well. I just met this patient today for further workup and evaluation of abdominal pain and dysphagia and changes in bowel habits. She is not having any significant symptoms at this time, but on my physical exam today she has recurrence of her atrial fibrillation. She has no symptoms of palpitations or chest pain or shortness of breath and she did not know that she was in atrial fibrillation. Just want to bring that to your attention, in case she needs to be considered for anticoagulation restart. I am planning some endoscopic evaluation in the coming weeks, so if she were to go on anticoagulation therapy, I would just need to know.  I am planning her upper endoscopy and possible dilation on 9/6. Thanks. GM

## 2023-04-30 NOTE — Telephone Encounter (Signed)
Spoke with the patient and advised on recommendations from Dr. Lalla Brothers. Patient states that she would rather not go to [facility] for an appointment. Scheduled her for an appointment with Suzann in [place].

## 2023-05-02 ENCOUNTER — Ambulatory Visit: Payer: Medicare Other

## 2023-05-02 VITALS — Ht 63.0 in | Wt 160.0 lb

## 2023-05-02 DIAGNOSIS — Z0000 Encounter for general adult medical examination without abnormal findings: Secondary | ICD-10-CM

## 2023-05-02 NOTE — Progress Notes (Unsigned)
Cardiology Office Note Date:  05/02/2023  Patient ID:  Ruksana, Osinski 08-20-1946, MRN [id number] PCP:  [name], NP  Cardiologist:  None Electrophysiologist: Lanier Prude, MD  ***refresh   Chief Complaint: ***  History of Present Illness: Ruqaiyah Laba is a 77 y.o. female with PMH notable for persis Afib, s/p LAAO with watchman, HFpEF, HTN; seen today for Lanier Prude, MD for acute visit due to afib.   She is s/p atypical clockwise ablation w CTI, AF ablation with PVI, Posterior wall on 01/2022.  She is s/p LAAO with watchman 04/2022.  Patient reports ***.  she denies chest pain, palpitations, dyspnea, PND, orthopnea, nausea, vomiting, dizziness, syncope, edema, weight gain, or early satiety.     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 Lalla Brothers   Psoriasis     Past Surgical History:  Procedure Laterality Date   ATRIAL FIBRILLATION ABLATION N/A 01/03/2022   Procedure: ATRIAL FIBRILLATION ABLATION;  Surgeon: Lanier Prude, MD;  [facility];  Service: Cardiovascular;  Laterality: N/A;   CARPAL TUNNEL RELEASE Right 01/18/2023   Procedure: CARPAL TUNNEL RELEASE;  Surgeon: Jones Broom, MD;  Location: [facility];  Service: Orthopedics;  Laterality: Right;   CESAREAN SECTION     3   CHOLECYSTECTOMY     HIP ARTHROPLASTY Left    KNEE ARTHROPLASTY Bilateral    LEFT ATRIAL APPENDAGE OCCLUSION N/A 04/20/2022    Procedure: LEFT ATRIAL APPENDAGE OCCLUSION;  Surgeon: Lanier Prude, MD;  [facility]: [facility];  Service: Cardiovascular;  Laterality: N/A;   REVERSE SHOULDER ARTHROPLASTY Right 01/18/2023   Procedure: REVERSE SHOULDER ARTHROPLASTY;  Surgeon: Jones Broom, MD;  Location: [facility];  Service: Orthopedics;  Laterality: Right;   TEE WITHOUT CARDIOVERSION N/A 04/20/2022   Procedure: TRANSESOPHAGEAL ECHOCARDIOGRAM (TEE);  Surgeon: Lanier Prude, MD;  Location: [facility] INVASIVE CV LAB;  Service: Cardiovascular;  Laterality: N/A;    Current Outpatient Medications  Medication Instructions   albuterol (VENTOLIN HFA) 108 (90 Base) MCG/ACT inhaler 1-2 puffs, Inhalation, Every 6 hours PRN   allopurinol (ZYLOPRIM) 100 mg, Oral, Daily at bedtime   amLODipine (NORVASC) 10 mg, Oral, Daily   atorvastatin (LIPITOR) 20 mg, Oral, Daily, for cholesterol.   Calcium-Magnesium-Vitamin D (CALCIUM 1200+D3 PO) 1 tablet, Oral, Daily   cyanocobalamin 1,000 mcg, Oral, Daily, Vit B12   Dialyvite Vitamin D 5000 5,000 Units, Oral, Daily   DULoxetine (CYMBALTA) 60 mg, Oral, Daily   famotidine (PEPCID) 40 mg, Oral, Every evening, for heartburn.   Farxiga 10 mg, Oral, Every morning   ferrous sulfate 650 mg, Oral, See admin instructions, Every three weeks for one week   furosemide (LASIX) 20 mg, Oral, Daily PRN   ibandronate (BONIVA) 150 mg, Oral, Every 30 days, Take in the morning with a full glass of water, on an empty stomach, and do not take anything else by mouth or lie down  for the next 30 min.   losartan (COZAAR) 100 mg, Oral, Daily, for blood pressure   magnesium oxide (MAGNESIUM-OXIDE) 400 mg, Oral, Daily   ondansetron (ZOFRAN-ODT) 4 mg, Oral, Every 8 hours PRN   Otezla 30 mg, Oral, 2 times daily   oxyCODONE (ROXICODONE) 5 mg, Oral, Every 4 hours PRN   tiZANidine (ZANAFLEX) 2-4 mg, Oral, Daily   triamcinolone cream (KENALOG) 0.1 % 1 Application, Topical, Daily PRN, Up to 5 days per week. Avoid face,  groin, underarms.    Social History:  The patient  reports that she has quit smoking. Her smoking use included cigarettes. She started smoking about 23 years ago. She has a 50.1 pack-year smoking history. She has quit using smokeless tobacco. She reports that she does not drink alcohol and does not use drugs.   Family History:  *** include only if pertinent The patient's family history includes Heart disease in her mother.***  ROS:  Please see the history of present illness. All other systems are reviewed and otherwise negative.   PHYSICAL EXAM: *** VS:  There were no vitals taken for this visit. BMI: There is no height or weight on file to calculate BMI.  GEN- The patient is well appearing, alert and oriented x 3 today.   Lungs- Clear to ausculation bilaterally, normal work of breathing.  Heart- {Blank single:19197::"Regular","Irregularly irregular"} rate and rhythm, no murmurs, rubs or gallops Extremities- {EDEMA LEVEL:28147::"No"} peripheral edema, warm, dry   EKG {ACTION; IS/IS OZH:[id number]} ordered. Personal review of EKG from {Blank single:19197::"today","***"} shows:  ***        Recent Labs: 02/07/2023: B Natriuretic Peptide 309.1 04/25/2023: ALT 13; BUN 35; Creatinine, Ser 1.67; Hemoglobin 12.3; Platelets 178.0; Potassium 4.5; Sodium 143  06/01/2022: Cholesterol 164; HDL 84.60; LDL Cholesterol 63; Total CHOL/HDL Ratio 2; Triglycerides 82.0; VLDL 16.4   Estimated Creatinine Clearance: 27.8 mL/min (A) (by C-G formula based on SCr of 1.67 mg/dL (H)).   Wt Readings from Last 3 Encounters:  05/02/23 160 lb (72.6 kg)  04/25/23 160 lb (72.6 kg)  04/03/23 161 lb (73 kg)     Additional studies reviewed include: Previous EP, cardiology notes.   CT cardiac, 06/22/2022 1. Well placed 31 mm Watchman FLX with no leak, full endothelialization and average compression 18%  2.  Moderate bi atrial enlargement  3. 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   Normal ascending thoracic aorta 3.5 cm  5.  No perciardial effusion  6.  Normal PV anatomy measurements above  TTE, 10/27/2021  1. Left ventricular ejection fraction, by estimation, is 60 to 65%. Left ventricular ejection fraction by 2D MOD biplane is 63.8 %. The left ventricle has normal function. The left ventricle has no regional wall motion abnormalities. There is moderate left ventricular hypertrophy of the basal-septal segment. Left ventricular diastolic parameters are consistent with Grade II diastolic dysfunction (pseudonormalization). The average left ventricular global longitudinal strain is -17.8 %. The global longitudinal strain is normal.   2. Right ventricular systolic function is normal. The right ventricular size is normal. There is normal pulmonary artery systolic pressure.   3. Left atrial size was mildly dilated.   4. The mitral valve is degenerative. Mild mitral valve regurgitation.   5. The aortic valve is calcified. Aortic valve regurgitation is mild. Mild aortic valve stenosis. Aortic valve mean gradient measures 8.0 mmHg.   6. The inferior vena cava is normal in size with greater than 50% respiratory variability, suggesting right atrial pressure  of 3 mmHg.    ASSESSMENT AND PLAN:  #) ***   #) ***   {Are you ordering a CV Procedure (e.g. stress test, cath, DCCV, TEE, etc)?   Press F2        :[id number]}   Current medicines are reviewed at length with the patient today.   The patient {ACTIONS; HAS/DOES NOT HAVE:19233} concerns regarding her medicines.  The following changes were made today:  {NONE DEFAULTED:18576}  Labs/ tests ordered today include: *** No orders of the defined types were placed in this encounter.    Disposition: Follow up with {EPMDS:28135} or EP APP {EPFOLLOW UP:28173}   Signed, Sherie Don, NP  05/02/23  12:31 PM  Electrophysiology [facility]

## 2023-05-02 NOTE — Patient Instructions (Signed)
Ms. Urben , Thank you for taking time to come for your Medicare Wellness Visit. I appreciate your ongoing commitment to your health goals. Please review the following plan we discussed and let me know if I can assist you in the future.   Referrals/Orders/Follow-Ups/Clinician Recommendations: Aim for 30 minutes of exercise or brisk walking, 6-8 glasses of water, and 5 servings of fruits and vegetables each day.   This is a list of the screening recommended for you and due dates:  Health Maintenance  Topic Date Due   Screening for Lung Cancer  Never done   COVID-19 Vaccine (4 - 2023-24 season) 05/05/2022   Medicare Annual Wellness Visit  04/04/2023   Flu Shot  04/05/2023   Hepatitis C Screening  12/26/2023*   DTaP/Tdap/Td vaccine (3 - Tdap) 08/03/2025   Colon Cancer Screening  03/11/2029   Pneumonia Vaccine  Completed   DEXA scan (bone density measurement)  Completed   Zoster (Shingles) Vaccine  Completed   HPV Vaccine  Aged Out  *Topic was postponed. The date shown is not the original due date.    Advanced directives: (Copy Requested) Please bring a copy of your health care power of attorney and living will to the office to be added to your chart at your convenience.  Next Medicare Annual Wellness Visit scheduled for next year: Yes

## 2023-05-02 NOTE — Progress Notes (Addendum)
Subjective:   Amy Underwood is a 77 y.o. female who presents for Medicare Annual (Subsequent) preventive examination.  Visit Complete: Virtual  I connected with  Amy Underwood on 05/02/23 by a audio enabled telemedicine application and verified that I am speaking with the correct person using two identifiers.  Patient Location: Home  Provider Location: Home Office  I discussed the limitations of evaluation and management by telemedicine. The patient expressed understanding and agreed to proceed.  Patient Medicare AWV questionnaire was completed by the patient on 04/30/23; I have confirmed that all information answered by patient is correct and no changes since this date.  Vital Signs: Because this visit was a virtual/telehealth visit, some criteria may be missing or patient reported. Any vitals not documented were not able to be obtained and vitals that have been documented are patient reported.    Review of Systems      Cardiac Risk Factors include: sedentary lifestyle;hypertension;dyslipidemia;advanced age (>53men, >50 women)     Objective:    Today's Vitals   05/02/23 0947  Weight: 160 lb (72.6 kg)  Height: 5\' 3"  (1.6 m)   Body mass index is 28.34 kg/m.     05/02/2023    9:59 AM 02/07/2023   11:25 PM 01/18/2023    6:05 AM 01/04/2023    2:04 PM 04/20/2022    6:20 AM 04/03/2022    8:33 AM 01/03/2022    5:56 AM  Advanced Directives  Does Patient Have a Medical Advance Directive? Yes No No No No No No  Type of Estate agent of [place];Living will        Copy of Healthcare Power of Attorney in Chart? No - copy requested        Would patient like information on creating a medical advance directive?  No - Patient declined No - Patient declined No - Patient declined No - Patient declined No - Patient declined No - Patient declined    Current Medications (verified) Outpatient Encounter Medications as of 05/02/2023  Medication Sig   albuterol (VENTOLIN HFA) 108  (90 Base) MCG/ACT inhaler Inhale 1-2 puffs into the lungs every 6 (six) hours as needed for wheezing or shortness of breath.   allopurinol (ZYLOPRIM) 100 MG tablet Take 100 mg by mouth at bedtime.   amLODipine (NORVASC) 10 MG tablet Take 1 tablet (10 mg total) by mouth daily.   Apremilast (OTEZLA) 30 MG TABS Take 1 tablet (30 mg total) by mouth 2 (two) times daily.   atorvastatin (LIPITOR) 20 MG tablet TAKE 1 TABLET BY MOUTH ONCE  DAILY FOR CHOLESTEROL   Calcium-Magnesium-Vitamin D (CALCIUM 1200+D3 PO) Take 1 tablet by mouth daily.   Cholecalciferol (DIALYVITE VITAMIN D 5000) 125 MCG (5000 UT) capsule Take 5,000 Units by mouth daily.   cyanocobalamin 1000 MCG tablet Take 1,000 mcg by mouth daily. Vit B12   DULoxetine (CYMBALTA) 60 MG capsule Take 1 capsule (60 mg total) by mouth daily.   famotidine (PEPCID) 40 MG tablet Take 1 tablet (40 mg total) by mouth every evening. for heartburn.   FARXIGA 10 MG TABS tablet Take 10 mg by mouth every morning.   ferrous sulfate 325 (65 FE) MG tablet Take 650 mg by mouth See admin instructions. Every three weeks for one week   furosemide (LASIX) 20 MG tablet Take 1 tablet (20 mg total) by mouth daily as needed.   ibandronate (BONIVA) 150 MG tablet Take 1 tablet (150 mg total) by mouth every 30 (thirty) days. Take in  the morning with a full glass of water, on an empty stomach, and do not take anything else by mouth or lie down for the next 30 min.   losartan (COZAAR) 100 MG tablet TAKE 1 TABLET BY MOUTH ONCE  DAILY FOR BLOOD PRESSURE   MAGnesium-Oxide 400 (240 Mg) MG tablet Take 1 tablet (400 mg total) by mouth daily.   ondansetron (ZOFRAN-ODT) 4 MG disintegrating tablet Take 1 tablet (4 mg total) by mouth every 8 (eight) hours as needed for nausea or vomiting.   oxyCODONE (ROXICODONE) 5 MG immediate release tablet Take 1 tablet (5 mg total) by mouth every 4 (four) hours as needed for severe pain. (Patient not taking: Reported on 05/02/2023)   tiZANidine  (ZANAFLEX) 4 MG tablet Take 0.5-1 tablets (2-4 mg total) by mouth daily. (Patient not taking: Reported on 05/02/2023)   triamcinolone cream (KENALOG) 0.1 % Apply 1 Application topically daily as needed. Up to 5 days per week. Avoid face, groin, underarms. (Patient not taking: Reported on 05/02/2023)   No facility-administered encounter medications on file as of 05/02/2023.    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 Lalla Brothers   Psoriasis    Past Surgical History:  Procedure Laterality Date   ATRIAL FIBRILLATION ABLATION N/A 01/03/2022   Procedure: ATRIAL FIBRILLATION ABLATION;  Surgeon: Lanier Prude, MD;  Location: [facility]IVE [facility];  Service: Cardiovascular;  Laterality: N/A;   CARPAL TUNNEL RELEASE Right 01/18/2023   Procedure: CARPAL TUNNEL RELEASE;  Surgeon: Jones Broom, MD;  Location: [facility];  Service: Orthopedics;  Laterality: Right;   CESAREAN SECTION     3   CHOLECYSTECTOMY     HIP ARTHROPLASTY Left    KNEE ARTHROPLASTY Bilateral    LEFT ATRIAL APPENDAGE OCCLUSION N/A 04/20/2022   Procedure: LEFT ATRIAL APPENDAGE OCCLUSION;  Surgeon: Lanier Prude, MD;  Location: [facility];  Service: Cardiovascular;  Laterality: N/A;   REVERSE SHOULDER ARTHROPLASTY Right 01/18/2023   Procedure: REVERSE SHOULDER ARTHROPLASTY;  Surgeon: Jones Broom, MD;  Location: [facility];  Service: Orthopedics;  Laterality: Right;   TEE WITHOUT CARDIOVERSION N/A 04/20/2022    Procedure: TRANSESOPHAGEAL ECHOCARDIOGRAM (TEE);  Surgeon: Lanier Prude, MD;  Location: [facility] CV LAB;  Service: Cardiovascular;  Laterality: N/A;   Family History  Problem Relation Age of Onset   Heart disease Mother    Breast cancer Neg Hx    Colon cancer Neg Hx    Esophageal cancer Neg Hx    Inflammatory bowel disease Neg Hx    Liver disease Neg Hx    Pancreatic cancer Neg Hx    Rectal cancer Neg Hx    Stomach cancer Neg Hx    Social History   Socioeconomic History   Marital status: Widowed    Spouse name: Not on file   Number of children: 2   Years of education: Not on file   Highest education level: 12th grade  Occupational History  Occupation: retired  Tobacco Use   Smoking status: Former    Current packs/day: 2.00    Average packs/day: 2.0 packs/day for 25.1 years (50.1 ttl pk-yrs)    Types: Cigarettes    Start date: 2001   Smokeless tobacco: Former  Building services engineer status: Never Used  Substance and Sexual Activity   Alcohol use: Never   Drug use: Never   Sexual activity: Not Currently  Other Topics Concern   Not on file  Social History Narrative   Not on file   Social Determinants of Health   Financial Resource Strain: Low Risk  (04/30/2023)   Overall Financial Resource Strain (CARDIA)    Difficulty of Paying Living Expenses: Not very hard  Food Insecurity: No Food Insecurity (04/30/2023)   Hunger Vital Sign    Worried About Running Out of Food in the Last Year: Never true    Ran Out of Food in the Last Year: Never true  Transportation Needs: No Transportation Needs (04/30/2023)   PRAPARE - Administrator, Civil Service (Medical): No    Lack of Transportation (Non-Medical): No  Physical Activity: Insufficiently Active (04/30/2023)   Exercise Vital Sign    Days of Exercise per Week: 2 days    Minutes of Exercise per Session: 20 min  Stress: No Stress Concern Present (04/30/2023)   [employer] of Occupational Health -  Occupational Stress Questionnaire    Feeling of Stress : Not at all  Social Connections: Moderately Isolated (04/30/2023)   Social Connection and Isolation Panel [NHANES]    Frequency of Communication with Friends and Family: More than three times a week    Frequency of Social Gatherings with Friends and Family: More than three times a week    Attends Religious Services: More than 4 times per year    Active Member of [employer] or Organizations: No    Attends Banker Meetings: Never    Marital Status: Widowed    Tobacco Counseling Counseling given: Not Answered   Clinical Intake:  Pre-visit preparation completed: Yes  Pain : No/denies pain     BMI - recorded: 28.34 Nutritional Status: BMI 25 -29 Overweight Nutritional Risks: None Diabetes: No  How often do you need to have someone help you when you read instructions, pamphlets, or other written materials from your doctor or pharmacy?: 1 - Never  Interpreter Needed?: No  Information entered by :: C.Ival Basquez LPN   Activities of Daily Living    04/30/2023    4:39 PM 01/04/2023    2:01 PM  In your present state of health, do you have any difficulty performing the following activities:  Hearing? 0 0  Vision? 0 0  Difficulty concentrating or making decisions? 0 0  Walking or climbing stairs? 1 1  Comment Just feels uncomfortable coming down stairs due to knee pain  Dressing or bathing? 0 0  Doing errands, shopping? 0   Preparing Food and eating ? N   Using the Toilet? N   In the past six months, have you accidently leaked urine? N   Do you have problems with loss of bowel control? N   Managing your Medications? N   Managing your Finances? N   Housekeeping or managing your Housekeeping? N     Patient Care Team: [name], NP as PCP - General (Internal Medicine) Lanier Prude, MD as PCP - Electrophysiology (Cardiology) Jones Broom, MD as Consulting Physician (Orthopedic Surgery)  Indicate  any recent  Medical Services you may have received from other than [facility] providers in the past year (date may be approximate).     Assessment:   This is a routine wellness examination for Nevayah.  Hearing/Vision screen Hearing Screening - Comments:: Denies hearing difficulties   Vision Screening - Comments:: Glasses - Feels like needs new rx -[facility] - UTD on exams  Dietary issues and exercise activities discussed:     Goals Addressed             This Visit's Progress    Patient Stated       Maintain current health status and stay active.       Depression Screen    05/02/2023    9:52 AM 12/26/2022    7:32 AM 04/03/2022    8:37 AM 05/25/2021    9:50 AM  PHQ 2/9 Scores  PHQ - 2 Score 0 0 0 0  PHQ- 9 Score    1    Fall Risk    04/30/2023    4:39 PM 12/26/2022    7:32 AM 10/11/2022    2:22 PM 04/03/2022    8:34 AM 05/25/2021    9:50 AM  Fall Risk   Falls in the past year? 1 0 1 0 1  Number falls in past yr: 0 0 0 0 0  Comment lost balance      Injury with Fall? 0 0 0 0 0  Risk for fall due to : No Fall Risks No Fall Risks History of fall(s)    Follow up Falls prevention discussed;Falls evaluation completed Falls evaluation completed Falls evaluation completed Falls evaluation completed;Education provided;Falls prevention discussed Falls evaluation completed    MEDICARE RISK AT HOME: Medicare Risk at Home Any stairs in or around the home?: No If so, are there any without handrails?: No Home free of loose throw rugs in walkways, pet beds, electrical cords, etc?: Yes Adequate lighting in your home to reduce risk of falls?: Yes Life alert?: No Use of a cane, walker or w/c?: Yes Grab bars in the bathroom?: Yes Shower chair or bench in shower?: Yes Elevated toilet seat or a handicapped toilet?: Yes  TIMED UP AND GO:  Was the test performed?  No    Cognitive Function:        05/02/2023   10:03 AM 04/03/2022    8:39 AM  6CIT Screen  What Year? 0 points 0  points  What month? 0 points 0 points  What time? 0 points 0 points  Count back from 20 0 points 0 points  Months in reverse 2 points 2 points  Repeat phrase 4 points 2 points  Total Score 6 points 4 points    Immunizations Immunization History  Administered Date(s) Administered   Fluad Quad(high Dose 65+) 05/25/2021, 06/01/2022   Influenza, High Dose Seasonal PF 05/31/2019   PFIZER(Purple Top)SARS-COV-2 Vaccination 11/07/2019, 11/28/2019, 06/21/2020   Pneumococcal Conjugate-13 07/06/2015   Pneumococcal Polysaccharide-23 08/05/2013, 06/04/2017   Td 09/04/2002, 08/04/2015   Zoster Recombinant(Shingrix) 04/02/2018, 06/05/2018   Zoster, Live 09/09/2008    TDAP status: Up to date  Flu Vaccine status: Due, Education has been provided regarding the importance of this vaccine. Advised may receive this vaccine at local pharmacy or Health Dept. Aware to provide a copy of the vaccination record if obtained from local pharmacy or Health Dept. Verbalized acceptance and understanding.  Pneumococcal vaccine status: Up to date  Covid-19 vaccine status: Information provided on how to obtain vaccines.   Qualifies for  Shingles Vaccine? Yes   Zostavax completed Yes   Shingrix Completed?: Yes  Screening Tests Health Maintenance  Topic Date Due   Lung Cancer Screening  Never done   COVID-19 Vaccine (4 - 2023-24 season) 05/05/2022   INFLUENZA VACCINE  04/05/2023   Hepatitis C Screening  12/26/2023 (Originally 08/30/1964)   Medicare Annual Wellness (AWV)  05/01/2024   DTaP/Tdap/Td (3 - Tdap) 08/03/2025   Colonoscopy  03/11/2029   Pneumonia Vaccine 14+ Years old  Completed   DEXA SCAN  Completed   Zoster Vaccines- Shingrix  Completed   HPV VACCINES  Aged Out    Health Maintenance  Health Maintenance Due  Topic Date Due   Lung Cancer Screening  Never done   COVID-19 Vaccine (4 - 2023-24 season) 05/05/2022   INFLUENZA VACCINE  04/05/2023    Colorectal cancer screening: No longer  required.   Mammogram status: No longer required due to age.  Bone Density status: Completed 09/25/22. Results reflect: Bone density results: OSTEOPOROSIS. Repeat every 2 years.  Lung Cancer Screening: (Low Dose CT Chest recommended if Age 52-80 years, 20 pack-year currently smoking OR have quit w/in 15years.) does qualify.   Lung Cancer Screening Referral: will discuss with PCP  Additional Screening:  Hepatitis C Screening: does qualify; Completed DUE  Vision Screening: Recommended annual ophthalmology exams for early detection of glaucoma and other disorders of the eye. Is the patient up to date with their annual eye exam?  Yes  Who is the provider or what is the name of the office in which the patient attends annual eye exams? [facility] If pt is not established with a provider, would they like to be referred to a provider to establish care? No .   Dental Screening: Recommended annual dental exams for proper oral hygiene  Diabetic Foot Exam:   Community Resource Referral / Chronic Care Management: CRR required this visit?  No   CCM required this visit?  No     Plan:     I have personally reviewed and noted the following in the patient's chart:   Medical and social history Use of alcohol, tobacco or illicit drugs  Current medications and supplements including opioid prescriptions. Patient is not currently taking opioid prescriptions. Functional ability and status Nutritional status Physical activity Advanced directives List of other physicians Hospitalizations, surgeries, and ER visits in previous 12 months Vitals Screenings to include cognitive, depression, and falls Referrals and appointments  In addition, I have reviewed and discussed with patient certain preventive protocols, quality metrics, and best practice recommendations. A written personalized care plan for preventive services as well as general preventive health recommendations were provided to  patient.     Maryan Puls, LPN   12/11/8117   After Visit Summary: (MyChart) Due to this being a telephonic visit, the after visit summary with patients personalized plan was offered to patient via MyChart   Nurse Notes: none

## 2023-05-03 ENCOUNTER — Encounter: Payer: Self-pay | Admitting: Cardiology

## 2023-05-03 ENCOUNTER — Ambulatory Visit: Payer: Medicare Other | Attending: Cardiology | Admitting: Cardiology

## 2023-05-03 ENCOUNTER — Ambulatory Visit (INDEPENDENT_AMBULATORY_CARE_PROVIDER_SITE_OTHER): Payer: Medicare Other

## 2023-05-03 VITALS — BP 150/78 | HR 100 | Ht 63.0 in | Wt 160.0 lb

## 2023-05-03 DIAGNOSIS — I480 Paroxysmal atrial fibrillation: Secondary | ICD-10-CM

## 2023-05-03 DIAGNOSIS — I10 Essential (primary) hypertension: Secondary | ICD-10-CM | POA: Diagnosis not present

## 2023-05-03 DIAGNOSIS — Z95818 Presence of other cardiac implants and grafts: Secondary | ICD-10-CM

## 2023-05-03 NOTE — Patient Instructions (Signed)
Medication Instructions:   Your physician recommends that you continue on your current medications as directed. Please refer to the Current Medication list given to you today.  *If you need a refill on your cardiac medications before your next appointment, please call your pharmacy*   Lab Work:  NONE  If you have labs (blood work) drawn today and your tests are completely normal, you will receive your results only by: MyChart Message (if you have MyChart) OR A paper copy in the mail If you have any lab test that is abnormal or we need to change your treatment, we will call you to review the results.   Testing/Procedures:  Your physician has recommended that you wear a Zio monitor for 14 days.  This monitor is a medical device that records the heart's electrical activity. Doctors most often use these monitors to diagnose arrhythmias. Arrhythmias are problems with the speed or rhythm of the heartbeat. The monitor is a small device applied to your chest. You can wear one while you do your normal daily activities. While wearing this monitor if you have any symptoms to push the button and record what you felt. Once you have worn this monitor for the period of time provider prescribed (Usually 14 days), you will return the monitor device in the postage paid box. Once it is returned they will download the data collected and provide [place] with a report which the provider will then review and we will call you with those results. Important tips:  Avoid showering during the first 24 hours of wearing the monitor. Avoid excessive sweating to help maximize wear time. Do not submerge the device, no hot tubs, and no swimming pools. Keep any lotions or oils away from the patch. After 24 hours you may shower with the patch on. Take brief showers with your back facing the shower head.  Do not remove patch once it has been placed because that will interrupt data and decrease adhesive wear time. Push the button  when you have any symptoms and write down what you were feeling. Once you have completed wearing your monitor, remove and place into box which has postage paid and place in your outgoing mailbox.  If for some reason you have misplaced your box then call our office and we can provide another box and/or mail it off for you.      Follow-Up: At [facility], you and your health needs are our priority.  As part of our continuing mission to provide you with exceptional heart care, we have created designated Provider Care Teams.  These Care Teams include your primary Cardiologist (physician) and Advanced Practice Providers (APPs -  Physician Assistants and Nurse Practitioners) who all work together to provide you with the care you need, when you need it.  We recommend signing up for the patient portal called "MyChart".  Sign up information is provided on this After Visit Summary.  MyChart is used to connect with patients for Virtual Visits (Telemedicine).  Patients are able to view lab/test results, encounter notes, upcoming appointments, etc.  Non-urgent messages can be sent to your provider as well.   To learn more about what you can do with MyChart, go to [email or web address].    Your next appointment:   6 week(s)  Provider:   Sherie Don, NP

## 2023-05-08 DIAGNOSIS — I480 Paroxysmal atrial fibrillation: Secondary | ICD-10-CM

## 2023-05-11 ENCOUNTER — Encounter: Payer: Self-pay | Admitting: Gastroenterology

## 2023-05-11 ENCOUNTER — Ambulatory Visit (AMBULATORY_SURGERY_CENTER): Payer: Medicare Other | Admitting: Gastroenterology

## 2023-05-11 VITALS — BP 132/79 | HR 112 | Temp 98.0°F | Resp 19 | Ht 63.0 in | Wt 160.0 lb

## 2023-05-11 DIAGNOSIS — R1319 Other dysphagia: Secondary | ICD-10-CM | POA: Diagnosis not present

## 2023-05-11 DIAGNOSIS — K222 Esophageal obstruction: Secondary | ICD-10-CM | POA: Diagnosis not present

## 2023-05-11 MED ORDER — OMEPRAZOLE 20 MG PO CPDR: DELAYED_RELEASE_CAPSULE | ORAL | 3 refills | Status: DC

## 2023-05-11 MED ORDER — SODIUM CHLORIDE 0.9 % IV SOLN: 500.0000 mL | Freq: Once | INTRAVENOUS | Status: DC

## 2023-05-11 NOTE — Progress Notes (Unsigned)
Report to PACU, RN, vss, BBS= Clear.  

## 2023-05-11 NOTE — Progress Notes (Unsigned)
GASTROENTEROLOGY PROCEDURE H&P NOTE   Primary Care Physician: [name], NP  HPI: Amy Underwood is a 77 y.o. female who presents for EGD for evaluation of GERD/Dysphagia.  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 Lalla Brothers   Psoriasis    Past Surgical History:  Procedure Laterality Date   ATRIAL FIBRILLATION ABLATION N/A 01/03/2022   Procedure: ATRIAL FIBRILLATION ABLATION;  Surgeon: Lanier Prude, MD;  Location: [facility]ASIVE CV [facility];  Service: Cardiovascular;  Laterality: N/A;   CARPAL TUNNEL RELEASE Right 01/18/2023   Procedure: CARPAL TUNNEL RELEASE;  Surgeon: Jones Broom, MD;  Location: [facility];  Service: Orthopedics;  Laterality: Right;   CESAREAN SECTION     3   CHOLECYSTECTOMY     HIP ARTHROPLASTY Left    KNEE ARTHROPLASTY Bilateral    LEFT ATRIAL APPENDAGE OCCLUSION N/A 04/20/2022   Procedure: LEFT ATRIAL APPENDAGE OCCLUSION;  Surgeon: Lanier Prude, MD;  Location: [facility] CV [facility];  Service: Cardiovascular;  Laterality: N/A;   REVERSE SHOULDER ARTHROPLASTY Right 01/18/2023   Procedure: REVERSE SHOULDER ARTHROPLASTY;  Surgeon: Jones Broom, MD;  Location: [facility];  Service: Orthopedics;  Laterality: Right;   TEE WITHOUT CARDIOVERSION N/A 04/20/2022   Procedure: TRANSESOPHAGEAL ECHOCARDIOGRAM (TEE);  Surgeon: Lanier Prude, MD;  Location: [facility] INVASIVE CV LAB;  Service: Cardiovascular;  Laterality: N/A;   Current Outpatient Medications  Medication Sig Dispense Refill   allopurinol  (ZYLOPRIM) 100 MG tablet Take 100 mg by mouth at bedtime.     amLODipine (NORVASC) 10 MG tablet Take 1 tablet (10 mg total) by mouth daily. 180 tablet 3   Apremilast (OTEZLA) 30 MG TABS Take 1 tablet (30 mg total) by mouth 2 (two) times daily. 180 tablet 3   atorvastatin (LIPITOR) 20 MG tablet TAKE 1 TABLET BY MOUTH ONCE  DAILY FOR CHOLESTEROL 90 tablet 2   Calcium-Magnesium-Vitamin D (CALCIUM 1200+D3 PO) Take 1 tablet by mouth daily.     Cholecalciferol (DIALYVITE VITAMIN D 5000) 125 MCG (5000 UT) capsule Take 5,000 Units by mouth daily.     cyanocobalamin 1000 MCG tablet Take 1,000 mcg by mouth daily. Vit B12     DULoxetine (CYMBALTA) 60 MG capsule Take 1 capsule (60 mg total) by mouth daily. 90 capsule 3   famotidine (PEPCID) 40 MG tablet Take 1 tablet (40 mg total) by mouth every evening. for heartburn. 90 tablet 0   FARXIGA 10 MG TABS tablet Take 10 mg by mouth every morning.     furosemide (LASIX) 20 MG tablet Take 1 tablet (20 mg total) by mouth daily as needed. 30 tablet 3   losartan (COZAAR) 100 MG tablet TAKE 1 TABLET BY MOUTH ONCE  DAILY FOR BLOOD PRESSURE 90 tablet 2   MAGnesium-Oxide 400 (240 Mg) MG tablet Take 1 tablet (400 mg total) by mouth daily. 90 tablet 0   tiZANidine (ZANAFLEX) 4 MG tablet Take 0.5-1 tablets (2-4 mg total) by mouth daily. 20 tablet 0   albuterol (VENTOLIN HFA) 108 (90  Base) MCG/ACT inhaler Inhale 1-2 puffs into the lungs every 6 (six) hours as needed for wheezing or shortness of breath. 6.7 g 0   ibandronate (BONIVA) 150 MG tablet Take 1 tablet (150 mg total) by mouth every 30 (thirty) days. Take in the morning with a full glass of water, on an empty stomach, and do not take anything else by mouth or lie down for the next 30 min. 3 tablet 3   ondansetron (ZOFRAN-ODT) 4 MG disintegrating tablet Take 1 tablet (4 mg total) by mouth every 8 (eight) hours as needed for nausea or vomiting. 20 tablet 0   oxyCODONE (ROXICODONE) 5 MG immediate release tablet Take 1  tablet (5 mg total) by mouth every 4 (four) hours as needed for severe pain. 30 tablet 0   triamcinolone cream (KENALOG) 0.1 % Apply 1 Application topically daily as needed. Up to 5 days per week. Avoid face, groin, underarms. 80 g 3   Current Facility-Administered Medications  Medication Dose Route Frequency Provider Last Rate Last Admin   0.9 %  sodium chloride infusion  500 mL Intravenous Once Mansouraty, Netty Starring., MD        Current Outpatient Medications:    allopurinol (ZYLOPRIM) 100 MG tablet, Take 100 mg by mouth at bedtime., Disp: , Rfl:    amLODipine (NORVASC) 10 MG tablet, Take 1 tablet (10 mg total) by mouth daily., Disp: 180 tablet, Rfl: 3   Apremilast (OTEZLA) 30 MG TABS, Take 1 tablet (30 mg total) by mouth 2 (two) times daily., Disp: 180 tablet, Rfl: 3   atorvastatin (LIPITOR) 20 MG tablet, TAKE 1 TABLET BY MOUTH ONCE  DAILY FOR CHOLESTEROL, Disp: 90 tablet, Rfl: 2   Calcium-Magnesium-Vitamin D (CALCIUM 1200+D3 PO), Take 1 tablet by mouth daily., Disp: , Rfl:    Cholecalciferol (DIALYVITE VITAMIN D 5000) 125 MCG (5000 UT) capsule, Take 5,000 Units by mouth daily., Disp: , Rfl:    cyanocobalamin 1000 MCG tablet, Take 1,000 mcg by mouth daily. Vit B12, Disp: , Rfl:    DULoxetine (CYMBALTA) 60 MG capsule, Take 1 capsule (60 mg total) by mouth daily., Disp: 90 capsule, Rfl: 3   famotidine (PEPCID) 40 MG tablet, Take 1 tablet (40 mg total) by mouth every evening. for heartburn., Disp: 90 tablet, Rfl: 0   FARXIGA 10 MG TABS tablet, Take 10 mg by mouth every morning., Disp: , Rfl:    furosemide (LASIX) 20 MG tablet, Take 1 tablet (20 mg total) by mouth daily as needed., Disp: 30 tablet, Rfl: 3   losartan (COZAAR) 100 MG tablet, TAKE 1 TABLET BY MOUTH ONCE  DAILY FOR BLOOD PRESSURE, Disp: 90 tablet, Rfl: 2   MAGnesium-Oxide 400 (240 Mg) MG tablet, Take 1 tablet (400 mg total) by mouth daily., Disp: 90 tablet, Rfl: 0   tiZANidine (ZANAFLEX) 4 MG tablet, Take 0.5-1 tablets (2-4 mg  total) by mouth daily., Disp: 20 tablet, Rfl: 0   albuterol (VENTOLIN HFA) 108 (90 Base) MCG/ACT inhaler, Inhale 1-2 puffs into the lungs every 6 (six) hours as needed for wheezing or shortness of breath., Disp: 6.7 g, Rfl: 0   ibandronate (BONIVA) 150 MG tablet, Take 1 tablet (150 mg total) by mouth every 30 (thirty) days. Take in the morning with a full glass of water, on an empty stomach, and do not take anything else by mouth or lie down for the next 30 min., Disp: 3 tablet, Rfl: 3   ondansetron (ZOFRAN-ODT) 4 MG disintegrating tablet, Take 1 tablet (4 mg  total) by mouth every 8 (eight) hours as needed for nausea or vomiting., Disp: 20 tablet, Rfl: 0   oxyCODONE (ROXICODONE) 5 MG immediate release tablet, Take 1 tablet (5 mg total) by mouth every 4 (four) hours as needed for severe pain., Disp: 30 tablet, Rfl: 0   triamcinolone cream (KENALOG) 0.1 %, Apply 1 Application topically daily as needed. Up to 5 days per week. Avoid face, groin, underarms., Disp: 80 g, Rfl: 3  Current Facility-Administered Medications:    0.9 %  sodium chloride infusion, 500 mL, Intravenous, Once, Mansouraty, Netty Starring., MD Allergies  Allergen Reactions   Alendronate Palpitations   Cefaclor Hives and Rash   Family History  Problem Relation Age of Onset   Heart disease Mother    Breast cancer Neg Hx    Colon cancer Neg Hx    Esophageal cancer Neg Hx    Inflammatory bowel disease Neg Hx    Liver disease Neg Hx    Pancreatic cancer Neg Hx    Rectal cancer Neg Hx    Stomach cancer Neg Hx    Social History   Socioeconomic History   Marital status: Widowed    Spouse name: Not on file   Number of children: 2   Years of education: Not on file   Highest education level: 12th grade  Occupational History   Occupation: retired  Tobacco Use   Smoking status: Former    Current packs/day: 2.00    Average packs/day: 2.0 packs/day for 25.1 years (50.2 ttl pk-yrs)    Types: Cigarettes    Start date: 2001    Smokeless tobacco: Former  Building services engineer status: Never Used  Substance and Sexual Activity   Alcohol use: Never   Drug use: Never   Sexual activity: Not Currently  Other Topics Concern   Not on file  Social History Narrative   Not on file   Social Determinants of Health   Financial Resource Strain: Low Risk  (04/30/2023)   Overall Financial Resource Strain (CARDIA)    Difficulty of Paying Living Expenses: Not very hard  Food Insecurity: No Food Insecurity (04/30/2023)   Hunger Vital Sign    Worried About Running Out of Food in the Last Year: Never true    Ran Out of Food in the Last Year: Never true  Transportation Needs: No Transportation Needs (04/30/2023)   PRAPARE - Administrator, Civil Service (Medical): No    Lack of Transportation (Non-Medical): No  Physical Activity: Insufficiently Active (04/30/2023)   Exercise Vital Sign    Days of Exercise per Week: 2 days    Minutes of Exercise per Session: 20 min  Stress: No Stress Concern Present (04/30/2023)   [employer] of Occupational Health - Occupational Stress Questionnaire    Feeling of Stress : Not at all  Social Connections: Moderately Isolated (04/30/2023)   Social Connection and Isolation Panel [NHANES]    Frequency of Communication with Friends and Family: More than three times a week    Frequency of Social Gatherings with Friends and Family: More than three times a week    Attends Religious Services: More than 4 times per year    Active Member of [employer] or Organizations: No    Attends Banker Meetings: Never    Marital Status: Widowed  Intimate Partner Violence: Not At Risk (05/02/2023)   Humiliation, Afraid, Rape, and Kick questionnaire    Fear of Current or Ex-Partner: No    Emotionally  Abused: No    Physically Abused: No    Sexually Abused: No    Physical Exam: Today's Vitals   05/11/23 0712  BP: (!) 153/79  Pulse: 77  Temp: 98 F (36.7 C)  SpO2: 98%  Weight: 160 lb  (72.6 kg)  Height: 5\' 3"  (1.6 m)   Body mass index is 28.34 kg/m. GEN: NAD EYE: Sclerae anicteric ENT: MMM CV: Non-tachycardic GI: Soft, NT/ND NEURO:  Alert & Oriented x 3  Lab Results: No results for input(s): "WBC", "HGB", "HCT", "PLT" in the last 72 hours. BMET No results for input(s): "NA", "K", "CL", "CO2", "GLUCOSE", "BUN", "CREATININE", "CALCIUM" in the last 72 hours. LFT No results for input(s): "PROT", "ALBUMIN", "AST", "ALT", "ALKPHOS", "BILITOT", "BILIDIR", "IBILI" in the last 72 hours. PT/INR No results for input(s): "LABPROT", "INR" in the last 72 hours.   Impression / Plan: This is a 77 y.o.female who presents for EGD for evaluation of GERD/Dysphagia.  The risks and benefits of endoscopic evaluation/treatment were discussed with the patient and/or family; these include but are not limited to the risk of perforation, infection, bleeding, missed lesions, lack of diagnosis, severe illness requiring hospitalization, as well as anesthesia and sedation related illnesses.  The patient's history has been reviewed, patient examined, no change in status, and deemed stable for procedure.  The patient and/or family is agreeable to proceed.    Corliss Parish, MD [facility] Advanced [facility]oscopy Office # [phone]

## 2023-05-11 NOTE — Progress Notes (Unsigned)
Called to room to assist during endoscopic procedure.  Patient ID and intended procedure confirmed with present staff. Received instructions for my participation in the procedure from the performing physician.  

## 2023-05-11 NOTE — Patient Instructions (Addendum)
Please use Cepacol or Halls Lozenges +/- Chloracetic spray for next 72-96 hrs to aid in sore throat should experience this. Post dilation diet- hand out provided Start Omeprazole  YOU HAD AN ENDOSCOPIC PROCEDURE TODAY: Refer to the procedure report and other information in the discharge instructions given to you for any specific questions about what was found during the examination. If this information does not answer your questions, please call [place] office at [phone] to clarify.   YOU SHOULD EXPECT: Some feelings of bloating in the abdomen. Passage of more gas than usual. Walking can help get rid of the air that was put into your GI tract during the procedure and reduce the bloating. If you had a lower endoscopy (such as a colonoscopy or flexible sigmoidoscopy) you may notice spotting of blood in your stool or on the toilet paper. Some abdominal soreness may be present for a day or two, also.  DIET: Your first meal following the procedure should be a light meal and then it is ok to progress to your normal diet. A half-sandwich or bowl of soup is an example of a good first meal. Heavy or fried foods are harder to digest and may make you feel nauseous or bloated. Drink plenty of fluids but you should avoid alcoholic beverages for 24 hours. If you had a esophageal dilation, please see attached instructions for diet.    ACTIVITY: Your care partner should take you home directly after the procedure. You should plan to take it easy, moving slowly for the rest of the day. You can resume normal activity the day after the procedure however YOU SHOULD NOT DRIVE, use power tools, machinery or perform tasks that involve climbing or major physical exertion for 24 hours (because of the sedation medicines used during the test).   SYMPTOMS TO REPORT IMMEDIATELY: A gastroenterologist can be reached at any hour. Please call [phone]  for any of the following symptoms:  Following upper endoscopy (EGD, EUS,  ERCP, esophageal dilation) Vomiting of blood or coffee ground material  New, significant abdominal pain  New, significant chest pain or pain under the shoulder blades  Painful or persistently difficult swallowing  New shortness of breath  Black, tarry-looking or red, bloody stools  FOLLOW UP:  If any biopsies were taken you will be contacted by phone or by letter within the next 1-3 weeks. Call [phone]  if you have not heard about the biopsies in 3 weeks.  Please also call with any specific questions about appointments or follow up tests.

## 2023-05-11 NOTE — Op Note (Signed)
[facility] Patient Name: Amy Underwood Procedure Date: 05/11/2023 7:35 AM MRN: [id number] Endoscopist: Corliss Parish , MD, [id number] Age: 77 Referring MD:  Date of Birth: 12/10/1945 Gender: Female Account #: [id number] Procedure:                Upper GI endoscopy Indications:              Dysphagia, Heartburn Medicines:                Monitored Anesthesia Care Procedure:                Pre-Anesthesia Assessment:                           - Prior to the procedure, a History and Physical                            was performed, and patient medications and                            allergies were reviewed. The patient's tolerance of                            previous anesthesia was also reviewed. The risks                            and benefits of the procedure and the sedation                            options and risks were discussed with the patient.                            All questions were answered, and informed consent                            was obtained. Prior Anticoagulants: The patient has                            taken no anticoagulant or antiplatelet agents. ASA                            Grade Assessment: II - A patient with mild systemic                            disease. After reviewing the risks and benefits,                            the patient was deemed in satisfactory condition to                            undergo the procedure.                           After obtaining informed consent, the endoscope was  passed under direct vision. Throughout the                            procedure, the patient's blood pressure, pulse, and                            oxygen saturations were monitored continuously. The                            Olympus Scope [id number] was introduced through the                            mouth, and advanced to the jejunum. The upper GI                            endoscopy was accomplished  without difficulty. The                            patient tolerated the procedure. Scope In: Scope Out: Findings:                 No gross lesions were noted in the entire                            esophagus. Biopsies were taken with a cold forceps                            for histology. A guidewire was placed and the scope                            was withdrawn. Dilation was performed with a Savary                            dilator with no resistance at 18 mm. The dilation                            site was examined following endoscope reinsertion                            and showed no change.                           A non-obstructing Schatzki ring was found at the                            gastroesophageal junction. This was disrupted with                            a cold forceps.                           The Z-line was regular and was found 34 cm from the  incisors.                           Evidence of a gastric bypass was found. A gastric                            pouch with a 6 cm length from the GE junction to                            the gastrojejunal anastomosis was found. The staple                            line appeared intact. The gastrojejunal anastomosis                            was characterized by healthy appearing mucosa. This                            was traversed. The pouch-to-jejunum limb was                            characterized by healthy appearing mucosa. 3                            staples removed today.                           Normal mucosa was found in the examined jejunum. Complications:            No immediate complications. Estimated Blood Loss:     Estimated blood loss was minimal. Impression:               - No gross lesions in the entire esophagus.                            Biopsied. Dilated.                           - Non-obstructing Schatzki ring. Disrupted.                           - Z-line  regular, 34 cm from the incisors.                           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 Gastrojejunal anastomosis                            characterized by healthy appearing mucosa. 3                            staples removed.                           -  Normal mucosa was found in the examined jejunum. Recommendation:           - The patient will be observed post-procedure,                            until all discharge criteria are met.                           - Discharge patient to home.                           - Patient has a contact number available for                            emergencies. The signs and symptoms of potential                            delayed complications were discussed with the                            patient. Return to normal activities tomorrow.                            Written discharge instructions were provided to the                            patient.                           - Dilation diet as per protocol.                           - Please use Cepacol or Halls Lozenges +/-                            Chloraseptic spray for next 72-96 hours to aid in                            sore thoat should you experience this.                           - Start Omeprazole 20 mg twice daily for 1 month                            and then decrease to once daily.                           - Continue present medications.                           - Await pathology results.                           - Repeat upper endoscopy with dilation if she gets  benefit from dilation.                           - If issues persist after dilation then esophageal                            manometry will need to be considered.                           - The findings and recommendations were discussed                            with the patient.                           - The  findings and recommendations were discussed                            with the patient's family. Corliss Parish, MD 05/11/2023 8:01:33 AM

## 2023-05-14 ENCOUNTER — Telehealth: Payer: Self-pay

## 2023-05-14 NOTE — Telephone Encounter (Signed)
  Follow up Call-     05/11/2023    7:12 AM  Call back number  Post procedure Call Back phone  # [phone]  Permission to leave phone message Yes     Patient questions:  Do you have a fever, pain , or abdominal swelling? No. Pain Score  0 *  Have you tolerated food without any problems? Yes.    Have you been able to return to your normal activities? Yes.    Do you have any questions about your discharge instructions: Diet   No. Medications  No. Follow up visit  No.  Do you have questions or concerns about your Care? No.  Actions: * If pain score is 4 or above: No action needed, pain <4.

## 2023-05-15 ENCOUNTER — Encounter: Payer: Self-pay | Admitting: Gastroenterology

## 2023-06-01 ENCOUNTER — Ambulatory Visit: Payer: Medicare Other | Admitting: Cardiology

## 2023-06-07 ENCOUNTER — Encounter: Payer: Self-pay | Admitting: Cardiovascular Disease

## 2023-06-07 ENCOUNTER — Ambulatory Visit: Payer: Medicare Other | Attending: Cardiovascular Disease | Admitting: Cardiovascular Disease

## 2023-06-07 VITALS — BP 150/74 | HR 80 | Ht 63.0 in | Wt 162.2 lb

## 2023-06-07 DIAGNOSIS — I10 Essential (primary) hypertension: Secondary | ICD-10-CM

## 2023-06-07 DIAGNOSIS — K551 Chronic vascular disorders of intestine: Secondary | ICD-10-CM | POA: Diagnosis not present

## 2023-06-07 DIAGNOSIS — I480 Paroxysmal atrial fibrillation: Secondary | ICD-10-CM

## 2023-06-07 NOTE — Patient Instructions (Signed)
Medication Instructions:  No changes *If you need a refill on your cardiac medications before your next appointment, please call your pharmacy*   Lab Work: None ordered If you have labs (blood work) drawn today and your tests are completely normal, you will receive your results only by: MyChart Message (if you have MyChart) OR A paper copy in the mail If you have any lab test that is abnormal or we need to change your treatment, we will call you to review the results.   Testing/Procedures: Your physician has requested that you have an Mesenteric Ultrasound.   No food after 11PM the night before.  Water is OK. (Don't drink liquids if you have been instructed not to for ANOTHER test). Avoid foods that produce bowel gas, for 24 hours prior to exam (see below). No breakfast, no chewing gum, no smoking or carbonated beverages. Patient may take morning medications with water. You may have to drink Ensure during this test. Come in for test at least 15 minutes early to register.    Follow-Up: At [facility], you and your health needs are our priority.  As part of our continuing mission to provide you with exceptional heart care, we have created designated Provider Care Teams.  These Care Teams include your primary Cardiologist (physician) and Advanced Practice Providers (APPs -  Physician Assistants and Nurse Practitioners) who all work together to provide you with the care you need, when you need it.  We recommend signing up for the patient portal called "MyChart".  Sign up information is provided on this After Visit Summary.  MyChart is used to connect with patients for Virtual Visits (Telemedicine).  Patients are able to view lab/test results, encounter notes, upcoming appointments, etc.  Non-urgent messages can be sent to your provider as well.   To learn more about what you can do with MyChart, go to [email or web address].    Your next appointment:   Follow up with Dr. Kirke Corin  after the testing

## 2023-06-07 NOTE — Progress Notes (Signed)
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 Medications reviewed by the patient verbally.       History of Present Illness: Amy Underwood is a 77 y.o. female who was referred by Dr. Lalla Brothers for abnormal CT abdomen and possible chronic mesenteric ischemia. She has history of persistent atrial fibrillation, Watchman device placement, chronic diastolic heart failure, chronic kidney disease, gastric bypass surgery and essential hypertension.  She has been having postprandial nausea and occasional vomiting with 30 pounds weight loss since last year which has been unintentional.  She reports fear of eating due to her GI symptoms.  She denies abdominal pain per se.  She had CT abdomen done in July which was personally reviewed by me.  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  The study was not a dedicated CTA study. She has been seen by Dr. Meridee Score and gastroenterology and had an EGD done which was unremarkable.  She did undergo esophageal dilatation last month but she reports no significant change in her symptoms.   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 Lalla Brothers    Psoriasis     Past Surgical History:  Procedure Laterality Date   ATRIAL FIBRILLATION ABLATION N/A 01/03/2022   Procedure: ATRIAL FIBRILLATION ABLATION;  Surgeon: Lanier Prude, MD;  [facility]: [facility];  Service: Cardiovascular;  Laterality: N/A;   CARPAL TUNNEL RELEASE Right 01/18/2023   Procedure: CARPAL TUNNEL RELEASE;  Surgeon: Jones Broom, MD;  Location: [facility];  Service: Orthopedics;  Laterality: Right;   CESAREAN SECTION     3   CHOLECYSTECTOMY     HIP ARTHROPLASTY Left    KNEE ARTHROPLASTY Bilateral    LEFT ATRIAL APPENDAGE OCCLUSION N/A 04/20/2022   Procedure: LEFT ATRIAL APPENDAGE OCCLUSION;  Surgeon: Lanier Prude, MD;  Location: [facility];  Service: Cardiovascular;  Laterality: N/A;   REVERSE SHOULDER ARTHROPLASTY Right 01/18/2023   Procedure: REVERSE SHOULDER ARTHROPLASTY;  Surgeon: Jones Broom, MD;  Location: [facility];  Service: Orthopedics;  Laterality: Right;   TEE WITHOUT CARDIOVERSION N/A 04/20/2022   Procedure: TRANSESOPHAGEAL ECHOCARDIOGRAM (TEE);  Surgeon: Lanier Prude, MD;  Location: [facility] INVASIVE CV LAB;  Service: Cardiovascular;  Laterality: N/A;     Current Outpatient Medications  Medication Sig Dispense Refill   allopurinol (ZYLOPRIM) 100 MG tablet Take 100 mg by mouth at bedtime.     amLODipine (NORVASC) 10 MG tablet Take 1 tablet (10 mg total) by mouth daily. 180 tablet 3   Apremilast (OTEZLA) 30 MG TABS Take 1 tablet (30 mg total) by mouth 2 (two) times daily. 180  tablet 3   atorvastatin (LIPITOR) 20 MG tablet TAKE 1 TABLET BY MOUTH ONCE  DAILY FOR CHOLESTEROL 90 tablet 2   Calcium-Magnesium-Vitamin D (CALCIUM 1200+D3 PO) Take 1 tablet by mouth daily.     Cholecalciferol (DIALYVITE VITAMIN D 5000) 125 MCG (5000 UT) capsule Take 5,000 Units by mouth daily.     cyanocobalamin 1000 MCG tablet Take 1,000 mcg by mouth daily. Vit B12     DULoxetine (CYMBALTA) 60 MG capsule Take 1 capsule (60 mg total) by mouth daily. 90 capsule  3   famotidine (PEPCID) 40 MG tablet Take 1 tablet (40 mg total) by mouth every evening. for heartburn. 90 tablet 0   FARXIGA 10 MG TABS tablet Take 10 mg by mouth every morning.     furosemide (LASIX) 20 MG tablet Take 1 tablet (20 mg total) by mouth daily as needed. 30 tablet 3   ibandronate (BONIVA) 150 MG tablet Take 1 tablet (150 mg total) by mouth every 30 (thirty) days. Take in the morning with a full glass of water, on an empty stomach, and do not take anything else by mouth or lie down for the next 30 min. 3 tablet 3   losartan (COZAAR) 100 MG tablet TAKE 1 TABLET BY MOUTH ONCE  DAILY FOR BLOOD PRESSURE 90 tablet 2   MAGnesium-Oxide 400 (240 Mg) MG tablet Take 1 tablet (400 mg total) by mouth daily. 90 tablet 0   omeprazole (PRILOSEC) 20 MG capsule Take 1 capsule (20 mg total) by mouth 2 (two) times daily before a meal for 30 days, THEN 1 capsule (20 mg total) daily. 90 capsule 3   ondansetron (ZOFRAN-ODT) 4 MG disintegrating tablet Take 1 tablet (4 mg total) by mouth every 8 (eight) hours as needed for nausea or vomiting. 20 tablet 0   oxyCODONE (ROXICODONE) 5 MG immediate release tablet Take 1 tablet (5 mg total) by mouth every 4 (four) hours as needed for severe pain. 30 tablet 0   tiZANidine (ZANAFLEX) 4 MG tablet Take 0.5-1 tablets (2-4 mg total) by mouth daily. 20 tablet 0   triamcinolone cream (KENALOG) 0.1 % Apply 1 Application topically daily as needed. Up to 5 days per week. Avoid face, groin, underarms. 80 g 3   Current Facility-Administered Medications  Medication Dose Route Frequency Provider Last Rate Last Admin   0.9 %  sodium chloride infusion  500 mL Intravenous Once Mansouraty, Netty Starring., MD        Allergies:   Alendronate and Cefaclor    Social History:  The patient  reports that she has quit smoking. Her smoking use included cigarettes. She started smoking about 23 years ago. She has a 50.3 pack-year smoking history. She has quit using smokeless tobacco. She  reports that she does not drink alcohol and does not use drugs.   Family History:  The patient's family history includes Heart disease in her mother.    ROS:  Please see the history of present illness.   Otherwise, review of systems are positive for none.   All other systems are reviewed and negative.    PHYSICAL EXAM: VS:  BP (!) 150/74 (BP Location: Left Arm, Patient Position: Sitting, Cuff Size: Normal)   Pulse 80   Ht 5\' 3"  (1.6 m)   Wt 162 lb 4 oz (73.6 kg)   SpO2 97%   BMI 28.74 kg/m  , BMI Body mass index is 28.74 kg/m. GEN: Well nourished, well developed, in no acute distress  HEENT: normal  Neck: no JVD, carotid bruits, or masses Cardiac: RRR; no murmurs, rubs, or gallops,no edema  Respiratory:  clear to auscultation bilaterally, normal work of breathing GI: soft, nontender, nondistended, + BS.  There is an abdominal bruit MS: no deformity or atrophy  Skin: warm and dry, no rash Neuro:  Strength and sensation are intact Psych: euthymic mood, full affect   EKG:  EKG is ordered today. The ekg ordered today demonstrates : Normal sinus rhythm Left axis deviation Pulmonary disease pattern Septal infarct , age undetermined Inferior infarct (cited on or before 03-May-2023) When compared with ECG of 03-May-2023 10:31, Premature ventricular complexes are no longer Present Premature atrial complexes are no longer Present    Recent Labs: 02/07/2023: B Natriuretic Peptide 309.1 04/25/2023: ALT 13; BUN 35; Creatinine, Ser 1.67; Hemoglobin 12.3; Platelets 178.0; Potassium 4.5; Sodium 143    Lipid Panel    Component Value Date/Time   CHOL 164 06/01/2022 0929   TRIG 82.0 06/01/2022 0929   HDL 84.60 06/01/2022 0929   CHOLHDL 2 06/01/2022 0929   VLDL 16.4 06/01/2022 0929   LDLCALC 63 06/01/2022 0929      Wt Readings from Last 3 Encounters:  06/07/23 162 lb 4 oz (73.6 kg)  05/11/23 160 lb (72.6 kg)  05/03/23 160 lb (72.6 kg)          No data to display             ASSESSMENT AND PLAN:  1.  Chronic mesenteric ischemia: Her symptoms are highly suspicious for chronic mesenteric ischemia even though she denies any abdominal pain.  However, she experiences nausea and occasional vomiting every time she eats and there has been aversion to food as a result with unintentional weight loss of 30 pounds since last year. She reports no significant improvement in her symptoms after esophageal dilatation last month. I am going to obtain mesenteric artery vascular ultrasound duplex.  I explained to her that most likely we will need to proceed with angiography and possible endovascular intervention but I am going to reach out to gastroenterology first to make sure there is no alternative etiology to explain her symptoms.  2.  Persistent atrial fibrillation: She reports recent breakthrough atrial fibrillation and has a follow-up appointment with the EP.  She is not on anticoagulation as she had a Watchman device placement.  3.  Hyperlipidemia: Continue atorvastatin with a target LDL of less than 70.  4.  Chronic kidney disease: Most recent creatinine was 1.67.    Disposition:   Follow-up with me after mesenteric duplex.  Signed,  Lorine Bears, MD  06/07/2023 10:26 AM    [facility]

## 2023-06-11 ENCOUNTER — Other Ambulatory Visit: Payer: Self-pay

## 2023-06-11 DIAGNOSIS — K219 Gastro-esophageal reflux disease without esophagitis: Secondary | ICD-10-CM

## 2023-06-11 MED ORDER — FAMOTIDINE 40 MG PO TABS
40.0000 mg | ORAL_TABLET | Freq: Every evening | ORAL | 0 refills | Status: DC
Start: 2023-06-11 — End: 2023-07-25

## 2023-06-13 NOTE — Progress Notes (Unsigned)
Cardiology Office Note Date:  06/13/2023  Patient ID:  Amy, Underwood [birth year], MRN [id number] PCP:  [name], NP  Cardiologist:  None Electrophysiologist: Lanier Prude, MD   Chief Complaint: afib follow-up  History of Present Illness: Amy Underwood is a 77 y.o. female with PMH notable for persis Afib, s/p LAAO with watchman, HFpEF, HTN; seen today for Lanier Prude, MD for acute visit due to afib.   She is s/p atypical clockwise ablation w CTI, AF ablation with PVI, Posterior wall on 01/2022.  She is s/p LAAO with watchman 04/2022. Tikosyn stopped 11/2022 d/t no AFib. She last saw Dr. Lalla Brothers 02/2023 was doing well off tikosyn.  I saw her 04/2023 for acute visit. She had been seen earlier by her GI for workup of unintentional weight loss and they noted an irregular heart rate. She was fairly certain she was not having afib because she previously was quite symptoamtic during afib episodes and she was not having any symptoms. Her apple watch was alerting her to AFib episodes, but she was ignroing the alerts  d/t lack of symptoms. Zio ordered at that time, that showed 80% afib/flutter burden.   On follow-up today,  *** AF burden, symptoms *** palpitations *** bleeding concerns  *** Bp readings at home  She checks BP at home, all readings 130s systolics. She    she denies chest pain, palpitations, dyspnea, PND, orthopnea, dizziness, syncope, edema.  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 Lalla Brothers   Psoriasis     Past Surgical History:  Procedure Laterality Date   ATRIAL FIBRILLATION ABLATION N/A 01/03/2022   Procedure: ATRIAL FIBRILLATION ABLATION;  Surgeon: Lanier Prude, MD;  Location: [facility];  Service: Cardiovascular;  Laterality: N/A;   CARPAL TUNNEL RELEASE Right 01/18/2023   Procedure: CARPAL TUNNEL RELEASE;  Surgeon: Jones Broom, MD;  Location: [facility];  Service: Orthopedics;  Laterality: Right;   CESAREAN SECTION     3   CHOLECYSTECTOMY     HIP ARTHROPLASTY Left    KNEE ARTHROPLASTY Bilateral    LEFT ATRIAL APPENDAGE OCCLUSION N/A 04/20/2022   Procedure: LEFT ATRIAL APPENDAGE OCCLUSION;  Surgeon: Lanier Prude, MD;  Location: [facility];  Service: Cardiovascular;  Laterality: N/A;   REVERSE SHOULDER ARTHROPLASTY Right 01/18/2023   Procedure: REVERSE SHOULDER ARTHROPLASTY;  Surgeon: Jones Broom, MD;  Location: [facility];  Service: Orthopedics;  Laterality: Right;   TEE WITHOUT CARDIOVERSION N/A 04/20/2022   Procedure: TRANSESOPHAGEAL ECHOCARDIOGRAM (TEE);  Surgeon: Lanier Prude, MD;  Location[facility] INVASIVE CV LAB;  Service: Cardiovascular;  Laterality: N/A;    Current Outpatient Medications  Medication Instructions   allopurinol (ZYLOPRIM) 100 mg, Oral, Daily at bedtime   amLODipine (NORVASC) 10 mg, Oral, Daily   atorvastatin (LIPITOR) 20 mg, Oral, Daily, for cholesterol.   Calcium-Magnesium-Vitamin D (CALCIUM 1200+D3 PO) 1 tablet, Oral, Daily   cyanocobalamin 1,000 mcg, Oral, Daily,  Vit B12   Dialyvite Vitamin D 5000 5,000 Units, Oral, Daily   DULoxetine (CYMBALTA) 60 mg, Oral, Daily   famotidine (PEPCID) 40 mg, Oral, Every evening, for heartburn.   Farxiga 10 mg, Oral, Every morning   furosemide (LASIX) 20 mg, Oral, Daily PRN   ibandronate (BONIVA) 150 mg, Oral, Every 30 days, Take in the morning with a full glass of water, on an empty stomach, and do not take anything else by mouth  or lie down for the next 30 min.   losartan (COZAAR) 100 mg, Oral, Daily, for blood pressure   magnesium oxide (MAGNESIUM-OXIDE) 400 mg, Oral, Daily   omeprazole (PRILOSEC) 20 MG capsule Take 1 capsule (20 mg total) by mouth 2 (two) times daily before a meal for 30 days, THEN 1 capsule (20 mg total) daily.   ondansetron (ZOFRAN-ODT) 4 mg, Oral, Every 8 hours PRN   Otezla 30 mg, Oral, 2 times daily   oxyCODONE (ROXICODONE) 5 mg, Oral, Every 4 hours PRN   tiZANidine (ZANAFLEX) 2-4 mg, Oral, Daily   triamcinolone cream (KENALOG) 0.1 % 1 Application, Topical, Daily PRN, Up to 5 days per week. Avoid face, groin, underarms.    Social History:  The patient  reports that she has quit smoking. Her smoking use included cigarettes. She started smoking about 23 years ago. She has a 50.3 pack-year smoking history. She has quit using smokeless tobacco. She reports that she does not drink alcohol and does not use drugs.   Family History:  The patient's family history includes Heart disease in her mother.  ROS:  Please see the history of present illness. All other systems are reviewed and otherwise negative.   PHYSICAL EXAM:  VS:  There were no vitals taken for this visit. BMI: There is no height or weight on file to calculate BMI.  There were no vitals filed for this visit.    GEN- The patient is well appearing, alert and oriented x 3 today.   Lungs- Clear to ausculation bilaterally, normal work of breathing.  Heart- Irregularly irregular rate and rhythm, no murmurs, rubs or gallops Extremities- No peripheral edema, warm, dry   EKG is not ordered. Personal review of EKG from today shows:         Recent Labs: 02/07/2023: B Natriuretic Peptide 309.1 04/25/2023: ALT 13; BUN 35; Creatinine, Ser 1.67; Hemoglobin 12.3; Platelets 178.0; Potassium 4.5; Sodium 143  No results found for requested labs within last 365 days.   CrCl cannot be calculated (Patient's most recent lab result is older than the  maximum 21 days allowed.).   Wt Readings from Last 3 Encounters:  06/07/23 162 lb 4 oz (73.6 kg)  05/11/23 160 lb (72.6 kg)  05/03/23 160 lb (72.6 kg)     Additional studies reviewed include: Previous EP, cardiology notes.   CT cardiac, 06/22/2022 1. Well placed 31 mm Watchman FLX with no leak, full endothelialization and average compression 18%  2.  Moderate bi atrial enlargement  3. 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  Normal ascending thoracic aorta 3.5 cm  5.  No perciardial effusion  6.  Normal PV anatomy measurements above  TTE, 10/27/2021  1. Left ventricular ejection fraction, by estimation, is 60 to 65%. Left ventricular ejection fraction by 2D MOD biplane is 63.8 %. The left ventricle has normal function. The left ventricle has no regional wall motion abnormalities. There is moderate left ventricular hypertrophy of the basal-septal segment. Left ventricular diastolic  parameters are consistent with Grade II diastolic dysfunction (pseudonormalization). The average left ventricular global longitudinal strain is -17.8 %. The global longitudinal strain is normal.   2. Right ventricular systolic function is normal. The right ventricular size is normal. There is normal pulmonary artery systolic pressure.   3. Left atrial size was mildly dilated.   4. The mitral valve is degenerative. Mild mitral valve regurgitation.   5. The aortic valve is calcified. Aortic valve regurgitation is mild. Mild aortic valve stenosis. Aortic valve mean gradient measures 8.0 mmHg.   6. The inferior vena cava is normal in size with greater than 50% respiratory variability, suggesting right atrial pressure of 3 mmHg.    ASSESSMENT AND PLAN:  #) parox AFib #) LAAO with Watchman No symptomatic Afib episodes Stopped tikosyn 11/2022 Today's EKG with PACs and PVCs 2 week monitor to further eval for AFib burden Watchman well-seated, no OAC indicated   #) HTN Elevated  in office on recheck, but normal readings at home No med changes, recommended she continue to monitor at home Continue amlodipine 10, losartan 100      Current medicines are reviewed at length with the patient today.   The patient does not have concerns regarding her medicines.  The following changes were made today:  none  Labs/ tests ordered today include:  No orders of the defined types were placed in this encounter.    Disposition: Follow up with EP APP  6 weels    Signed, Sherie Don, NP  06/13/23  7:21 PM  Electrophysiology [facility]

## 2023-06-14 ENCOUNTER — Encounter: Payer: Self-pay | Admitting: Cardiology

## 2023-06-14 ENCOUNTER — Ambulatory Visit: Payer: Medicare Other | Attending: Cardiology | Admitting: Cardiology

## 2023-06-14 VITALS — BP 140/70 | HR 82 | Ht 63.0 in | Wt 159.0 lb

## 2023-06-14 DIAGNOSIS — Z95818 Presence of other cardiac implants and grafts: Secondary | ICD-10-CM | POA: Diagnosis not present

## 2023-06-14 DIAGNOSIS — I480 Paroxysmal atrial fibrillation: Secondary | ICD-10-CM

## 2023-06-14 DIAGNOSIS — I10 Essential (primary) hypertension: Secondary | ICD-10-CM | POA: Diagnosis not present

## 2023-06-14 MED ORDER — AMIODARONE HCL 200 MG PO TABS: ORAL_TABLET | ORAL | 0 refills | Status: DC

## 2023-06-14 NOTE — Patient Instructions (Addendum)
Medication Instructions:   START Amiodarone 200 mg tablet by mouth 2 tablets (400 mg) daily for 2 weeks,  then Take Amiodarone 1 tablet (200 mg) tablet by mouth once daily.  *If you need a refill on your cardiac medications before your next appointment, please call your pharmacy*   Lab Work:  Your provider would like for you have the following labs drawn: TODAY.  TSH/T4   Please go to [facility] [address] ([facility] [address], [place] [address] You do not need an appointment.  They are open from 7:30 am-4 pm.  Lunch from 1:00 pm- 2:00 pm   If you have labs (blood work) drawn today and your tests are completely normal, you will receive your results only by: MyChart Message (if you have MyChart) OR A paper copy in the mail If you have any lab test that is abnormal or we need to change your treatment, we will call you to review the results.   Testing/Procedures: NONE   Follow-Up: At [facility], you and your health needs are our priority.  As part of our continuing mission to provide you with exceptional heart care, we have created designated Provider Care Teams.  These Care Teams include your primary Cardiologist (physician) and Advanced Practice Providers (APPs -  Physician Assistants and Nurse Practitioners) who all work together to provide you with the care you need, when you need it.  We recommend signing up for the patient portal called "MyChart".  Sign up information is provided on this After Visit Summary.  MyChart is used to connect with patients for Virtual Visits (Telemedicine).  Patients are able to view lab/test results, encounter notes, upcoming appointments, etc.  Non-urgent messages can be sent to your provider as well.   To learn more about what you can do with MyChart, go to [email or web address].    Your next appointment:    Keep follow up appointment  Provider:   Sherie Don, NP    Other Instructions  Please check  and log your Blood pressure and heart rate and bring with your next office visit.

## 2023-06-15 LAB — TSH: TSH: 1.14 u[IU]/mL (ref 0.450–4.500)

## 2023-06-15 LAB — T4: T4, Total: 6.8 ug/dL (ref 4.5–12.0)

## 2023-06-18 ENCOUNTER — Telehealth: Payer: Self-pay | Admitting: Primary Care

## 2023-06-18 DIAGNOSIS — I10 Essential (primary) hypertension: Secondary | ICD-10-CM

## 2023-06-18 DIAGNOSIS — E782 Mixed hyperlipidemia: Secondary | ICD-10-CM

## 2023-06-18 NOTE — Telephone Encounter (Signed)
Prescription Request  06/18/2023  LOV: 04/03/2023  What is the name of the medication or equipment? atorvastatin (LIPITOR) 20 MG tablet   losartan (COZAAR) 100 MG tablet   Have you contacted your pharmacy to request a refill? Yes   Which pharmacy would you like this sent to?   [facility] - [place], [place] - [address] [place]  [address] Phone: [phone] Fax: [phone]      Patient notified that their request is being sent to the clinical staff for review and that they should receive a response within 2 business days.   Please advise at [facility] [phone]

## 2023-06-19 MED ORDER — ATORVASTATIN CALCIUM 20 MG PO TABS: 20.0000 mg | ORAL_TABLET | Freq: Every day | ORAL | 1 refills | Status: DC

## 2023-06-19 MED ORDER — LOSARTAN POTASSIUM 100 MG PO TABS: 100.0000 mg | ORAL_TABLET | Freq: Every day | ORAL | 1 refills | Status: DC

## 2023-06-19 NOTE — Telephone Encounter (Signed)
Refills sent to pharmacy. 

## 2023-06-28 ENCOUNTER — Ambulatory Visit: Payer: Medicare Other | Attending: Cardiovascular Disease

## 2023-06-28 DIAGNOSIS — K551 Chronic vascular disorders of intestine: Secondary | ICD-10-CM | POA: Diagnosis not present

## 2023-07-03 ENCOUNTER — Other Ambulatory Visit: Payer: Self-pay | Admitting: Cardiology

## 2023-07-05 ENCOUNTER — Encounter: Payer: Self-pay | Admitting: Cardiovascular Disease

## 2023-07-05 ENCOUNTER — Ambulatory Visit: Payer: Medicare Other | Attending: Cardiovascular Disease | Admitting: Cardiovascular Disease

## 2023-07-05 VITALS — BP 140/60 | HR 65 | Ht 63.0 in | Wt 159.0 lb

## 2023-07-05 DIAGNOSIS — I4819 Other persistent atrial fibrillation: Secondary | ICD-10-CM

## 2023-07-05 DIAGNOSIS — E785 Hyperlipidemia, unspecified: Secondary | ICD-10-CM | POA: Diagnosis not present

## 2023-07-05 DIAGNOSIS — K551 Chronic vascular disorders of intestine: Secondary | ICD-10-CM

## 2023-07-05 DIAGNOSIS — I10 Essential (primary) hypertension: Secondary | ICD-10-CM

## 2023-07-05 MED ORDER — ASPIRIN 81 MG PO TBEC
81.0000 mg | DELAYED_RELEASE_TABLET | Freq: Every day | ORAL | Status: AC
Start: 2023-07-05 — End: ?

## 2023-07-05 NOTE — H&P (View-Only) (Signed)
Cardiology Office Note   Date:  07/05/2023   ID:  Amy Underwood, DOB 1945-09-14, MRN [id number]  PCP:  [name], NP  Cardiologist:   Lorine Bears, MD   Chief Complaint  Patient presents with   Follow up Mesentreric [place]     "Doing well." Medications reviewed by the patient verbally.       History of Present Illness: Amy Underwood is a 77 y.o. female who is here today for a follow-up visit regarding  chronic mesenteric ischemia. She has history of persistent atrial fibrillation, Watchman device placement, chronic diastolic heart failure, chronic kidney disease, gastric bypass surgery and essential hypertension.  She has been having postprandial nausea and occasional vomiting with 30 pounds weight loss since last year which has been unintentional.  She reports fear of eating due to her GI symptoms.  She denies abdominal pain per se.  She had CT abdomen done in July which was personally reviewed by me.  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  The study was not a dedicated CTA study. She has been seen by Dr. Meridee Score and gastroenterology and had an EGD done which was unremarkable.  She did undergo esophageal dilatation last month but she reports no significant change in her symptoms. 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  Her symptoms are still the same and unchanged.  She is having hard time taking her medications due to nausea.  Past Medical History:  Diagnosis Date   Adhesive capsulitis of right shoulder    Asthma    Benign hypertensive kidney disease with chronic kidney disease 07/11/2021   Bilateral hand numbness 08/08/2022   Hyperlipidemia    Hypertension    Iron deficiency anemia    Leukopenia 09/23/2014   Osteoarthritis     Osteoarthritis of hip 09/23/2014   Paroxysmal atrial fibrillation (HCC)    Pernicious anemia    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   Watchman FLX 31mm with Dr. Lalla Brothers   Psoriasis     Past Surgical History:  Procedure Laterality Date   ATRIAL FIBRILLATION ABLATION N/A 01/03/2022   Procedure: ATRIAL FIBRILLATION ABLATION;  Surgeon: Lanier Prude, MD;  Location: [facility];  Service: Cardiovascular;  Laterality: N/A;   CARPAL TUNNEL RELEASE Right 01/18/2023   Procedure: CARPAL TUNNEL RELEASE;  Surgeon: Jones Broom, MD;  Location: [facility];  Service: Orthopedics;  Laterality: Right;   CESAREAN SECTION     3   CHOLECYSTECTOMY     HIP ARTHROPLASTY Left    KNEE ARTHROPLASTY Bilateral    LEFT ATRIAL APPENDAGE OCCLUSION N/A 04/20/2022   Procedure: LEFT ATRIAL APPENDAGE OCCLUSION;  Surgeon: Lanier Prude, MD;  Location: [facility];  Service: Cardiovascular;  Laterality: N/A;   REVERSE SHOULDER ARTHROPLASTY Right 01/18/2023   Procedure: REVERSE SHOULDER ARTHROPLASTY;  Surgeon: Jones Broom, MD;  Location: [facility];  Service: Orthopedics;  Laterality: Right;   TEE WITHOUT CARDIOVERSION N/A 04/20/2022   Procedure: TRANSESOPHAGEAL ECHOCARDIOGRAM (TEE);  Surgeon: Lanier Prude, MD;  Location: [facility] INVASIVE CV LAB;  Service: Cardiovascular;  Laterality: N/A;     Current Outpatient Medications  Medication Sig Dispense Refill   allopurinol (ZYLOPRIM) 100 MG tablet Take 100 mg by mouth at bedtime.  amiodarone (PACERONE) 200 MG tablet Take 2 tablets (400 mg) by mouth once daily for 2 weeks, Then Take 1 tablet by mouth daily. 45 tablet 0   amiodarone (PACERONE) 200 MG tablet Take 200 mg by mouth daily.     amLODipine (NORVASC) 10 MG tablet Take 1 tablet (10 mg total) by mouth daily. 180 tablet 3   Apremilast (OTEZLA) 30 MG TABS Take 1 tablet (30 mg total) by mouth 2 (two) times daily. 180 tablet 3   aspirin EC 81 MG tablet Take 1 tablet  (81 mg total) by mouth daily. Swallow whole.     atorvastatin (LIPITOR) 20 MG tablet Take 1 tablet (20 mg total) by mouth daily. for cholesterol. 90 tablet 1   Calcium-Magnesium-Vitamin D (CALCIUM 1200+D3 PO) Take 1 tablet by mouth daily.     Cholecalciferol (DIALYVITE VITAMIN D 5000) 125 MCG (5000 UT) capsule Take 5,000 Units by mouth daily.     cyanocobalamin 1000 MCG tablet Take 1,000 mcg by mouth daily. Vit B12     DULoxetine (CYMBALTA) 60 MG capsule Take 1 capsule (60 mg total) by mouth daily. 90 capsule 3   famotidine (PEPCID) 40 MG tablet Take 1 tablet (40 mg total) by mouth every evening. for heartburn. 90 tablet 0   FARXIGA 10 MG TABS tablet Take 10 mg by mouth every morning.     furosemide (LASIX) 20 MG tablet TAKE 1 TABLET(20 MG) BY MOUTH DAILY AS NEEDED 90 tablet 3   losartan (COZAAR) 100 MG tablet Take 1 tablet (100 mg total) by mouth daily. for blood pressure 90 tablet 1   MAGnesium-Oxide 400 (240 Mg) MG tablet Take 1 tablet (400 mg total) by mouth daily. 90 tablet 0   omeprazole (PRILOSEC) 20 MG capsule Take 20 mg by mouth daily.     ibandronate (BONIVA) 150 MG tablet Take 1 tablet (150 mg total) by mouth every 30 (thirty) days. Take in the morning with a full glass of water, on an empty stomach, and do not take anything else by mouth or lie down for the next 30 min. (Patient not taking: Reported on 06/14/2023) 3 tablet 3   ondansetron (ZOFRAN-ODT) 4 MG disintegrating tablet Take 1 tablet (4 mg total) by mouth every 8 (eight) hours as needed for nausea or vomiting. (Patient not taking: Reported on 07/05/2023) 20 tablet 0   oxyCODONE (ROXICODONE) 5 MG immediate release tablet Take 1 tablet (5 mg total) by mouth every 4 (four) hours as needed for severe pain. (Patient not taking: Reported on 07/05/2023) 30 tablet 0   tiZANidine (ZANAFLEX) 4 MG tablet Take 0.5-1 tablets (2-4 mg total) by mouth daily. (Patient not taking: Reported on 06/14/2023) 20 tablet 0   triamcinolone cream (KENALOG)  0.1 % Apply 1 Application topically daily as needed. Up to 5 days per week. Avoid face, groin, underarms. (Patient not taking: Reported on 06/14/2023) 80 g 3   No current facility-administered medications for this visit.    Allergies:   Alendronate and Cefaclor    Social History:  The patient  reports that she has quit smoking. Her smoking use included cigarettes. She started smoking about 23 years ago. She has a 50.5 pack-year smoking history. She has quit using smokeless tobacco. She reports that she does not drink alcohol and does not use drugs.   Family History:  The patient's family history includes Heart disease in her mother.    ROS:  Please see the history of present illness.   Otherwise, review of systems  are positive for none.   All other systems are reviewed and negative.    PHYSICAL EXAM: VS:  BP (!) 140/60 (BP Location: Left Arm, Patient Position: Sitting, Cuff Size: Normal)   Pulse 65   Ht 5\' 3"  (1.6 m)   Wt 159 lb (72.1 kg)   SpO2 98%   BMI 28.17 kg/m  , BMI Body mass index is 28.17 kg/m. GEN: Well nourished, well developed, in no acute distress  HEENT: normal  Neck: no JVD, carotid bruits, or masses Cardiac: RRR; no  rubs, or gallops,no edema .  2 out of 6 systolic murmur in the aortic area. Respiratory:  clear to auscultation bilaterally, normal work of breathing GI: soft, nontender, nondistended, + BS.  There is an abdominal bruit MS: no deformity or atrophy  Skin: warm and dry, no rash Neuro:  Strength and sensation are intact Psych: euthymic mood, full affect Right femoral pulse is normal.   EKG:  EKG is ordered today. The ekg ordered today demonstrates : Normal sinus rhythm Left axis deviation Septal infarct (cited on or before 03-May-2023) When compared with ECG of 07-Jun-2023 10:21, Criteria for Inferior infarct are no longer Present   Recent Labs: 02/07/2023: B Natriuretic Peptide 309.1 04/25/2023: ALT 13; BUN 35; Creatinine, Ser 1.67; Hemoglobin  12.3; Platelets 178.0; Potassium 4.5; Sodium 143 06/14/2023: TSH 1.140    Lipid Panel    Component Value Date/Time   CHOL 164 06/01/2022 0929   TRIG 82.0 06/01/2022 0929   HDL 84.60 06/01/2022 0929   CHOLHDL 2 06/01/2022 0929   VLDL 16.4 06/01/2022 0929   LDLCALC 63 06/01/2022 0929      Wt Readings from Last 3 Encounters:  07/05/23 159 lb (72.1 kg)  06/14/23 159 lb (72.1 kg)  06/07/23 162 lb 4 oz (73.6 kg)          No data to display            ASSESSMENT AND PLAN:  1.  Chronic mesenteric ischemia: I think her symptoms are consistent with chronic mesenteric ischemia in the presence of severe SMA stenosis and likely borderline stenosis in the celiac trunk.  There has been no other explanation for her symptoms.  She continues to lose weight and has hard time tolerating food and even her medications.  Due to that, I recommend proceeding with visceral angiography and possible endovascular intervention.  I discussed the procedure in details as well as risk and benefits.  Planned access is via the right common femoral artery.  Start aspirin 81 mg once daily.    2.  Persistent atrial fibrillation: She had recent breakthrough atrial fibrillation which was symptomatic and was started on amiodarone.    She is not on anticoagulation as she had a Watchman device placement.  3.  Hyperlipidemia: Continue atorvastatin with a target LDL of less than 70.  4.  Chronic kidney disease: Recent labs showed some improvement in renal function with creatinine of 1.4 with a GFR of 39.  Will hydrate 4 hours before angiogram.    Disposition: Proceed with visceral angiography and follow-up after.   Signed,  Lorine Bears, MD  07/05/2023 9:58 AM    [facility]

## 2023-07-05 NOTE — Patient Instructions (Signed)
Medication Instructions:  START Aspirin 81 mg once daily  *If you need a refill on your cardiac medications before your next appointment, please call your pharmacy*   Lab Work: Your provider would like for you to have following labs drawn today CBC and BMET.   If you have labs (blood work) drawn today and your tests are completely normal, you will receive your results only by: MyChart Message (if you have MyChart) OR A paper copy in the mail If you have any lab test that is abnormal or we need to change your treatment, we will call you to review the results.   Testing/Procedures: Your physician has requested that you have a peripheral vascular angiogram. This exam is performed at the hospital. During this exam IV contrast is used to look at arterial blood flow. Please review the information sheet given for details.    Follow-Up: At [facility], you and your health needs are our priority.  As part of our continuing mission to provide you with exceptional heart care, we have created designated Provider Care Teams.  These Care Teams include your primary Cardiologist (physician) and Advanced Practice Providers (APPs -  Physician Assistants and Nurse Practitioners) who all work together to provide you with the care you need, when you need it.  We recommend signing up for the patient portal called "MyChart".  Sign up information is provided on this After Visit Summary.  MyChart is used to connect with patients for Virtual Visits (Telemedicine).  Patients are able to view lab/test results, encounter notes, upcoming appointments, etc.  Non-urgent messages can be sent to your provider as well.   To learn more about what you can do with MyChart, go to [email or web address].    Your next appointment:   3 week(s) from 07/18/23  Provider:   You may see Dr. Kirke Corin or one of the following Advanced Practice Providers on your designated Care Team:   Nicolasa Ducking, NP Eula Listen,  PA-C Cadence Fransico Michael, PA-C Charlsie Quest, NP    Other Instructions  [facility] A DEPT OF [facility]. [facility] AT [facility] [address] [place] [address] Dept: [phone] Loc: [phone]  Ceil Blauer  07/05/2023  You are scheduled for a Peripheral Angiogram on Wednesday, November 13 with Dr. Lorine Bears.  1. Please arrive at the [facility] (Main Entrance A) at [facility]: [address] [place], [place] [address] at 6:30 AM (This time is 4 hour(s) before your procedure to ensure your preparation). Free valet parking service is available. You will check in at ADMITTING. The support person will be asked to wait in the waiting room.  It is OK to have someone drop you off and come back when you are ready to be discharged.    Special note: Every effort is made to have your procedure done on time. Please understand that emergencies sometimes delay scheduled procedures.  2. Diet: Do not eat solid foods after midnight.  The patient may have clear liquids until 5am upon the day of the procedure.  3. Labs: You will need to have blood drawn on 07/05/23. You do not need to be fasting.  4. Medication instructions in preparation for your procedure: Hold the furosemide the morning of the procedure   On the morning of your procedure, take your Aspirin 81 mg and any morning medicines NOT listed above.  You may use sips of water.  5. Plan to go home the  same day, you will only stay overnight if medically necessary. 6. Bring a current list of your medications and current insurance cards. 7. You MUST have a responsible person to drive you home. 8. Someone MUST be with you the first 24 hours after you arrive home or your discharge will be delayed. 9. Please wear clothes that are easy to get on and off and wear slip-on shoes.  Thank you for allowing [place] to care for you!   -- [facility]

## 2023-07-05 NOTE — Progress Notes (Signed)
Cardiology Office Note   Date:  07/05/2023   ID:  Amy Underwood, DOB 1945-09-14, MRN [id number]  PCP:  Amy Nest, NP  Cardiologist:   Amy Bears, MD   Chief Complaint  Patient presents with   Follow up Mesentreric [place]     "Doing well." Medications reviewed by the patient verbally.       History of Present Illness: Amy Underwood is a 77 y.o. female who is here today for a follow-up visit regarding  chronic mesenteric ischemia. She has history of persistent atrial fibrillation, Watchman device placement, chronic diastolic heart failure, chronic kidney disease, gastric bypass surgery and essential hypertension.  She has been having postprandial nausea and occasional vomiting with 30 pounds weight loss since last year which has been unintentional.  She reports fear of eating due to her GI symptoms.  She denies abdominal pain per se.  She had CT abdomen done in July which was personally reviewed by me.  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  The study was not a dedicated CTA study. She has been seen by Amy Underwood and gastroenterology and had an EGD done which was unremarkable.  She did undergo esophageal dilatation last month but she reports no significant change in her symptoms. 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  Her symptoms are still the same and unchanged.  She is having hard time taking her medications due to nausea.  Past Medical History:  Diagnosis Date   Adhesive capsulitis of right shoulder    Asthma    Benign hypertensive kidney disease with chronic kidney disease 07/11/2021   Bilateral hand numbness 08/08/2022   Hyperlipidemia    Hypertension    Iron deficiency anemia    Leukopenia 09/23/2014   Osteoarthritis     Osteoarthritis of hip 09/23/2014   Paroxysmal atrial fibrillation (HCC)    Pernicious anemia    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   Watchman FLX 31mm with Dr. Lalla Brothers   Psoriasis     Past Surgical History:  Procedure Laterality Date   ATRIAL FIBRILLATION ABLATION N/A 01/03/2022   Procedure: ATRIAL FIBRILLATION ABLATION;  Surgeon: Amy Prude, MD;  Location: [facility];  Service: Cardiovascular;  Laterality: N/A;   CARPAL TUNNEL RELEASE Right 01/18/2023   Procedure: CARPAL TUNNEL RELEASE;  Surgeon: Amy Broom, MD;  Location: [facility];  Service: Orthopedics;  Laterality: Right;   CESAREAN SECTION     3   CHOLECYSTECTOMY     HIP ARTHROPLASTY Left    KNEE ARTHROPLASTY Bilateral    LEFT ATRIAL APPENDAGE OCCLUSION N/A 04/20/2022   Procedure: LEFT ATRIAL APPENDAGE OCCLUSION;  Surgeon: Amy Prude, MD;  Location: [facility];  Service: Cardiovascular;  Laterality: N/A;   REVERSE SHOULDER ARTHROPLASTY Right 01/18/2023   Procedure: REVERSE SHOULDER ARTHROPLASTY;  Surgeon: Amy Broom, MD;  Location: [facility];  Service: Orthopedics;  Laterality: Right;   TEE WITHOUT CARDIOVERSION N/A 04/20/2022   Procedure: TRANSESOPHAGEAL ECHOCARDIOGRAM (TEE);  Surgeon: Amy Prude, MD;  Location: [facility] INVASIVE CV LAB;  Service: Cardiovascular;  Laterality: N/A;     Current Outpatient Medications  Medication Sig Dispense Refill   allopurinol (ZYLOPRIM) 100 MG tablet Take 100 mg by mouth at bedtime.  amiodarone (PACERONE) 200 MG tablet Take 2 tablets (400 mg) by mouth once daily for 2 weeks, Then Take 1 tablet by mouth daily. 45 tablet 0   amiodarone (PACERONE) 200 MG tablet Take 200 mg by mouth daily.     amLODipine (NORVASC) 10 MG tablet Take 1 tablet (10 mg total) by mouth daily. 180 tablet 3   Apremilast (OTEZLA) 30 MG TABS Take 1 tablet (30 mg total) by mouth 2 (two) times daily. 180 tablet 3   aspirin EC 81 MG tablet Take 1 tablet  (81 mg total) by mouth daily. Swallow whole.     atorvastatin (LIPITOR) 20 MG tablet Take 1 tablet (20 mg total) by mouth daily. for cholesterol. 90 tablet 1   Calcium-Magnesium-Vitamin D (CALCIUM 1200+D3 PO) Take 1 tablet by mouth daily.     Cholecalciferol (DIALYVITE VITAMIN D 5000) 125 MCG (5000 UT) capsule Take 5,000 Units by mouth daily.     cyanocobalamin 1000 MCG tablet Take 1,000 mcg by mouth daily. Vit B12     DULoxetine (CYMBALTA) 60 MG capsule Take 1 capsule (60 mg total) by mouth daily. 90 capsule 3   famotidine (PEPCID) 40 MG tablet Take 1 tablet (40 mg total) by mouth every evening. for heartburn. 90 tablet 0   FARXIGA 10 MG TABS tablet Take 10 mg by mouth every morning.     furosemide (LASIX) 20 MG tablet TAKE 1 TABLET(20 MG) BY MOUTH DAILY AS NEEDED 90 tablet 3   losartan (COZAAR) 100 MG tablet Take 1 tablet (100 mg total) by mouth daily. for blood pressure 90 tablet 1   MAGnesium-Oxide 400 (240 Mg) MG tablet Take 1 tablet (400 mg total) by mouth daily. 90 tablet 0   omeprazole (PRILOSEC) 20 MG capsule Take 20 mg by mouth daily.     ibandronate (BONIVA) 150 MG tablet Take 1 tablet (150 mg total) by mouth every 30 (thirty) days. Take in the morning with a full glass of water, on an empty stomach, and do not take anything else by mouth or lie down for the next 30 min. (Patient not taking: Reported on 06/14/2023) 3 tablet 3   ondansetron (ZOFRAN-ODT) 4 MG disintegrating tablet Take 1 tablet (4 mg total) by mouth every 8 (eight) hours as needed for nausea or vomiting. (Patient not taking: Reported on 07/05/2023) 20 tablet 0   oxyCODONE (ROXICODONE) 5 MG immediate release tablet Take 1 tablet (5 mg total) by mouth every 4 (four) hours as needed for severe pain. (Patient not taking: Reported on 07/05/2023) 30 tablet 0   tiZANidine (ZANAFLEX) 4 MG tablet Take 0.5-1 tablets (2-4 mg total) by mouth daily. (Patient not taking: Reported on 06/14/2023) 20 tablet 0   triamcinolone cream (KENALOG)  0.1 % Apply 1 Application topically daily as needed. Up to 5 days per week. Avoid face, groin, underarms. (Patient not taking: Reported on 06/14/2023) 80 g 3   No current facility-administered medications for this visit.    Allergies:   Alendronate and Cefaclor    Social History:  The patient  reports that she has quit smoking. Her smoking use included cigarettes. She started smoking about 23 years ago. She has a 50.5 pack-year smoking history. She has quit using smokeless tobacco. She reports that she does not drink alcohol and does not use drugs.   Family History:  The patient's family history includes Heart disease in her mother.    ROS:  Please see the history of present illness.   Otherwise, review of systems  are positive for none.   All other systems are reviewed and negative.    PHYSICAL EXAM: VS:  BP (!) 140/60 (BP Location: Left Arm, Patient Position: Sitting, Cuff Size: Normal)   Pulse 65   Ht 5\' 3"  (1.6 m)   Wt 159 lb (72.1 kg)   SpO2 98%   BMI 28.17 kg/m  , BMI Body mass index is 28.17 kg/m. GEN: Well nourished, well developed, in no acute distress  HEENT: normal  Neck: no JVD, carotid bruits, or masses Cardiac: RRR; no  rubs, or gallops,no edema .  2 out of 6 systolic murmur in the aortic area. Respiratory:  clear to auscultation bilaterally, normal work of breathing GI: soft, nontender, nondistended, + BS.  There is an abdominal bruit MS: no deformity or atrophy  Skin: warm and dry, no rash Neuro:  Strength and sensation are intact Psych: euthymic mood, full affect Right femoral pulse is normal.   EKG:  EKG is ordered today. The ekg ordered today demonstrates : Normal sinus rhythm Left axis deviation Septal infarct (cited on or before 03-May-2023) When compared with ECG of 07-Jun-2023 10:21, Criteria for Inferior infarct are no longer Present   Recent Labs: 02/07/2023: B Natriuretic Peptide 309.1 04/25/2023: ALT 13; BUN 35; Creatinine, Ser 1.67; Hemoglobin  12.3; Platelets 178.0; Potassium 4.5; Sodium 143 06/14/2023: TSH 1.140    Lipid Panel    Component Value Date/Time   CHOL 164 06/01/2022 0929   TRIG 82.0 06/01/2022 0929   HDL 84.60 06/01/2022 0929   CHOLHDL 2 06/01/2022 0929   VLDL 16.4 06/01/2022 0929   LDLCALC 63 06/01/2022 0929      Wt Readings from Last 3 Encounters:  07/05/23 159 lb (72.1 kg)  06/14/23 159 lb (72.1 kg)  06/07/23 162 lb 4 oz (73.6 kg)          No data to display            ASSESSMENT AND PLAN:  1.  Chronic mesenteric ischemia: I think her symptoms are consistent with chronic mesenteric ischemia in the presence of severe SMA stenosis and likely borderline stenosis in the celiac trunk.  There has been no other explanation for her symptoms.  She continues to lose weight and has hard time tolerating food and even her medications.  Due to that, I recommend proceeding with visceral angiography and possible endovascular intervention.  I discussed the procedure in details as well as risk and benefits.  Planned access is via the right common femoral artery.  Start aspirin 81 mg once daily.    2.  Persistent atrial fibrillation: She had recent breakthrough atrial fibrillation which was symptomatic and was started on amiodarone.    She is not on anticoagulation as she had a Watchman device placement.  3.  Hyperlipidemia: Continue atorvastatin with a target LDL of less than 70.  4.  Chronic kidney disease: Recent labs showed some improvement in renal function with creatinine of 1.4 with a GFR of 39.  Will hydrate 4 hours before angiogram.    Disposition: Proceed with visceral angiography and follow-up after.   Signed,  Amy Bears, MD  07/05/2023 9:58 AM    Amy Underwood

## 2023-07-06 LAB — CBC
Hematocrit: 37.1 % (ref 34.0–46.6)
Hemoglobin: 12 g/dL (ref 11.1–15.9)
MCH: 29.6 pg (ref 26.6–33.0)
MCHC: 32.3 g/dL (ref 31.5–35.7)
MCV: 91 fL (ref 79–97)
Platelets: 181 10*3/uL (ref 150–450)
RBC: 4.06 x10E6/uL (ref 3.77–5.28)
RDW: 13.1 % (ref 11.7–15.4)
WBC: 4.4 10*3/uL (ref 3.4–10.8)

## 2023-07-06 LAB — BASIC METABOLIC PANEL
BUN/Creatinine Ratio: 18 (ref 12–28)
BUN: 25 mg/dL (ref 8–27)
CO2: 23 mmol/L (ref 20–29)
Calcium: 9.6 mg/dL (ref 8.7–10.3)
Chloride: 106 mmol/L (ref 96–106)
Creatinine, Ser: 1.4 mg/dL — ABNORMAL HIGH (ref 0.57–1.00)
Glucose: 94 mg/dL (ref 70–99)
Potassium: 4.7 mmol/L (ref 3.5–5.2)
Sodium: 145 mmol/L — ABNORMAL HIGH (ref 134–144)
eGFR: 39 mL/min/{1.73_m2} — ABNORMAL LOW (ref >59)

## 2023-07-10 ENCOUNTER — Other Ambulatory Visit: Payer: Self-pay | Admitting: Cardiology

## 2023-07-16 ENCOUNTER — Other Ambulatory Visit: Payer: Self-pay | Admitting: Cardiology

## 2023-07-16 NOTE — Telephone Encounter (Signed)
Please advise if ok to refill magnesium.

## 2023-07-17 ENCOUNTER — Telehealth: Payer: Self-pay | Admitting: *Deleted

## 2023-07-17 NOTE — Telephone Encounter (Signed)
Mesenteric angiography scheduled at [facility] for: Wednesday July 18, 2023 10:30 AM Arrival time [facility] Main Entrance A at: 6 AM -pre-procedure hydration  Nothing to eat after midnight prior to procedure, clear liquids until 5 AM day of procedure.  Medication instructions: -Hold:  Farxiga-AM of procedure  Lasix-day before and day of procedure -per protocol GFR < 60 (39)  Losartan-day before and day of procedure-per protocol GFR <60 (39) pt already taken today -Other usual morning medications can be taken with sips of water including aspirin 81 mg.  Plan to go home the same day, you will only stay overnight if medically necessary.  You must have responsible adult to drive you home.  Someone must be with you the first 24 hours after you arrive home.  Reviewed procedure instructions with patient.

## 2023-07-18 ENCOUNTER — Other Ambulatory Visit: Payer: Self-pay

## 2023-07-18 ENCOUNTER — Ambulatory Visit (HOSPITAL_COMMUNITY)
Admission: RE | Admit: 2023-07-18 | Discharge: 2023-07-19 | Disposition: A | Discharge status: Home / Self Care | Payer: Medicare Other | Attending: Cardiovascular Disease | Admitting: Cardiovascular Disease

## 2023-07-18 ENCOUNTER — Other Ambulatory Visit: Payer: Self-pay | Admitting: *Deleted

## 2023-07-18 ENCOUNTER — Ambulatory Visit (HOSPITAL_COMMUNITY)
Admission: RE | Disposition: A | Discharge status: Home / Self Care | Payer: Self-pay | Source: Home / Self Care | Attending: Cardiovascular Disease

## 2023-07-18 DIAGNOSIS — Z9884 Bariatric surgery status: Secondary | ICD-10-CM | POA: Insufficient documentation

## 2023-07-18 DIAGNOSIS — I5032 Chronic diastolic (congestive) heart failure: Secondary | ICD-10-CM | POA: Diagnosis not present

## 2023-07-18 DIAGNOSIS — I4819 Other persistent atrial fibrillation: Secondary | ICD-10-CM | POA: Diagnosis not present

## 2023-07-18 DIAGNOSIS — Z79899 Other long term (current) drug therapy: Secondary | ICD-10-CM | POA: Diagnosis not present

## 2023-07-18 DIAGNOSIS — I700 Atherosclerosis of aorta: Secondary | ICD-10-CM | POA: Diagnosis not present

## 2023-07-18 DIAGNOSIS — I130 Hypertensive heart and chronic kidney disease with heart failure and stage 1 through stage 4 chronic kidney disease, or unspecified chronic kidney disease: Secondary | ICD-10-CM | POA: Diagnosis not present

## 2023-07-18 DIAGNOSIS — Z87891 Personal history of nicotine dependence: Secondary | ICD-10-CM | POA: Insufficient documentation

## 2023-07-18 DIAGNOSIS — E782 Mixed hyperlipidemia: Secondary | ICD-10-CM

## 2023-07-18 DIAGNOSIS — N189 Chronic kidney disease, unspecified: Secondary | ICD-10-CM | POA: Insufficient documentation

## 2023-07-18 DIAGNOSIS — K551 Chronic vascular disorders of intestine: Secondary | ICD-10-CM | POA: Diagnosis present

## 2023-07-18 DIAGNOSIS — Z95818 Presence of other cardiac implants and grafts: Secondary | ICD-10-CM | POA: Diagnosis not present

## 2023-07-18 DIAGNOSIS — E785 Hyperlipidemia, unspecified: Secondary | ICD-10-CM | POA: Insufficient documentation

## 2023-07-18 HISTORY — PX: VISCERAL ANGIOGRAPHY: CATH118276

## 2023-07-18 HISTORY — PX: PERIPHERAL VASCULAR INTERVENTION: CATH118257

## 2023-07-18 LAB — POCT ACTIVATED CLOTTING TIME
Activated Clotting Time: 221 s
Activated Clotting Time: 233 s

## 2023-07-18 SURGERY — VISCERAL ANGIOGRAPHY
Anesthesia: LOCAL

## 2023-07-18 MED ORDER — PROTAMINE SULFATE 10 MG/ML IV SOLN
INTRAVENOUS | Status: AC
  Filled 2023-07-18: qty 5

## 2023-07-18 MED ORDER — APREMILAST 30 MG PO TABS: 30.0000 mg | ORAL_TABLET | Freq: Two times a day (BID) | ORAL | Status: DC

## 2023-07-18 MED ORDER — ASPIRIN 81 MG PO TBEC
81.0000 mg | DELAYED_RELEASE_TABLET | Freq: Every day | ORAL | Status: DC
  Administered 2023-07-19: 81 mg via ORAL
  Filled 2023-07-18: qty 1

## 2023-07-18 MED ORDER — HEPARIN SODIUM (PORCINE) 1000 UNIT/ML IJ SOLN
INTRAMUSCULAR | Status: DC | PRN
  Administered 2023-07-18: 6000 [IU] via INTRAVENOUS
  Administered 2023-07-18: 2000 [IU] via INTRAVENOUS

## 2023-07-18 MED ORDER — SODIUM CHLORIDE 0.9 % IV SOLN
250.0000 mL | INTRAVENOUS | Status: DC | PRN
Start: 2023-07-18 — End: 2023-07-19

## 2023-07-18 MED ORDER — SODIUM CHLORIDE 0.9% FLUSH: 3.0000 mL | INTRAVENOUS | Status: DC | PRN

## 2023-07-18 MED ORDER — MAGNESIUM OXIDE -MG SUPPLEMENT 400 (240 MG) MG PO TABS
400.0000 mg | ORAL_TABLET | Freq: Every day | ORAL | Status: DC
  Administered 2023-07-19: 400 mg via ORAL
  Filled 2023-07-18: qty 1

## 2023-07-18 MED ORDER — VITAMIN D3 25 MCG (1000 UNIT) PO TABS
5000.0000 [IU] | ORAL_TABLET | Freq: Every day | ORAL | Status: DC
  Administered 2023-07-19: 5000 [IU] via ORAL
  Filled 2023-07-18 (×2): qty 5

## 2023-07-18 MED ORDER — LABETALOL HCL 5 MG/ML IV SOLN
INTRAVENOUS | Status: AC
  Filled 2023-07-18: qty 4

## 2023-07-18 MED ORDER — MIDAZOLAM HCL 2 MG/2ML IJ SOLN
INTRAMUSCULAR | Status: AC
  Filled 2023-07-18: qty 2

## 2023-07-18 MED ORDER — IBANDRONATE SODIUM 150 MG PO TABS: 150.0000 mg | ORAL_TABLET | ORAL | Status: DC

## 2023-07-18 MED ORDER — FENTANYL CITRATE (PF) 100 MCG/2ML IJ SOLN
INTRAMUSCULAR | Status: DC | PRN
  Administered 2023-07-18 (×2): 25 ug via INTRAVENOUS

## 2023-07-18 MED ORDER — MIDAZOLAM HCL 2 MG/2ML IJ SOLN
INTRAMUSCULAR | Status: DC | PRN
  Administered 2023-07-18: 1 mg via INTRAVENOUS

## 2023-07-18 MED ORDER — LIDOCAINE HCL (PF) 1 % IJ SOLN
INTRAMUSCULAR | Status: AC
  Filled 2023-07-18: qty 30

## 2023-07-18 MED ORDER — DAPAGLIFLOZIN PROPANEDIOL 10 MG PO TABS
10.0000 mg | ORAL_TABLET | Freq: Every morning | ORAL | Status: DC
  Administered 2023-07-19: 10 mg via ORAL
  Filled 2023-07-18: qty 1

## 2023-07-18 MED ORDER — AMLODIPINE BESYLATE 5 MG PO TABS
5.0000 mg | ORAL_TABLET | Freq: Every day | ORAL | Status: DC
  Filled 2023-07-18: qty 1

## 2023-07-18 MED ORDER — SODIUM CHLORIDE 0.9 % WEIGHT BASED INFUSION: 1.0000 mL/kg/h | INTRAVENOUS | Status: DC

## 2023-07-18 MED ORDER — CLOPIDOGREL BISULFATE 75 MG PO TABS
75.0000 mg | ORAL_TABLET | Freq: Every day | ORAL | Status: DC
  Administered 2023-07-19: 75 mg via ORAL
  Filled 2023-07-18: qty 1

## 2023-07-18 MED ORDER — SODIUM CHLORIDE 0.9% FLUSH
3.0000 mL | Freq: Two times a day (BID) | INTRAVENOUS | Status: DC
  Administered 2023-07-19: 3 mL via INTRAVENOUS

## 2023-07-18 MED ORDER — ONDANSETRON HCL 4 MG/2ML IJ SOLN: 4.0000 mg | Freq: Four times a day (QID) | INTRAMUSCULAR | Status: DC | PRN

## 2023-07-18 MED ORDER — CLOPIDOGREL BISULFATE 300 MG PO TABS
ORAL_TABLET | ORAL | Status: AC
  Filled 2023-07-18: qty 1

## 2023-07-18 MED ORDER — FENTANYL CITRATE (PF) 100 MCG/2ML IJ SOLN
INTRAMUSCULAR | Status: AC
  Filled 2023-07-18: qty 2

## 2023-07-18 MED ORDER — VITAMIN B-12 1000 MCG PO TABS
1000.0000 ug | ORAL_TABLET | Freq: Every day | ORAL | Status: DC
  Administered 2023-07-18 – 2023-07-19 (×2): 1000 ug via ORAL
  Filled 2023-07-18 (×2): qty 1

## 2023-07-18 MED ORDER — LOSARTAN POTASSIUM 50 MG PO TABS
100.0000 mg | ORAL_TABLET | Freq: Every day | ORAL | Status: DC
  Administered 2023-07-18: 100 mg via ORAL
  Filled 2023-07-18 (×2): qty 2

## 2023-07-18 MED ORDER — ALLOPURINOL 100 MG PO TABS
100.0000 mg | ORAL_TABLET | Freq: Every day | ORAL | Status: DC
  Administered 2023-07-18: 100 mg via ORAL
  Filled 2023-07-18: qty 1

## 2023-07-18 MED ORDER — LABETALOL HCL 5 MG/ML IV SOLN
INTRAVENOUS | Status: DC | PRN
  Administered 2023-07-18: 10 mg via INTRAVENOUS

## 2023-07-18 MED ORDER — PROTAMINE SULFATE 10 MG/ML IV SOLN
INTRAVENOUS | Status: DC | PRN
  Administered 2023-07-18: 15 mg via INTRAVENOUS
  Administered 2023-07-18: 5 mg via INTRAVENOUS

## 2023-07-18 MED ORDER — ATORVASTATIN CALCIUM 10 MG PO TABS
20.0000 mg | ORAL_TABLET | Freq: Every day | ORAL | Status: DC
  Administered 2023-07-19: 20 mg via ORAL
  Filled 2023-07-18: qty 2

## 2023-07-18 MED ORDER — HEPARIN SODIUM (PORCINE) 1000 UNIT/ML IJ SOLN
INTRAMUSCULAR | Status: AC
  Filled 2023-07-18: qty 10

## 2023-07-18 MED ORDER — ASPIRIN 81 MG PO CHEW: 81.0000 mg | CHEWABLE_TABLET | ORAL | Status: DC

## 2023-07-18 MED ORDER — AMIODARONE HCL 200 MG PO TABS
200.0000 mg | ORAL_TABLET | Freq: Every day | ORAL | Status: DC
  Filled 2023-07-18: qty 1

## 2023-07-18 MED ORDER — TIZANIDINE HCL 4 MG PO TABS: 2.0000 mg | ORAL_TABLET | Freq: Every day | ORAL | Status: DC

## 2023-07-18 MED ORDER — CLOPIDOGREL BISULFATE 300 MG PO TABS
ORAL_TABLET | ORAL | Status: DC | PRN
  Administered 2023-07-18: 300 mg via ORAL

## 2023-07-18 MED ORDER — SODIUM CHLORIDE 0.9 % WEIGHT BASED INFUSION
3.0000 mL/kg/h | INTRAVENOUS | Status: DC
  Administered 2023-07-18: 3 mL/kg/h via INTRAVENOUS

## 2023-07-18 MED ORDER — SODIUM CHLORIDE 0.9 % WEIGHT BASED INFUSION
1.0000 mL/kg/h | INTRAVENOUS | Status: AC
  Administered 2023-07-18: 1 mL/kg/h via INTRAVENOUS

## 2023-07-18 MED ORDER — ACETAMINOPHEN 325 MG PO TABS
650.0000 mg | ORAL_TABLET | ORAL | Status: DC | PRN
  Administered 2023-07-18: 650 mg via ORAL
  Filled 2023-07-18: qty 2

## 2023-07-18 MED ORDER — FENTANYL CITRATE (PF) 100 MCG/2ML IJ SOLN
25.0000 ug | Freq: Once | INTRAMUSCULAR | Status: AC
  Administered 2023-07-18: 25 ug via INTRAVENOUS

## 2023-07-18 MED ORDER — LIDOCAINE HCL (PF) 1 % IJ SOLN
INTRAMUSCULAR | Status: DC | PRN
  Administered 2023-07-18: 15 mL via INTRADERMAL

## 2023-07-18 MED ORDER — FAMOTIDINE 20 MG PO TABS: 40.0000 mg | ORAL_TABLET | Freq: Every evening | ORAL | Status: DC

## 2023-07-18 MED ORDER — LABETALOL HCL 5 MG/ML IV SOLN: 10.0000 mg | INTRAVENOUS | Status: DC | PRN

## 2023-07-18 MED ORDER — DULOXETINE HCL 60 MG PO CPEP
60.0000 mg | ORAL_CAPSULE | Freq: Every day | ORAL | Status: DC
  Administered 2023-07-19: 60 mg via ORAL
  Filled 2023-07-18: qty 1

## 2023-07-18 MED ORDER — IODIXANOL 320 MG/ML IV SOLN
INTRAVENOUS | Status: DC | PRN
  Administered 2023-07-18: 160 mL via INTRA_ARTERIAL

## 2023-07-18 SURGICAL SUPPLY — 21 items
BALLN MUSTANG 7.0X20 75 (BALLOONS) ×2
BALLOON MUSTANG 7.0X20 75 (BALLOONS) IMPLANT
CATH ANGIO 5F PIGTAIL 65CM (CATHETERS) IMPLANT
CATH NAVICROSS ANG 65CM (CATHETERS) IMPLANT
CATH SOS OMNI O 5F 80CM (CATHETERS) IMPLANT
CATHETER NAVICROSS ANG 65CM (CATHETERS) ×2
DEVICE CLOSURE MYNXGRIP 6/7F (Vascular Products) IMPLANT
KIT ENCORE 26 ADVANTAGE (KITS) IMPLANT
KIT MICROPUNCTURE NIT STIFF (SHEATH) IMPLANT
KIT PV (KITS) ×2 IMPLANT
SHEATH CATAPULT 7F 45 MP (SHEATH) IMPLANT
SHEATH PINNACLE 5F 10CM (SHEATH) IMPLANT
SHEATH PINNACLE 7F 10CM (SHEATH) IMPLANT
STENT VIABAHN 7X29 6FR 80 (Permanent Stent) IMPLANT
STOPCOCK MORSE 400PSI 3WAY (MISCELLANEOUS) IMPLANT
SYR MEDRAD MARK 7 150ML (SYRINGE) ×2 IMPLANT
TRANSDUCER W/STOPCOCK (MISCELLANEOUS) ×2 IMPLANT
TRAY PV CATH (CUSTOM PROCEDURE TRAY) ×2 IMPLANT
TUBING CIL FLEX 10 FLL-RA (TUBING) IMPLANT
WIRE HI TORQ VERSACORE-J 145CM (WIRE) IMPLANT
WIRE ROSEN-J .035X260CM (WIRE) IMPLANT

## 2023-07-18 NOTE — Interval H&P Note (Signed)
History and Physical Interval Note:  07/18/2023 10:03 AM  Amy Underwood  has presented today for surgery, with the diagnosis of MESENTRIC ISCHEMIA.  The various methods of treatment have been discussed with the patient and family. After consideration of risks, benefits and other options for treatment, the patient has consented to  Procedure(s): MESENTRIC  ANGIOGRAPHY (N/A) as a surgical intervention.  The patient's history has been reviewed, patient examined, no change in status, stable for surgery.  I have reviewed the patient's chart and labs.  Questions were answered to the patient's satisfaction.     Lorine Bears

## 2023-07-18 NOTE — Progress Notes (Signed)
Pt admitted to rm 3 from cath lab. Per report, bed rest was started 1215 pm. Initiated tele. VSS. Oriented pt to the unit. Call bell within reach.   Lawson Radar, RN

## 2023-07-19 ENCOUNTER — Encounter (HOSPITAL_COMMUNITY): Payer: Self-pay | Admitting: Cardiovascular Disease

## 2023-07-19 DIAGNOSIS — I10 Essential (primary) hypertension: Secondary | ICD-10-CM

## 2023-07-19 DIAGNOSIS — E7849 Other hyperlipidemia: Secondary | ICD-10-CM | POA: Diagnosis not present

## 2023-07-19 DIAGNOSIS — K551 Chronic vascular disorders of intestine: Secondary | ICD-10-CM

## 2023-07-19 LAB — LIPID PANEL
Cholesterol: 167 mg/dL (ref 0–200)
HDL: 80 mg/dL (ref >40)
LDL Cholesterol: 73 mg/dL (ref 0–99)
Total CHOL/HDL Ratio: 2.1 {ratio}
Triglycerides: 68 mg/dL (ref <150)
VLDL: 14 mg/dL (ref 0–40)

## 2023-07-19 MED ORDER — CLOPIDOGREL BISULFATE 75 MG PO TABS: 75.0000 mg | ORAL_TABLET | Freq: Every day | ORAL | 0 refills | Status: DC

## 2023-07-19 MED FILL — Fentanyl Citrate Preservative Free (PF) Inj 100 MCG/2ML: INTRAMUSCULAR | Qty: 2 | Status: AC

## 2023-07-19 NOTE — Progress Notes (Signed)
Patient has been given discharge instructions to include follow up appointments and new medication. Patient verbalizes understanding of instructions.

## 2023-07-19 NOTE — Discharge Summary (Addendum)
Discharge Summary    Patient ID: Amy Underwood MRN: [id number]; DOB: July 21, 1946  Admit date: 07/18/2023 Discharge date: 07/19/2023  PCP:  [name], NP   [facility] Providers Cardiologist:  None  Electrophysiologist:  Lanier Prude, MD  PV Cardiologist:  Lorine Bears, MD    Discharge Diagnoses    Principal Problem:   Chronic mesenteric ischemia [facility])  Diagnostic Studies/Procedures    Mesenteric angiography: 07/18/2023  1.  Significant ostial SMA stenosis 2.  Moderate calcified disease in the splenic branch of the celiac trunk with no significant ostial stenosis in the celiac trunk.  Moderate ostial IMA stenosis 3.  Successful covered stent placement to the ostial/proximal SMA.   Recommendations: Dual antiplatelet therapy for at least 6 months. Will hydrate overnight given chronic kidney disease and elevated blood pressure at the end of the case.  There was also oozing at the arterial side after Mynx closure device deployment with good hemostasis with manual pressure and 20 mg of protamine. _____________   History of Present Illness     Amy Underwood is a 77 y.o. female with past medical history of hypertension, hyperlipidemia, persistent atrial fibrillation status post watchman, chronic diastolic heart failure, CKD, gastric bypass surgery who was seen in the office with Dr. Kirke Corin 10/31 with complaints of postprandial nausea and vomiting with a 30 pound weight loss over the past year which was unintentional.  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  She was seen by GI and had an EGD done which was unremarkable.  She underwent an esophageal dilatation the month prior with no significant change in her symptoms.  She was referred for renal artery duplex which showed severe stenosis of the proximal SMA and  borderline significant stenosis in the celiac trunk.  Given these findings she was set up for outpatient mesenteric angiogram.   Hospital Course     Underwent outpatient mesenteric angiogram showing significant ostial SMA stenosis and moderate calcified disease in the splenic branch of the celiac trunk.  Successful covered stent placement to the ostial/proximal SMA with recommendations for DAPT with aspirin/Plavix for at least 6 months.  She was hydrated with IV fluids overnight and observed without complication.   Seen by Dr. Herbie [place] and deemed stable for discharge home.  Follow-up arranged in the office.  Medication sent to patient's pharmacy of choice.  Educated by [employer].D. prior to discharge. _____________  Discharge Vitals Blood pressure (!) 152/60, pulse 64, temperature 98.4 F (36.9 C), temperature source Oral, resp. rate 19, height 5\' 3"  (1.6 m), weight 71.2 kg, SpO2 98%.  Filed Weights   07/18/23 0628  Weight: 71.2 kg   General appearance: alert, cooperative, no distress, and mildly obese Neck: no carotid bruit and no JVD Lungs: clear to auscultation bilaterally and normal percussion bilaterally Heart: regular rate and rhythm, S1, S2 normal, no murmur, click, rub or gallop Abdomen: soft, non-tender; bowel sounds normal; no masses,  no organomegaly and NO BRUIT Extremities: extremities normal, atraumatic, no cyanosis or edema and femoral cath site is somewhat tender to palpation with a mild knot but no hematoma.  Minimal bruising. Neurologic: Grossly normal   Labs & Radiologic Studies    CBC No results for input(s): "WBC", "NEUTROABS", "HGB", "HCT", "MCV", "PLT" in the last 72 hours. Basic Metabolic Panel No results for input(s): "NA", "K", "CL", "CO2", "GLUCOSE", "BUN", "  CREATININE", "CALCIUM", "MG", "PHOS" in the last 72 hours. Liver Function Tests No results for input(s): "AST", "ALT", "ALKPHOS", "BILITOT", "PROT", "ALBUMIN" in the last 72 hours. No results for input(s):  "LIPASE", "AMYLASE" in the last 72 hours. High Sensitivity Troponin:   No results for input(s): "TROPONINIHS" in the last 720 hours.  BNP Invalid input(s): "POCBNP" D-Dimer No results for input(s): "DDIMER" in the last 72 hours. Hemoglobin A1C No results for input(s): "HGBA1C" in the last 72 hours. Fasting Lipid Panel Recent Labs    07/19/23 0441  CHOL 167  HDL 80  LDLCALC 73  TRIG 68  CHOLHDL 2.1   Thyroid Function Tests No results for input(s): "TSH", "T4TOTAL", "T3FREE", "THYROIDAB" in the last 72 hours.  Invalid input(s): "FREET3" _____________  PERIPHERAL VASCULAR CATHETERIZATION  Result Date: 07/18/2023 1.  Significant ostial SMA stenosis 2.  Moderate calcified disease in the splenic branch of the celiac trunk with no significant ostial stenosis in the celiac trunk.  Moderate ostial IMA stenosis 3.  Successful covered stent placement to the ostial/proximal SMA. Recommendations: Dual antiplatelet therapy for at least 6 months. Will hydrate overnight given chronic kidney disease and elevated blood pressure at the end of the case.  There was also oozing at the arterial side after Mynx closure device deployment with good hemostasis with manual pressure and 20 mg of protamine.   VAS [place] MESENTERIC  Result Date: 06/29/2023 ABDOMINAL VISCERAL Patient Name:  Amy Underwood  Date of Exam:   06/28/2023 Medical Rec #: [id number]     Accession #:    [id number] Date of Birth: 10-Jan-1946    Patient Gender: F Patient Age:   84 years Exam Location:  [place] Procedure:      VAS [place] MESENTERIC Referring Phys: [id number] MUHAMMAD A ARIDA -------------------------------------------------------------------------------- Indications: Chronic mesenteric ischemia. Post-prandial nausea and vomiting. 30              lb weight loss over last year. Fear of eating High Risk Factors: Hypertension, hyperlipidemia, past history of smoking. Comparison Study: No previous duplex. 03/07/23 CT reported stenotic celiac axis                    and SMA Performing Technologist: Quentin Ore RDMS, RVT, RDCS  Examination Guidelines: A complete evaluation includes B-mode imaging, spectral Doppler, color Doppler, and power Doppler as needed of all accessible portions of each vessel. Bilateral testing is considered an integral part of a complete examination. Limited examinations for reoccurring indications may be performed as noted.  Duplex Findings: +--------------------+--------+--------+------+--------------+ Mesenteric          PSV cm/sEDV cm/sPlaque   Comments    +--------------------+--------+--------+------+--------------+ Aorta at Celiac        81                 2.04 x 1.95 cm +--------------------+--------+--------+------+--------------+ Aorta Mid             112                                +--------------------+--------+--------+------+--------------+ Aorta Distal          135                                +--------------------+--------+--------+------+--------------+ Celiac Artery Origin  263                                +--------------------+--------+--------+------+--------------+  SMA Proximal          439      70                        +--------------------+--------+--------+------+--------------+ SMA Mid               283      57                        +--------------------+--------+--------+------+--------------+ SMA Distal            181      41                        +--------------------+--------+--------+------+--------------+ CHA                   558      70                        +--------------------+--------+--------+------+--------------+ Splenic               443                                +--------------------+--------+--------+------+--------------+ IMA                   128      21                        +--------------------+--------+--------+------+--------------+    Summary: Largest Aortic Diameter: 2.0 cm  Mesenteric: 70 to 99% stenosis in  the celiac artery and superior mesenteric artery. Areas of limited visceral study include right renal artery.  *See table(s) above for measurements and observations.  Suggest Peripheral Vascular Consult. Diagnosing physician: Nanetta Batty MD  Electronically signed by Nanetta Batty MD on 06/29/2023 at 1:03:50 PM.    Final    Disposition   Pt is being discharged home today in good condition.  Follow-up Plans & Appointments     Discharge Instructions     AMB Referral to Advanced Lipid Disorders Clinic   Complete by: As directed    Internal Lipid Clinic Referral Scheduling  Internal lipid clinic referrals are providers within [facility], who wish to refer established patients for routine management (help in starting PCSK9 inhibitor therapy) or advanced therapies.  Internal MD referral criteria:              1. All patients with LDL>190 mg/dL  2. All patients with Triglycerides >500 mg/dL  3. Patients with suspected or confirmed heterozygous familial hyperlipidemia (HeFH) or homozygous familial hyperlipidemia (HoFH)  4. Patients with family history of suspicious for genetic dyslipidemia desiring genetic testing  5. Patients refractory to standard guideline based therapy  6. Patients with statin intolerance (failed 2 statins, one of which must be a high potency statin)  7. Patients who the provider desires to be seen by MD   Internal PharmD referral criteria:   1. Follow-up patients for medication management  2. Follow-up for compliance monitoring  3. Patients for drug education  4. Patients with statin intolerance  5. PCSK9 inhibitor education and prior authorization approvals  6. Patients with triglycerides <500 mg/dL  External Lipid Clinic Referral  External lipid clinic referrals are for providers outside of [facility], considered new clinic patients - automatically routed to  MD schedule   Diet - low sodium heart healthy   Complete by: As directed    Discharge instructions    Complete by: As directed    Groin Site Care Refer to this sheet in the next few weeks. These instructions provide you with information on caring for yourself after your procedure. Your caregiver may also give you more specific instructions. Your treatment has been planned according to current medical practices, but problems sometimes occur. Call your caregiver if you have any problems or questions after your procedure. HOME CARE INSTRUCTIONS You may shower 24 hours after the procedure. Remove the bandage (dressing) and gently wash the site with plain soap and water. Gently pat the site dry.  Do not apply powder or lotion to the site.  Do not sit in a bathtub, swimming pool, or whirlpool for 5 to 7 days.  No bending, squatting, or lifting anything over 10 pounds (4.5 kg) as directed by your caregiver.  Inspect the site at least twice daily.  Do not drive home if you are discharged the same day of the procedure. Have someone else drive you.  You may drive 24 hours after the procedure unless otherwise instructed by your caregiver.  What to expect: Any bruising will usually fade within 1 to 2 weeks.  Blood that collects in the tissue (hematoma) may be painful to the touch. It should usually decrease in size and tenderness within 1 to 2 weeks.  SEEK IMMEDIATE MEDICAL CARE IF: You have unusual pain at the groin site or down the affected leg.  You have redness, warmth, swelling, or pain at the groin site.  You have drainage (other than a small amount of blood on the dressing).  You have chills.  You have a fever or persistent symptoms for more than 72 hours.  You have a fever and your symptoms suddenly get worse.  Your leg becomes pale, cool, tingly, or numb.  You have heavy bleeding from the site. Hold pressure on the site. Marland Kitchen  PLEASE DO NOT MISS ANY DOSES OF YOUR PLAVIX!!!!! Also keep a log of you blood pressures and bring back to your follow up appt. Please call the office with any questions.    Patients taking blood thinners should generally stay away from medicines like ibuprofen, Advil, Motrin, naproxen, and Aleve due to risk of stomach bleeding. You may take Tylenol as directed or talk to your primary doctor about alternatives.   PLEASE ENSURE THAT YOU DO NOT RUN OUT OF YOUR BRILINTA/PLAVIX. IF you have issues obtaining this medication due to cost please CALL the office 3-5 business days prior to running out in order to prevent missing doses of this medication.   Increase activity slowly   Complete by: As directed         Discharge Medications   Allergies as of 07/19/2023       Reactions   Alendronate Palpitations   Cefaclor Hives, Rash        Medication List     TAKE these medications    allopurinol 100 MG tablet Commonly known as: ZYLOPRIM Take 100 mg by mouth at bedtime.   amiodarone 200 MG tablet Commonly known as: PACERONE Take 1 tablet (200 mg total) by mouth daily.   amLODipine 10 MG tablet Commonly known as: NORVASC Take 1 tablet (10 mg total) by mouth daily. What changed: how much to take   aspirin EC 81 MG tablet Take 1 tablet (81 mg total) by mouth daily. Swallow whole.  atorvastatin 20 MG tablet Commonly known as: LIPITOR Take 1 tablet (20 mg total) by mouth daily. for cholesterol.   CALCIUM 1200+D3 PO Take 1 tablet by mouth daily.   clopidogrel 75 MG tablet Commonly known as: PLAVIX Take 1 tablet (75 mg total) by mouth daily with breakfast. Start taking on: July 20, 2023   cyanocobalamin 1000 MCG tablet Take 1,000 mcg by mouth daily. Vit B12   Dialyvite Vitamin D 5000 125 MCG (5000 UT) capsule Generic drug: Cholecalciferol Take 5,000 Units by mouth daily.   DULoxetine 60 MG capsule Commonly known as: Cymbalta Take 1 capsule (60 mg total) by mouth daily.   famotidine 40 MG tablet Commonly known as: PEPCID Take 1 tablet (40 mg total) by mouth every evening. for heartburn.   Farxiga 10 MG Tabs tablet Generic drug:  dapagliflozin propanediol Take 10 mg by mouth every morning.   furosemide 20 MG tablet Commonly known as: LASIX TAKE 1 TABLET(20 MG) BY MOUTH DAILY AS NEEDED   ibandronate 150 MG tablet Commonly known as: Boniva Take 1 tablet (150 mg total) by mouth every 30 (thirty) days. Take in the morning with a full glass of water, on an empty stomach, and do not take anything else by mouth or lie down for the next 30 min.   losartan 100 MG tablet Commonly known as: COZAAR Take 1 tablet (100 mg total) by mouth daily. for blood pressure   magnesium oxide 400 (240 Mg) MG tablet Commonly known as: MAG-OX TAKE 1 TABLET(400 MG) BY MOUTH DAILY   Otezla 30 MG Tabs Generic drug: Apremilast Take 1 tablet (30 mg total) by mouth 2 (two) times daily.   tiZANidine 4 MG tablet Commonly known as: Zanaflex Take 0.5-1 tablets (2-4 mg total) by mouth daily.         Outstanding Labs/Studies   Outpatient dopplers   Duration of Discharge Encounter   Greater than 30 minutes including physician time.  Signed, Laverda Page, NP 07/19/2023, 3:42 PM  Patient seen and evaluated on rounds morning along with Laverda Page, NP.  I reviewed the available chart data and procedures as well as medication and patient history.  I agree with the summary, examination findings/recommendations noted above.  Stable for discharge after being monitored overnight after Celiac Artery stent.  She does state that her abdominal pain is improved.  Ready for discharge home today.  She will follow-up with Dr. Lorine Bears.   Bryan Lemma, MD

## 2023-07-20 ENCOUNTER — Encounter: Payer: Self-pay | Admitting: Gastroenterology

## 2023-07-25 ENCOUNTER — Ambulatory Visit (INDEPENDENT_AMBULATORY_CARE_PROVIDER_SITE_OTHER): Payer: Medicare Other | Admitting: Primary Care

## 2023-07-25 ENCOUNTER — Encounter: Payer: Self-pay | Admitting: Primary Care

## 2023-07-25 VITALS — BP 164/78 | HR 59 | Temp 97.8°F | Ht 63.0 in | Wt 157.0 lb

## 2023-07-25 DIAGNOSIS — E782 Mixed hyperlipidemia: Secondary | ICD-10-CM

## 2023-07-25 DIAGNOSIS — M19011 Primary osteoarthritis, right shoulder: Secondary | ICD-10-CM

## 2023-07-25 DIAGNOSIS — N184 Chronic kidney disease, stage 4 (severe): Secondary | ICD-10-CM

## 2023-07-25 DIAGNOSIS — M19012 Primary osteoarthritis, left shoulder: Secondary | ICD-10-CM | POA: Diagnosis not present

## 2023-07-25 DIAGNOSIS — I480 Paroxysmal atrial fibrillation: Secondary | ICD-10-CM | POA: Diagnosis not present

## 2023-07-25 DIAGNOSIS — K551 Chronic vascular disorders of intestine: Secondary | ICD-10-CM | POA: Diagnosis not present

## 2023-07-25 DIAGNOSIS — I10 Essential (primary) hypertension: Secondary | ICD-10-CM | POA: Diagnosis not present

## 2023-07-25 DIAGNOSIS — K219 Gastro-esophageal reflux disease without esophagitis: Secondary | ICD-10-CM

## 2023-07-25 DIAGNOSIS — M1A9XX0 Chronic gout, unspecified, without tophus (tophi): Secondary | ICD-10-CM

## 2023-07-25 MED ORDER — DULOXETINE HCL 30 MG PO CPEP: 30.0000 mg | ORAL_CAPSULE | Freq: Every day | ORAL | 0 refills | Status: DC

## 2023-07-25 NOTE — Patient Instructions (Addendum)
Medications to continue:  Allopurinol for gout. Amiodarone for heart rate control. [place] for psoriasis Aspirin 81 mg at bedtime for recent stent Atorvastatin for cholesterol Clopidogrel (Plavix) - move this to bedtime Farxiga for kidneys Furosemide as needed for leg swelling Boniva bone density pill  Losartan for blood pressure   Homework:  Check the dose of amlodipine for blood pressure.  Discontinue/Wean:  We are reducing your duloxetine (Cymbalta) for joint pain to 30 mg for 2-4 weeks, then stop.   Famotidine (Pepcid)  It was a pleasure to see you today!

## 2023-07-25 NOTE — Assessment & Plan Note (Signed)
Following with nephrology, office notes reviewed from October 2024.  Continue Farxiga 10 mg daily and losartan 100 mg daily.

## 2023-07-25 NOTE — Progress Notes (Signed)
Subjective:    Patient ID: Amy Underwood, female    DOB: February 21, 1946, 77 y.o.   MRN: [id number]  HPI  Amy Underwood is a very pleasant 77 y.o. female with a history of hypertension, paroxysmal atrial fibrillation, chronic mesenteric ischemia, asthma, GERD, psoriasis, anemia, CKD, gout, gastric bypass who presents today to discuss medication management.  She is currently managed on aspirin 81 mg and clopidogrel 75 mg daily for recent SMA stenosis and moderate calcified disease in the splenic branch of the celiac trunk, s/p stent placement on 07/18/23. Following with cardiology.   Also managed on amlodipine 10 mg daily, losartan 100 mg daily for hypertension. Following with cardiology for paroxysmal atrial fibrillation and is managed on amiodarone 200 mg daily. She is also managed on furosemide 20 mg PRN for lower extremity edema. She is checking her BP at home which is running 120-140s/60-80. Her highest reading was 148/89. Her last home BP reading was 127/81 in 07/24/23.   Following with nephrology, also managed on Farxiga 10 mg daily for CKD. Last office visit was in October 2024. No medication changes made, however, it is still noted that she is on diltiazem for which she is not.   She is also managed on duloxetine 60 mg daily for which she thinks she's taking for joint pain. The duloxetine was prescribed by her orthopedist while she was undergoing surgery for shoulder in April 2024.  Today she'd like to condense her medications as she is taking too much. The clopidogrel is making her drowsy. She denies esophageal burning or reflux. She's doing well in terms of her shoulder pain, doesn't believe she needs the Cymbalta any longer.    BP Readings from Last 3 Encounters:  07/25/23 (!) 164/78  07/19/23 (!) 152/60  07/05/23 (!) 140/60      Review of Systems  Respiratory:  Negative for shortness of breath.   Cardiovascular:  Negative for chest pain.  Gastrointestinal:  Negative for  constipation and diarrhea.  Musculoskeletal:  Negative for arthralgias.  Hematological:  Does not bruise/bleed easily.         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 Lalla Brothers   Psoriasis    Unintentional weight loss 04/28/2023    Social History   Socioeconomic History   Marital status: Widowed    Spouse name: Not on file   Number of children: 2   Years of education: Not on file   Highest education level: GED or equivalent  Occupational History   Occupation: retired  Tobacco Use   Smoking status: Former    Current packs/day: 2.00    Average packs/day: 2.0 packs/day for 25.3 years (50.6 ttl pk-yrs)    Types: Cigarettes    Start date: 2001   Smokeless tobacco: Former  Building services engineer status: Never Used  Substance and Sexual Activity   Alcohol use: Never   Drug use: Never   Sexual activity: Not Currently  Other Topics Concern   Not  on file  Social History Narrative   Not on file   Social Determinants of Health   Financial Resource Strain: Low Risk  (07/21/2023)   Overall Financial Resource Strain (CARDIA)    Difficulty of Paying Living Expenses: Not very hard  Food Insecurity: No Food Insecurity (07/21/2023)   Hunger Vital Sign    Worried About Running Out of Food in the Last Year: Never true    Ran Out of Food in the Last Year: Never true  Transportation Needs: No Transportation Needs (07/21/2023)   PRAPARE - Administrator, Civil Service (Medical): No     Lack of Transportation (Non-Medical): No  Physical Activity: Insufficiently Active (07/21/2023)   Exercise Vital Sign    Days of Exercise per Week: 2 days    Minutes of Exercise per Session: 20 min  Stress: Stress Concern Present (07/21/2023)   [employer] of Occupational Health - Occupational Stress Questionnaire    Feeling of Stress : To some extent  Social Connections: Moderately Isolated (07/21/2023)   Social Connection and Isolation Panel [NHANES]    Frequency of Communication with Friends and Family: Twice a week    Frequency of Social Gatherings with Friends and Family: Twice a week    Attends Religious Services: 1 to 4 times per year    Active Member of [employer] or Organizations: No    Attends Banker Meetings: Never    Marital Status: Widowed  Intimate Partner Violence: Not At Risk (05/02/2023)   Humiliation, Afraid, Rape, and Kick questionnaire    Fear of Current or Ex-Partner: No    Emotionally Abused: No    Physically Abused: No    Sexually Abused: No    Past Surgical History:  Procedure Laterality Date   ATRIAL FIBRILLATION ABLATION N/A 01/03/2022   Procedure: ATRIAL FIBRILLATION ABLATION;  Surgeon: Lanier Prude, MD;  Location: [facility];  Service: Cardiovascular;  Laterality: N/A;   CARPAL TUNNEL RELEASE Right 01/18/2023   Procedure: CARPAL TUNNEL RELEASE;  Surgeon: Jones Broom, MD;  Location: [facility];  Service: Orthopedics;  Laterality: Right;   CESAREAN SECTION     3   CHOLECYSTECTOMY     HIP ARTHROPLASTY Left    KNEE ARTHROPLASTY Bilateral    LEFT ATRIAL APPENDAGE OCCLUSION N/A 04/20/2022   Procedure: LEFT ATRIAL APPENDAGE OCCLUSION;  Surgeon: Lanier Prude, MD;  Location: [facility];  Service: Cardiovascular;  Laterality: N/A;   PERIPHERAL VASCULAR INTERVENTION  07/18/2023   Procedure: PERIPHERAL VASCULAR INTERVENTION;  Surgeon: Iran Ouch, MD;  Location: [facility];  Service: Cardiovascular;;   REVERSE  SHOULDER ARTHROPLASTY Right 01/18/2023   Procedure: REVERSE SHOULDER ARTHROPLASTY;  Surgeon: Jones Broom, MD;  Location: [facility];  Service: Orthopedics;  Laterality: Right;   TEE WITHOUT CARDIOVERSION N/A 04/20/2022   Procedure: TRANSESOPHAGEAL ECHOCARDIOGRAM (TEE);  Surgeon: Lanier Prude, MD;  [facility];  Service: Cardiovascular;  Laterality: N/A;   VISCERAL ANGIOGRAPHY N/A 07/18/2023   Procedure: MESENTRIC  ANGIOGRAPHY;  Surgeon: Iran Ouch, MD;  Location: [facility];  Service: Cardiovascular;  Laterality: N/A;    Family History  Problem Relation Age of Onset   Heart disease Mother    Breast cancer Neg Hx    Colon cancer Neg Hx    Esophageal cancer Neg Hx    Inflammatory bowel disease Neg Hx    Liver disease Neg Hx    Pancreatic cancer Neg Hx  Rectal cancer Neg Hx    Stomach cancer Neg Hx     Allergies  Allergen Reactions   Alendronate Palpitations   Cefaclor Hives and Rash    Current Outpatient Medications on File Prior to Visit  Medication Sig Dispense Refill   allopurinol (ZYLOPRIM) 100 MG tablet Take 100 mg by mouth at bedtime.     amiodarone (PACERONE) 200 MG tablet Take 1 tablet (200 mg total) by mouth daily. 90 tablet 0   amLODipine (NORVASC) 10 MG tablet Take 1 tablet (10 mg total) by mouth daily. (Patient taking differently: Take 5 mg by mouth daily.) 180 tablet 3   Apremilast (OTEZLA) 30 MG TABS Take 1 tablet (30 mg total) by mouth 2 (two) times daily. 180 tablet 3   aspirin EC 81 MG tablet Take 1 tablet (81 mg total) by mouth daily. Swallow whole.     atorvastatin (LIPITOR) 20 MG tablet Take 1 tablet (20 mg total) by mouth daily. for cholesterol. 90 tablet 1   Calcium-Magnesium-Vitamin D (CALCIUM 1200+D3 PO) Take 1 tablet by mouth daily.     Cholecalciferol (DIALYVITE VITAMIN D 5000) 125 MCG (5000 UT) capsule Take 5,000 Units by mouth daily.     clopidogrel (PLAVIX) 75 MG tablet Take 1 tablet (75 mg total) by mouth daily  with breakfast. 90 tablet 0   cyanocobalamin 1000 MCG tablet Take 1,000 mcg by mouth daily. Vit B12     FARXIGA 10 MG TABS tablet Take 10 mg by mouth every morning.     furosemide (LASIX) 20 MG tablet TAKE 1 TABLET(20 MG) BY MOUTH DAILY AS NEEDED 90 tablet 3   ibandronate (BONIVA) 150 MG tablet Take 1 tablet (150 mg total) by mouth every 30 (thirty) days. Take in the morning with a full glass of water, on an empty stomach, and do not take anything else by mouth or lie down for the next 30 min. 3 tablet 3   losartan (COZAAR) 100 MG tablet Take 1 tablet (100 mg total) by mouth daily. for blood pressure 90 tablet 1   magnesium oxide (MAG-OX) 400 (240 Mg) MG tablet TAKE 1 TABLET(400 MG) BY MOUTH DAILY 90 tablet 0   No current facility-administered medications on file prior to visit.    BP (!) 164/78   Pulse (!) 59   Temp 97.8 F (36.6 C) (Temporal)   Ht 5\' 3"  (1.6 m)   Wt 157 lb (71.2 kg)   SpO2 98%   BMI 27.81 kg/m  Objective:   Physical Exam Cardiovascular:     Rate and Rhythm: Normal rate and regular rhythm.  Pulmonary:     Effort: Pulmonary effort is normal.     Breath sounds: Normal breath sounds.  Musculoskeletal:     Cervical back: Neck supple.  Skin:    General: Skin is warm and dry.  Neurological:     Mental Status: She is alert and oriented to person, place, and time.  Psychiatric:        Mood and Affect: Mood normal.           Assessment & Plan:  Arthritis of both glenohumeral joints Assessment & Plan: Resolved.  Will wean off of Cymbalta.  Reduce Cymbalta to 30 mg daily x 2 to 4 weeks, then discontinue. She will update if she needs to resume.  Orders: -     DULoxetine HCl; Take 1 capsule (30 mg total) by mouth daily. For joint pain.  Dispense: 90 capsule; Refill: 0  Superior mesenteric artery  stenosis [facility]) Assessment & Plan: S/p stent placement per cardiology. Reviewed procedure notes form November 2024.  Continue aspirin 81 mg daily and clopidogrel  75 mg daily, discussed with patient today.   Essential hypertension Assessment & Plan: Above goal in the office historically, home readings are controlled overall.  Continue losartan 100 mg daily. It is unclear if she is taking amlodipine 5 mg or 10 mg, she will check her bottle at home and update [place] later.  We will try to combine her amlodipine and losartan together and 1 pill   Paroxysmal atrial fibrillation [facility]) Assessment & Plan: Following with cardiology, office notes reviewed from October 2024.  Continue amiodarone 200 mg daily. Continue clopidogrel 75 mg daily and aspirin 81 mg daily.   Gastroesophageal reflux disease without esophagitis Assessment & Plan: Resolved.  Discontinue famotidine 40 mg daily.   Chronic kidney disease, stage IV (severe) [facility]) Assessment & Plan: Following with nephrology, office notes reviewed from October 2024.  Continue Farxiga 10 mg daily and losartan 100 mg daily.   Chronic gout without tophus, unspecified cause, unspecified site Assessment & Plan: Controlled. continue allopurinol 100 mg daily.   Mixed hyperlipidemia Assessment & Plan: Continue atorvastatin 20 mg daily.         [name], NP

## 2023-07-25 NOTE — Assessment & Plan Note (Signed)
Above goal in the office historically, home readings are controlled overall.  Continue losartan 100 mg daily. It is unclear if she is taking amlodipine 5 mg or 10 mg, she will check her bottle at home and update [place] later.  We will try to combine her amlodipine and losartan together and 1 pill

## 2023-07-25 NOTE — Assessment & Plan Note (Signed)
S/p stent placement per cardiology. Reviewed procedure notes form November 2024.  Continue aspirin 81 mg daily and clopidogrel 75 mg daily, discussed with patient today.

## 2023-07-25 NOTE — Assessment & Plan Note (Addendum)
Controlled. continue allopurinol 100 mg daily.

## 2023-07-25 NOTE — Assessment & Plan Note (Signed)
Following with cardiology, office notes reviewed from October 2024.  Continue amiodarone 200 mg daily. Continue clopidogrel 75 mg daily and aspirin 81 mg daily.

## 2023-07-25 NOTE — Assessment & Plan Note (Signed)
Resolved.  Will wean off of Cymbalta.  Reduce Cymbalta to 30 mg daily x 2 to 4 weeks, then discontinue. She will update if she needs to resume.

## 2023-07-25 NOTE — Assessment & Plan Note (Signed)
Resolved.  Discontinue famotidine 40 mg daily.

## 2023-07-25 NOTE — Assessment & Plan Note (Signed)
Continue atorvastatin 20 mg daily. 

## 2023-08-06 NOTE — Progress Notes (Unsigned)
Cardiology Clinic Note   Date: 08/09/2023 ID: Amy Underwood, DOB February 08, 1946, MRN [id number]  Primary Cardiologist:  None  Patient Profile    Amy Underwood is a 77 y.o. female who presents to the clinic today for follow up after procedure.     Past medical history significant for: PAF. A-fib ablation 01/03/2022. Watchman placement 04/21/2023. Cardiac CT 06/22/2022: Well-placed watchman with no leak, full endothelialization and average compression 18%.  Moderate BAE.  Small residual left-to-right shunt through transseptal puncture site. 14-day ZIO 05/28/2023: HR 51 to 245 bpm, average 94 bpm.  AF/AFL burden 80%, average 99 bpm.  37 runs of SVT, longest 13.2 seconds average 128 bpm.  Rare ectopy. Chronic HFpEF. Echo 06/26/2022: EF 60 to 65%.  No RWMA.  Moderate LVH of the basal-septal segment.  Grade II DD.  Normal RV size/function.  Normal PA pressure.  Mild LAE.  Mild MR/AI.  Mild AS, mean gradient 8 mmHg. Chronic mesenteric ischemia. Mesenteric complete 06/28/2023: 70 to 99% stenosis of the celiac artery and superior mesenteric artery.  Areas of limited visceral study include right renal artery. Visceral angiography 07/18/2023: Significant ostial SMA stenosis.  Moderately calcified disease in the splenic branch of the celiac trunk with no significant ostial stenosis in the celiac trunk.  Moderate ostial IMA stenosis.  Successful covered stent placement to the ostial/proximal SMA. Mesenteric complete 08/08/2023: Normal celiac findings. 70-99% stenosis superior mesenteric artery. Recommend 12 month follow up study.  Hypertension. Hyperlipidemia. Lipid panel 07/19/2023: LDL 73, HDL 80, TG 68, total 167. GERD. Asthma. CKD stage IV.  In summary, patient was previously followed by EP in [place]. She established care with Dr. Lalla Brothers on 07/06/2021 after moving to the area to be close to her son following the death of her husband. Agib managed on dofetilide and diltiazem with Xarelto for stroke  prophylaxis. Patient was scheduled for workup prior to afib ablation.  Xarelto became cost prohibitive and she was switched to Coumadin.  In March 2023 she was seen in the office and expressed desire to come off of anticoagulation.  Watchman was discussed and she was referred to structural heart team.  She underwent successful A-fib ablation on 01/03/2022.  On follow-up with A-fib clinic on 01/31/2022 patient presents a-flutter DCCV was planned.  She was seen in the office on 02/14/2022 prior to scheduling DCCV and was found to be out of a-flutter.  Patient underwent watchman placement on 04/20/2022. Patient was seen in the ED on 02/08/2023 with decompensated heart failure and was diuresed with improvement of symptoms. She then developed flu and was sick for the next several weeks.  Tikosyn was discontinued in March 2024 due to no A-fib.  In August 2024 she was not having palpitations but was found to have an irregular heartbeat at a doctor's appointment with a different specialty and was referred to cardiology.  14-day ZIO September 2024 showed 80% AF/AFL burden (as detailed above).  Patient was referred to Dr. Kirke Corin for possible chronic mesenteric ischemia at the request of Dr. Lalla Brothers.  She was evaluated on 06/07/2023 with reports of postprandial nausea and occasional vomiting with 30 pound unintentional weight loss over 1 year.  She reported fear of eating due to GI symptoms but no discrete abdominal pain.  CT abdomen July 2024 showed extensive aortic atherosclerosis and severe stenosis at the origin of the SMA and at least moderate stenosis involving the celiac trunk.  Her EGD with GI was unremarkable.  She had esophageal dilatation September 2024 which did not improve  her symptoms.  She underwent imaging which showed 70 to 99% stenosis of the celiac artery and superior mesenteric artery as detailed above.  She had successful stent placement to ostial/proximal SMA 07/18/2023.     History of Present Illness     Amy Underwood is followed by Dr. Lalla Brothers and Dr. Kirke Corin for the above outlined history.   Patient was last seen in the office by Sherie Don, NP on 06/14/2023 to follow-up on heart monitor.  Patient reported increased episodes of feeling her heart racing multiple times throughout the day most days of the week.  Patient declined related to fatigue send or redo ablation.  She was started on amiodarone.  Patient's last office visit with Dr. Kirke Corin was 07/05/2023 in preparation for visceral angiography.  She was started on daily aspirin at that time.  Today, patient is here alone for follow up after SMA stent placement. She reports continued postprandial nausea. She had episodes of vomiting yesterday. Femoral site of procedure with resolving tenderness and bruising. She is very frustrated that she is still having symptoms. She is otherwise stable from a cardiac standpoint. She denies palpitations. She has noticed that her heart rate has been increased but it will go down into the 60s. She denies runs of afib stating "believe me I know when I go into afib." She has no other complaints today.      ROS: All other systems reviewed and are otherwise negative except as noted in History of Present Illness.  Studies Reviewed    EKG is not ordered today.   Physical Exam    VS:  BP 115/60 (BP Location: Left Arm, Patient Position: Sitting, Cuff Size: Normal)   Pulse (!) 106   Ht 5\' 3"  (1.6 m)   Wt 150 lb 12.8 oz (68.4 kg)   SpO2 95%   BMI 26.71 kg/m  , BMI Body mass index is 26.71 kg/m.  GEN: Well nourished, well developed, in no acute distress. Neck: No JVD or carotid bruits. Cardiac:  RRR. No murmurs. No rubs or gallops.   Respiratory:  Respirations regular and unlabored. Clear to auscultation without rales, wheezing or rhonchi. GI: Soft, nontender, nondistended. Extremities: Radials/DP/PT 2+ and equal bilaterally. No clubbing or cyanosis. No edema. Resolving ecchymosis and mild tenderness to  palpation right upper thigh/groin. Skin: Warm and dry, no rash. Neuro: Strength intact.  Assessment & Plan   Chronic mesenteric ischemia S/p stent placement to ostial/proximal SMA 07/18/2023. Complete mesenteric [place] on 08/08/2023 showed normal celiac artery, 70-99% superior mesenteric artery.  Patient continues to have postprandial nausea. She had episodes of vomiting one day ago. She is very frustrated that she continues to feel poorly. Femoral procedure site with resolving discomfort. Healing ecchymosis and mild tenderness to palpation on exam.  -Will forward note to Dr. Kirke Corin to review latest [place].  -Continue aspirin, Plavix.  PAF/a-flutter S/p A-fib ablation May 2023.  Watchman placement August 2023.  14-day ZIO September 2024 showed 80% burden of AF/AFL.  Patient started on amiodarone October 2024.  She reports no palpitations or episodes of afib. Her heart has been elevated the last few days but will drop back down to the 60s.  -Keep follow up with EP 09/07/2023. -Continue amiodarone.  Chronic HFpEF Echo February 2023 showed normal LV/RV function, Grade II DD, normal PA pressure, mild LAE, mild MR/AI, mild AS with mean gradient 8 mmHg.  Patient denies lower extremity edema.  Euvolemic and well compensated on exam. -Continue losartan, Lasix as needed, [place].  Hypertension  BP today 115/60.  -Continue amlodipine, losartan.  Hyperlipidemia LDL November 2024 73, at goal. -Continue atorvastatin.  Disposition: Return for previously scheduled EP visit with Sherie Don, NP on 09/07/2023. Return in 2-3 months or sooner as needed.          Signed, Etta Grandchild. Lemarcus Baggerly, DNP, NP-C

## 2023-08-08 ENCOUNTER — Ambulatory Visit: Payer: Medicare Other | Attending: Cardiovascular Disease

## 2023-08-08 DIAGNOSIS — K551 Chronic vascular disorders of intestine: Secondary | ICD-10-CM | POA: Diagnosis not present

## 2023-08-08 DIAGNOSIS — R110 Nausea: Secondary | ICD-10-CM

## 2023-08-08 MED ORDER — ONDANSETRON 4 MG PO TBDP: 4.0000 mg | ORAL_TABLET | Freq: Three times a day (TID) | ORAL | 0 refills | Status: DC | PRN

## 2023-08-09 ENCOUNTER — Ambulatory Visit: Payer: Medicare Other | Attending: Student | Admitting: Student

## 2023-08-09 ENCOUNTER — Encounter: Payer: Self-pay | Admitting: Student

## 2023-08-09 VITALS — BP 115/60 | HR 106 | Ht 63.0 in | Wt 150.8 lb

## 2023-08-09 DIAGNOSIS — I480 Paroxysmal atrial fibrillation: Secondary | ICD-10-CM

## 2023-08-09 DIAGNOSIS — K551 Chronic vascular disorders of intestine: Secondary | ICD-10-CM

## 2023-08-09 DIAGNOSIS — I5032 Chronic diastolic (congestive) heart failure: Secondary | ICD-10-CM | POA: Diagnosis not present

## 2023-08-09 DIAGNOSIS — E785 Hyperlipidemia, unspecified: Secondary | ICD-10-CM

## 2023-08-09 DIAGNOSIS — I4892 Unspecified atrial flutter: Secondary | ICD-10-CM | POA: Diagnosis not present

## 2023-08-09 DIAGNOSIS — I10 Essential (primary) hypertension: Secondary | ICD-10-CM

## 2023-08-09 NOTE — Patient Instructions (Signed)
Medication Instructions:  No changes at this time.   *If you need a refill on your cardiac medications before your next appointment, please call your pharmacy*   Lab Work: None  If you have labs (blood work) drawn today and your tests are completely normal, you will receive your results only by: MyChart Message (if you have MyChart) OR A paper copy in the mail If you have any lab test that is abnormal or we need to change your treatment, we will call you to review the results.   Testing/Procedures: None    Follow-Up: At [facility], you and your health needs are our priority.  As part of our continuing mission to provide you with exceptional heart care, we have created designated Provider Care Teams.  These Care Teams include your primary Cardiologist (physician) and Advanced Practice Providers (APPs -  Physician Assistants and Nurse Practitioners) who all work together to provide you with the care you need, when you need it.   Your next appointment:   2 month(s)  Provider:   Lorine Bears, MD

## 2023-08-10 NOTE — Addendum Note (Signed)
Addended by: Sandi Mariscal on: 08/10/2023 07:46 AM   Modules accepted: Orders

## 2023-08-17 ENCOUNTER — Encounter: Payer: Self-pay | Admitting: Pharmacist

## 2023-08-17 NOTE — Progress Notes (Signed)
Pharmacy Quality Measure Review  This patient is appearing on a report for being at risk of failing the adherence measure for diabetes medications this calendar year.   Medication: Farxiga 10 mg daily Last fill date: 05/30/23 for 30 day supply.  Incorrect attribution. Patient is prescribed Farxiga for CKD. No hx diabetes.  Also, fill hx likely 2/2: "Cost prohibitive. Will apply for patient assistance" - [facility] note October

## 2023-08-31 ENCOUNTER — Ambulatory Visit: Payer: Medicare Other | Admitting: Cardiology

## 2023-09-03 ENCOUNTER — Encounter: Payer: Self-pay | Admitting: Primary Care

## 2023-09-03 ENCOUNTER — Ambulatory Visit: Payer: Medicare Other | Admitting: Cardiology

## 2023-09-03 ENCOUNTER — Ambulatory Visit (INDEPENDENT_AMBULATORY_CARE_PROVIDER_SITE_OTHER): Payer: Medicare Other | Admitting: Primary Care

## 2023-09-03 VITALS — BP 148/78 | HR 75 | Temp 97.8°F | Ht 63.0 in | Wt 156.0 lb

## 2023-09-03 DIAGNOSIS — R112 Nausea with vomiting, unspecified: Secondary | ICD-10-CM

## 2023-09-03 DIAGNOSIS — K551 Chronic vascular disorders of intestine: Secondary | ICD-10-CM

## 2023-09-03 NOTE — Progress Notes (Signed)
Subjective:    Patient ID: Amy Underwood, female    DOB: [birth year], 77 y.o.   MRN: [id number]  GI Problem The primary symptoms include nausea. Primary symptoms do not include abdominal pain or diarrhea.  The illness does not include constipation.    Amy Underwood is a very pleasant 77 y.o. female with a history of hypertension, superior mesenteric artery stenosis, GERD, dysphagia, dilated bile ducts, rectal bleeding, nausea, iron deficiency anemia, gastric bypass who presents today to discuss post prandial nausea.   Her nausea occurs when eating most any meal or snack, particularly with meats, fried food, greasy food. She has been watching her diet closely, has noticed improvement in nausea by doing this. She's been taking ondansetron as needed with improvement. Over the last 2 weeks she's vomited only once which is a marked improvement.   History of SMA stent placement in November 2024.  She underwent mesenteric artery ultrasound on 08/08/2023 which revealed normal celiac artery, 70 to 99% superior mesenteric artery.  Evaluated by cardiology on 08/09/2023, mentioned ongoing postprandial nausea.  Following with GI, completed upper endoscopy which was unremarkable.  She underwent esophageal dilation in September 2024 which did not show improvement in her symptoms.  BP Readings from Last 3 Encounters:  09/03/23 (!) 148/78  08/09/23 115/60  07/25/23 (!) 164/78   Wt Readings from Last 3 Encounters:  09/03/23 156 lb (70.8 kg)  08/09/23 150 lb 12.8 oz (68.4 kg)  07/25/23 157 lb (71.2 kg)       Review of Systems  Constitutional:  Negative for unexpected weight change.  Gastrointestinal:  Positive for nausea. Negative for abdominal pain, constipation and diarrhea.       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 Lalla Brothers   Psoriasis    Unintentional weight loss 04/28/2023    Social History   Socioeconomic History   Marital status: Widowed    Spouse name: Not on file   Number of children: 2   Years of education: Not on file   Highest education level: GED or equivalent  Occupational History   Occupation: retired  Tobacco Use   Smoking status: Former    Current packs/day: 2.00    Average packs/day: 2.0 packs/day for 25.4 years (50.8 ttl pk-yrs)    Types: Cigarettes    Start date: 2001   Smokeless tobacco: Former  Building services engineer status: Never Used  Substance and Sexual Activity   Alcohol use: Never   Drug use: Never   Sexual activity: Not Currently  Other Topics Concern   Not on file  Social History Narrative   Not on file   Social Drivers of Health   Financial Resource Strain: Low Risk  (09/02/2023)   Overall Financial Resource Strain (CARDIA)    Difficulty of Paying Living Expenses: Not very hard  Food Insecurity: No Food Insecurity (09/02/2023)   Hunger Vital Sign  Worried About Programme researcher, broadcasting/film/video in the Last Year: Never true    Ran Out of Food in the Last Year: Never true  Transportation Needs: No Transportation Needs (09/02/2023)   PRAPARE - Administrator, Civil Service (Medical): No    Lack of Transportation (Non-Medical): No  Physical Activity: Insufficiently Active (09/02/2023)   Exercise Vital Sign    Days of Exercise per Week: 3 days    Minutes of Exercise per Session: 20 min  Stress: Stress Concern Present (09/02/2023)   [employer] of Occupational Health - Occupational Stress Questionnaire     Feeling of Stress : To some extent  Social Connections: Moderately Isolated (09/02/2023)   Social Connection and Isolation Panel [NHANES]    Frequency of Communication with Friends and Family: More than three times a week    Frequency of Social Gatherings with Friends and Family: Once a week    Attends Religious Services: 1 to 4 times per year    Active Member of [employer] or Organizations: No    Attends Banker Meetings: Never    Marital Status: Widowed  Intimate Partner Violence: Not At Risk (05/02/2023)   Humiliation, Afraid, Rape, and Kick questionnaire    Fear of Current or Ex-Partner: No    Emotionally Abused: No    Physically Abused: No    Sexually Abused: No    Past Surgical History:  Procedure Laterality Date   ATRIAL FIBRILLATION ABLATION N/A 01/03/2022   Procedure: ATRIAL FIBRILLATION ABLATION;  Surgeon: Lanier Prude, MD;  Location: [facility];  Service: Cardiovascular;  Laterality: N/A;   CARPAL TUNNEL RELEASE Right 01/18/2023   Procedure: CARPAL TUNNEL RELEASE;  Surgeon: Jones Broom, MD;  Location: [facility];  Service: Orthopedics;  Laterality: Right;   CESAREAN SECTION     3   CHOLECYSTECTOMY     HIP ARTHROPLASTY Left    KNEE ARTHROPLASTY Bilateral    LEFT ATRIAL APPENDAGE OCCLUSION N/A 04/20/2022   Procedure: LEFT ATRIAL APPENDAGE OCCLUSION;  Surgeon: Lanier Prude, MD;  Location: [facility];  Service: Cardiovascular;  Laterality: N/A;   PERIPHERAL VASCULAR INTERVENTION  07/18/2023   Procedure: PERIPHERAL VASCULAR INTERVENTION;  Surgeon: Iran Ouch, MD;  Location: [facility];  Service: Cardiovascular;;   REVERSE SHOULDER ARTHROPLASTY Right 01/18/2023   Procedure: REVERSE SHOULDER ARTHROPLASTY;  Surgeon: Jones Broom, MD;  Location: [facility];  Service: Orthopedics;  Laterality: Right;   TEE WITHOUT CARDIOVERSION N/A 04/20/2022   Procedure: TRANSESOPHAGEAL ECHOCARDIOGRAM (TEE);  Surgeon: Lanier Prude, MD;  Location:  [facility] INVAS[facility] CV [facility];  Service: Cardiovascular;  Laterality: N/A;   VISCERAL ANGIOGRAPHY N/A 07/18/2023   Procedure: MESENTRIC  ANGIOGRAPHY;  Surgeon: Iran Ouch, MD;  Location: [facility];  Service: Cardiovascular;  Laterality: N/A;    Family History  Problem Relation Age of Onset   Heart disease Mother    Breast cancer Neg Hx    Colon cancer Neg Hx    Esophageal cancer Neg Hx    Inflammatory bowel disease Neg Hx    Liver disease Neg Hx    Pancreatic cancer Neg Hx    Rectal cancer Neg Hx    Stomach cancer Neg Hx     Allergies  Allergen Reactions   Alendronate Palpitations   Cefaclor Hives and Rash    Current Outpatient Medications on File Prior to Visit  Medication Sig Dispense Refill   allopurinol (ZYLOPRIM) 100 MG tablet Take 100 mg by  mouth at bedtime.     amiodarone (PACERONE) 200 MG tablet Take 1 tablet (200 mg total) by mouth daily. 90 tablet 0   amLODipine (NORVASC) 10 MG tablet Take 1 tablet (10 mg total) by mouth daily. (Patient taking differently: Take 5 mg by mouth daily.) 180 tablet 3   Apremilast (OTEZLA) 30 MG TABS Take 1 tablet (30 mg total) by mouth 2 (two) times daily. 180 tablet 3   aspirin EC 81 MG tablet Take 1 tablet (81 mg total) by mouth daily. Swallow whole.     atorvastatin (LIPITOR) 20 MG tablet Take 1 tablet (20 mg total) by mouth daily. for cholesterol. 90 tablet 1   Calcium-Magnesium-Vitamin D (CALCIUM 1200+D3 PO) Take 1 tablet by mouth daily.     Cholecalciferol (DIALYVITE VITAMIN D 5000) 125 MCG (5000 UT) capsule Take 5,000 Units by mouth daily.     clopidogrel (PLAVIX) 75 MG tablet Take 1 tablet (75 mg total) by mouth daily with breakfast. 90 tablet 0   cyanocobalamin 1000 MCG tablet Take 1,000 mcg by mouth daily. Vit B12     DULoxetine (CYMBALTA) 30 MG capsule Take 1 capsule (30 mg total) by mouth daily. For joint pain. 90 capsule 0   FARXIGA 10 MG TABS tablet Take 10 mg by mouth every morning.     furosemide (LASIX) 20 MG tablet  TAKE 1 TABLET(20 MG) BY MOUTH DAILY AS NEEDED 90 tablet 3   ibandronate (BONIVA) 150 MG tablet Take 1 tablet (150 mg total) by mouth every 30 (thirty) days. Take in the morning with a full glass of water, on an empty stomach, and do not take anything else by mouth or lie down for the next 30 min. 3 tablet 3   losartan (COZAAR) 100 MG tablet Take 1 tablet (100 mg total) by mouth daily. for blood pressure 90 tablet 1   magnesium oxide (MAG-OX) 400 (240 Mg) MG tablet TAKE 1 TABLET(400 MG) BY MOUTH DAILY 90 tablet 0   ondansetron (ZOFRAN-ODT) 4 MG disintegrating tablet Take 1 tablet (4 mg total) by mouth every 8 (eight) hours as needed for nausea or vomiting. 30 tablet 0   No current facility-administered medications on file prior to visit.    BP (!) 148/78   Pulse 75   Temp 97.8 F (36.6 C) (Temporal)   Ht 5\' 3"  (1.6 m)   Wt 156 lb (70.8 kg)   SpO2 100%   BMI 27.63 kg/m  Objective:   Physical Exam Cardiovascular:     Rate and Rhythm: Normal rate and regular rhythm.  Pulmonary:     Effort: Pulmonary effort is normal.     Breath sounds: Normal breath sounds.  Musculoskeletal:     Cervical back: Neck supple.  Skin:    General: Skin is warm and dry.  Neurological:     Mental Status: She is alert and oriented to person, place, and time.  Psychiatric:        Mood and Affect: Mood normal.           Assessment & Plan:  Superior mesenteric artery stenosis [facility]) Assessment & Plan: Following with cardiology. Reviewed office notes from December 2024.     Nausea and vomiting, unspecified vomiting type Assessment & Plan: Post prandial. Improving.  Her weight is also stable.   Continue to watch diet.  Continue Zofran OTD 4 mg PRN.  She will schedule a follow up visit with GI.         [name], NP

## 2023-09-03 NOTE — Assessment & Plan Note (Signed)
Following with cardiology. Reviewed office notes from December 2024.

## 2023-09-03 NOTE — Assessment & Plan Note (Signed)
Post prandial. Improving.  Her weight is also stable.   Continue to watch diet.  Continue Zofran OTD 4 mg PRN.  She will schedule a follow up visit with GI.

## 2023-09-03 NOTE — Patient Instructions (Addendum)
Schedule a follow up visit with Dr. Meridee Score the GI doctor.  Continue to work on [employer].  Use the antinausea medicine as needed.  It was a pleasure to see you today!

## 2023-09-05 ENCOUNTER — Other Ambulatory Visit: Payer: Self-pay | Admitting: Primary Care

## 2023-09-05 DIAGNOSIS — K219 Gastro-esophageal reflux disease without esophagitis: Secondary | ICD-10-CM

## 2023-09-06 ENCOUNTER — Ambulatory Visit: Payer: Medicare Other | Admitting: Primary Care

## 2023-09-07 ENCOUNTER — Encounter: Payer: Self-pay | Admitting: Cardiology

## 2023-09-07 ENCOUNTER — Ambulatory Visit: Payer: Medicare Other | Attending: Cardiology | Admitting: Cardiology

## 2023-09-07 ENCOUNTER — Other Ambulatory Visit: Payer: Self-pay | Admitting: Neurology

## 2023-09-07 VITALS — BP 148/70 | HR 74 | Ht 63.0 in | Wt 152.0 lb

## 2023-09-07 DIAGNOSIS — R600 Localized edema: Secondary | ICD-10-CM

## 2023-09-07 DIAGNOSIS — I480 Paroxysmal atrial fibrillation: Secondary | ICD-10-CM

## 2023-09-07 DIAGNOSIS — Z95818 Presence of other cardiac implants and grafts: Secondary | ICD-10-CM

## 2023-09-07 DIAGNOSIS — I10 Essential (primary) hypertension: Secondary | ICD-10-CM

## 2023-09-07 NOTE — Progress Notes (Signed)
 Cardiology Office Note Date:  09/07/2023  Patient ID:  Amy Underwood, Amy Underwood 08/27/1946, MRN [id number] PCP:  Gretta Comer POUR, NP  Cardiologist:  None Electrophysiologist: OLE ONEIDA HOLTS, MD   Chief Complaint: afib follow-up  History of Present Illness: Amy Underwood is a 78 y.o. female with PMH notable for persis Afib, s/p LAAO with watchman, HFpEF, chronic mesenteric ischemia s/p recent stent, HTN; seen today for OLE ONEIDA HOLTS, MD for routine EP follow-up.   She is s/p atypical clockwise flutter ablation w CTI, AF ablation with PVI, Posterior wall on 01/2022.  She is s/p LAAO with watchman 04/2022. Tikosyn  stopped 11/2022 d/t no AFib. She last saw Dr. Holts 02/2023 was doing well off tikosyn .  She has since had reoccurrence of AFib. She was not interested in reloading of tikosyn  and so amiodarone  was started.   On follow-up today, she is doing well from an arrythmia perspective. She usually wears an apple watch, but is not today. She has had brief episodes of palpitations - corresponds to afib on her watch. Episodes last a couple seconds, no SOB, chest pain, presyncope during episodes.  Her main complaint today is ongoing borborygmus that is very loud and bothersome to her. It has significantly worsened since her SMA stent placement. She also has ongoing abd pain, nausea with dry heaves. She was hopeful that her symptoms would improve after stent placement, but they have not. PCP has recommended she see GI again for further eval.   She has had increased edema as of the past month. She took her PRN lasix  3 days in a row with improvement in edema, but it is still present. Her PCP questions whether her amlodipine  dose is the cause of increased edema.  She checks BP regularly, most readings 130-140/70-80   AAD History: Tikosyn  - stopped 11/2022 d/t no AFib Amiodarone  - started 06/2023    ROS:  Please see the history of present illness. All other systems are reviewed and otherwise  negative.   PHYSICAL EXAM:  VS:  BP (!) 148/70 (BP Location: Left Arm, Patient Position: Sitting, Cuff Size: Normal)   Pulse 74   Ht 5' 3 (1.6 m)   Wt 152 lb (68.9 kg)   SpO2 98%   BMI 26.93 kg/m  BMI: Body mass index is 26.93 kg/m.  Wt Readings from Last 3 Encounters:  09/07/23 152 lb (68.9 kg)  09/03/23 156 lb (70.8 kg)  08/09/23 150 lb 12.8 oz (68.4 kg)       GEN- The patient is well appearing, alert and oriented x 3 today.   Lungs- Clear to ausculation bilaterally, normal work of breathing.  Heart- Regular rate and rhythm, no murmurs, rubs or gallops Extremities- 1-2+ L>R peripheral edema, warm, dry   EKG is ordered. Personal review of EKG from today shows:   EKG Interpretation Date/Time:  Friday September 07 2023 13:57:25 EST Ventricular Rate:  74 PR Interval:    QRS Duration:  66 QT Interval:  324 QTC Calculation: 359 R Axis:   -58  Text Interpretation: Accelerated Junctional rhythm versus sinus rhythm with 1st deg AV block Poor data quality, interpretation may be adversely affected Left axis deviation Low voltage QRS Confirmed by Issabela Lesko [phone]) on 09/07/2023 3:37:49 PM     Additional studies reviewed include: Previous EP, cardiology notes.   Long term monitor, 05/28/2023 HR 51 - 245, average 94 bpm. 80% burden of AF/AFL, average 99 bpm. 37 SVT, longest 13.2 seconds with an average rate of 128 bpm.  AF/AFL present at the deactivation of device. Rare supraventricular and ventricular ectopy.  CT cardiac, 06/22/2022 1. Well placed 31 mm Watchman FLX with no leak, full endothelialization and average compression 18%  2.  Moderate bi atrial enlargement  3. 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  Normal ascending thoracic aorta 3.5 cm  5.  No perciardial effusion  6.  Normal PV anatomy measurements above  TTE, 10/27/2021  1. Left ventricular ejection fraction, by estimation, is 60 to 65%. Left ventricular ejection fraction  by 2D MOD biplane is 63.8 %. The left ventricle has normal function. The left ventricle has no regional wall motion abnormalities. There is moderate left ventricular hypertrophy of the basal-septal segment. Left ventricular diastolic parameters are consistent with Grade II diastolic dysfunction (pseudonormalization). The average left ventricular global longitudinal strain is -17.8 %. The global longitudinal strain is normal.   2. Right ventricular systolic function is normal. The right ventricular size is normal. There is normal pulmonary artery systolic pressure.   3. Left atrial size was mildly dilated.   4. The mitral valve is degenerative. Mild mitral valve regurgitation.   5. The aortic valve is calcified. Aortic valve regurgitation is mild. Mild aortic valve stenosis. Aortic valve mean gradient measures 8.0 mmHg.   6. The inferior vena cava is normal in size with greater than 50% respiratory variability, suggesting right atrial pressure of 3 mmHg.    ASSESSMENT AND PLAN:  #) parox AFib #) high risk medication use - amiodarone  S/p ablation 01/2022 Recent zio with significant AFib/flutter burden Today's EKG concerning for accelerated junctional rhythm. Reviewing her previous EKG's, her p-waves are exquisitely small, so unclear whether today's EKG is junctional vs sinus   Reduce amiodarone  to 100mg  daily Update amio labs today She desires to reduce her daily pill burden, if possible. We discussed redo ablation, and she is open to further discussion with Dr. Cindie. Final decision regarding redo ablation per MD.   #) LAAO with Watchman Watchman well-seated, no OAC indicated   #) HTN #) lower extremity edema Slightly elevated BP in office and by home readings  She has tolerated amlodipine  10mg  daily for several years, and does not prefer reducing dose and adding new medication today Recommended she take PRN lasix  more regularly for increased lower extremity edema Recommended reduction in  dietary sodium, more physical activity with walking Consider compression socks Continue 100mg  losartan  daily Update TTE to confirm preserved LVEF     Current medicines are reviewed at length with the patient today.   The patient has concerns regarding her medicines.  The following changes were made today:   Reduce amiodarone  to 100mg  daily  Labs/ tests ordered today include:  Orders Placed This Encounter  Procedures   T4, free   TSH   Magnesium    Comprehensive metabolic panel   CBC   EKG 12-Lead   ECHOCARDIOGRAM COMPLETE     Disposition: Follow up with Dr. Cindie in  4-6 weeks    Signed, Chantal Needle, NP  09/07/23  3:39 PM  Electrophysiology [facility]

## 2023-09-07 NOTE — Patient Instructions (Addendum)
 Medication Instructions:  Your physician recommends that you continue on your current medications as directed. Please refer to the Current Medication list given to you today.   *If you need a refill on your cardiac medications before your next appointment, please call your pharmacy*   Lab Work: Your provider would like for you to have following labs drawn today (CBC, CMP, Mg, TSH, T4).     Testing/Procedures: Your physician has requested that you have an echocardiogram. Echocardiography is a painless test that uses sound waves to create images of your heart. It provides your doctor with information about the size and shape of your heart and how well your heart's chambers and valves are working.   You may receive an ultrasound enhancing agent through an IV if needed to better visualize your heart during the echo. This procedure takes approximately one hour.  There are no restrictions for this procedure.  This will take place at [address] [facility] [address], [place] [address]  Please note: We ask at that you not bring children with you during ultrasound (echo/ vascular) testing. Due to room size and safety concerns, children are not allowed in the ultrasound rooms during exams. Our front office staff cannot provide observation of children in our lobby area while testing is being conducted. An adult accompanying a patient to their appointment will only be allowed in the ultrasound room at the discretion of the ultrasound technician under special circumstances. We apologize for any inconvenience.    Follow-Up: At [facility], you and your health needs are our priority.  As part of our continuing mission to provide you with exceptional heart care, we have created designated Provider Care Teams.  These Care Teams include your primary Cardiologist (physician) and Advanced Practice Providers (APPs -  Physician Assistants and Nurse Practitioners) who all work together to  provide you with the care you need, when you need it.  We recommend signing up for the patient portal called MyChart.  Sign up information is provided on this After Visit Summary.  MyChart is used to connect with patients for Virtual Visits (Telemedicine).  Patients are able to view lab/test results, encounter notes, upcoming appointments, etc.  Non-urgent messages can be sent to your provider as well.   To learn more about what you can do with MyChart, go to [email or web address].    Your next appointment:   1 month(s)  Provider:   Ole Holts, MD

## 2023-09-08 LAB — CBC
Hematocrit: 34.5 % (ref 34.0–46.6)
Hemoglobin: 11.2 g/dL (ref 11.1–15.9)
MCH: 30.1 pg (ref 26.6–33.0)
MCHC: 32.5 g/dL (ref 31.5–35.7)
MCV: 93 fL (ref 79–97)
Platelets: 191 10*3/uL (ref 150–450)
RBC: 3.72 x10E6/uL — ABNORMAL LOW (ref 3.77–5.28)
RDW: 14.4 % (ref 11.7–15.4)
WBC: 4.3 10*3/uL (ref 3.4–10.8)

## 2023-09-08 LAB — COMPREHENSIVE METABOLIC PANEL
ALT: 18 [IU]/L (ref 0–32)
AST: 22 [IU]/L (ref 0–40)
Albumin: 3.9 g/dL (ref 3.8–4.8)
Alkaline Phosphatase: 110 [IU]/L (ref 44–121)
BUN/Creatinine Ratio: 17 (ref 12–28)
BUN: 35 mg/dL — ABNORMAL HIGH (ref 8–27)
Bilirubin Total: 0.5 mg/dL (ref 0.0–1.2)
CO2: 27 mmol/L (ref 20–29)
Calcium: 8.9 mg/dL (ref 8.7–10.3)
Chloride: 105 mmol/L (ref 96–106)
Creatinine, Ser: 2.01 mg/dL — ABNORMAL HIGH (ref 0.57–1.00)
Globulin, Total: 1.8 g/dL (ref 1.5–4.5)
Glucose: 127 mg/dL — ABNORMAL HIGH (ref 70–99)
Potassium: 4.1 mmol/L (ref 3.5–5.2)
Sodium: 145 mmol/L — ABNORMAL HIGH (ref 134–144)
Total Protein: 5.7 g/dL — ABNORMAL LOW (ref 6.0–8.5)
eGFR: 25 mL/min/{1.73_m2} — ABNORMAL LOW (ref >59)

## 2023-09-08 LAB — T4, FREE: Free T4: 1.5 ng/dL (ref 0.82–1.77)

## 2023-09-08 LAB — MAGNESIUM: Magnesium: 2.2 mg/dL (ref 1.6–2.3)

## 2023-09-08 LAB — TSH: TSH: 1.85 u[IU]/mL (ref 0.450–4.500)

## 2023-09-10 ENCOUNTER — Other Ambulatory Visit: Payer: Self-pay | Admitting: Emergency Medicine

## 2023-09-11 NOTE — Telephone Encounter (Signed)
  The original prescription was discontinued on 07/25/2023 by [name], NP    Did you still want the pt to continue taking the medication? Thanks,  Production assistant, radio

## 2023-09-19 ENCOUNTER — Telehealth: Payer: Self-pay

## 2023-09-19 NOTE — Telephone Encounter (Signed)
 The patient called in to schedule appointment. She is currently a patient of [facility]. She wants to transfer care because she has been seen at [facility] for spit up form, nausea, gagging and decrease weight lost. She said that she doesn't feel like she is getting the best care because she is still having the same problems. She said that our practice is only 5 miles from her home. She is 78 years old, and she don't drive that good anymore.

## 2023-09-21 ENCOUNTER — Ambulatory Visit (INDEPENDENT_AMBULATORY_CARE_PROVIDER_SITE_OTHER): Payer: Medicare Other | Admitting: Primary Care

## 2023-09-21 ENCOUNTER — Ambulatory Visit
Admission: RE | Admit: 2023-09-21 | Discharge: 2023-09-21 | Disposition: A | Discharge status: Home / Self Care | Payer: Medicare Other | Source: Ambulatory Visit | Attending: Primary Care

## 2023-09-21 ENCOUNTER — Other Ambulatory Visit: Payer: Self-pay | Admitting: Primary Care

## 2023-09-21 ENCOUNTER — Encounter: Payer: Self-pay | Admitting: Primary Care

## 2023-09-21 ENCOUNTER — Other Ambulatory Visit: Payer: Medicare Other

## 2023-09-21 VITALS — BP 136/68 | HR 65 | Temp 97.1°F | Ht 63.0 in | Wt 148.0 lb

## 2023-09-21 DIAGNOSIS — R1010 Upper abdominal pain, unspecified: Secondary | ICD-10-CM

## 2023-09-21 HISTORY — DX: Upper abdominal pain, unspecified: R10.10

## 2023-09-21 LAB — COMPREHENSIVE METABOLIC PANEL
ALT: 27 U/L (ref 0–35)
AST: 35 U/L (ref 0–37)
Albumin: 4 g/dL (ref 3.5–5.2)
Alkaline Phosphatase: 257 U/L — ABNORMAL HIGH (ref 39–117)
BUN: 36 mg/dL — ABNORMAL HIGH (ref 6–23)
CO2: 25 meq/L (ref 19–32)
Calcium: 9 mg/dL (ref 8.4–10.5)
Chloride: 106 meq/L (ref 96–112)
Creatinine, Ser: 1.63 mg/dL — ABNORMAL HIGH (ref 0.40–1.20)
GFR: 30.54 mL/min — ABNORMAL LOW (ref >60.00)
Glucose, Bld: 86 mg/dL (ref 70–99)
Potassium: 4 meq/L (ref 3.5–5.1)
Sodium: 140 meq/L (ref 135–145)
Total Bilirubin: 0.9 mg/dL (ref 0.2–1.2)
Total Protein: 6.2 g/dL (ref 6.0–8.3)

## 2023-09-21 LAB — CBC WITH DIFFERENTIAL/PLATELET
Basophils Absolute: 0 10*3/uL (ref 0.0–0.1)
Basophils Relative: 0.4 % (ref 0.0–3.0)
Eosinophils Absolute: 0 10*3/uL (ref 0.0–0.7)
Eosinophils Relative: 0.8 % (ref 0.0–5.0)
HCT: 35.1 % — ABNORMAL LOW (ref 36.0–46.0)
Hemoglobin: 11.4 g/dL — ABNORMAL LOW (ref 12.0–15.0)
Lymphocytes Relative: 14.5 % (ref 12.0–46.0)
Lymphs Abs: 0.9 10*3/uL (ref 0.7–4.0)
MCHC: 32.4 g/dL (ref 30.0–36.0)
MCV: 91.8 fL (ref 78.0–100.0)
Monocytes Absolute: 0.7 10*3/uL (ref 0.1–1.0)
Monocytes Relative: 11.7 % (ref 3.0–12.0)
Neutro Abs: 4.3 10*3/uL (ref 1.4–7.7)
Neutrophils Relative %: 72.6 % (ref 43.0–77.0)
Platelets: 216 10*3/uL (ref 150.0–400.0)
RBC: 3.83 Mil/uL — ABNORMAL LOW (ref 3.87–5.11)
RDW: 16.4 % — ABNORMAL HIGH (ref 11.5–15.5)
WBC: 5.9 10*3/uL (ref 4.0–10.5)

## 2023-09-21 LAB — LIPASE: Lipase: 99 U/L — ABNORMAL HIGH (ref 11.0–59.0)

## 2023-09-21 LAB — POCT I-STAT CREATININE: Creatinine, Ser: 1.8 mg/dL — ABNORMAL HIGH (ref 0.44–1.00)

## 2023-09-21 NOTE — Patient Instructions (Signed)
Stop by the lab prior to leaving today. I will notify you of your results once received.   You will receive a phone call regarding the CT scan.  It was a pleasure to see you today!

## 2023-09-21 NOTE — Progress Notes (Signed)
Subjective:    Patient ID: Amy Underwood, female    DOB: 11/14/1945, 78 y.o.   MRN: [id number]  GI Problem The primary symptoms include abdominal pain, nausea, vomiting and diarrhea. Primary symptoms do not include fever.    Amy Underwood is a very pleasant 78 y.o. female with a history of hypertension, paroxysmal atrial fibrillation, asthma, chronic mesenteric ischemia, GERD, dysphagia, nausea and vomiting, rectal bleeding, CKD who presents today for ongoing GI symptoms.  She continues to experience epigastric and bilateral upper abdominal pain, nausea, vomiting. Also with recurrent sour belching. Her pain is constant to the epigastric region. She experiences nausea with vomiting anytime she drinks or eats anything. Over the last three days she's experienced 1-3 episodes of diarrhea daily, last episode was yesterday at 12pm. She has not eaten anything in three days due to her symptoms.   She follows with GI, called her GI doctor's office who told her that she cannot be seen until March 2025. She completed an upper endoscopy in September 2024 which was normal.   She does take something for heartburn, isn't sure. Previously managed on omeprazole and pantoprazole from chart review. She is managed on Boniva, has never experienced side effects previously.   She denies fevers, chills, body aches.   BP Readings from Last 3 Encounters:  09/21/23 136/68  09/07/23 (!) 148/70  09/03/23 (!) 148/78   Wt Readings from Last 3 Encounters:  09/21/23 148 lb (67.1 kg)  09/07/23 152 lb (68.9 kg)  09/03/23 156 lb (70.8 kg)     Review of Systems  Constitutional:  Negative for fever.  Respiratory:  Negative for shortness of breath.   Cardiovascular:  Negative for chest pain.  Gastrointestinal:  Positive for abdominal pain, diarrhea, nausea and vomiting. Negative for blood in stool.         Past Medical History:  Diagnosis Date   Adhesive capsulitis of right shoulder    Asthma    Benign  hypertensive kidney disease with chronic kidney disease 07/11/2021   Bilateral hand numbness 08/08/2022   Chronic nausea 04/28/2023   Hyperlipidemia    Hypertension    Iron deficiency anemia    Leukopenia 09/23/2014   Osteoarthritis    Osteoarthritis of hip 09/23/2014   Paroxysmal atrial fibrillation (HCC)    Pernicious anemia    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   Watchman FLX 31mm with Dr. Lalla Brothers   Psoriasis    Unintentional weight loss 04/28/2023    Social History   Socioeconomic History   Marital status: Widowed    Spouse name: Not on file   Number of children: 2   Years of education: Not on file   Highest education level: GED or equivalent  Occupational History   Occupation: retired  Tobacco Use   Smoking status: Former    Current packs/day: 2.00    Average packs/day: 2.0 packs/day for 25.4 years (50.9 ttl pk-yrs)    Types: Cigarettes    Start date: 2001   Smokeless tobacco: Former  Building services engineer status: Never Used  Substance and Sexual Activity   Alcohol use: Never   Drug use: Never   Sexual activity: Not Currently  Other Topics Concern   Not on file  Social History Narrative   Not on file   Social Drivers of Health   Financial Resource Strain: Low Risk  (09/02/2023)   Overall Financial Resource Strain (CARDIA)    Difficulty of Paying Living Expenses: Not very hard  Food Insecurity: No Food Insecurity (09/02/2023)   Hunger Vital Sign    Worried About Running Out of Food in the Last Year: Never true    Ran Out of Food in the Last Year: Never true  Transportation Needs: No Transportation Needs (09/02/2023)   PRAPARE - Administrator, Civil Service (Medical): No    Lack of Transportation (Non-Medical): No  Physical Activity: Insufficiently Active (09/02/2023)   Exercise Vital Sign    Days of Exercise per Week: 3 days    Minutes of Exercise per Session: 20 min  Stress: Stress Concern Present  (09/02/2023)   [employer] of Occupational Health - Occupational Stress Questionnaire    Feeling of Stress : To some extent  Social Connections: Moderately Isolated (09/02/2023)   Social Connection and Isolation Panel [NHANES]    Frequency of Communication with Friends and Family: More than three times a week    Frequency of Social Gatherings with Friends and Family: Once a week    Attends Religious Services: 1 to 4 times per year    Active Member of [employer] or Organizations: No    Attends Banker Meetings: Never    Marital Status: Widowed  Intimate Partner Violence: Not At Risk (05/02/2023)   Humiliation, Afraid, Rape, and Kick questionnaire    Fear of Current or Ex-Partner: No    Emotionally Abused: No    Physically Abused: No    Sexually Abused: No    Past Surgical History:  Procedure Laterality Date   ATRIAL FIBRILLATION ABLATION N/A 01/03/2022   Procedure: ATRIAL FIBRILLATION ABLATION;  Surgeon: Lanier Prude, MD;  [facility];  Service: Cardiovascular;  Laterality: N/A;   CARPAL TUNNEL RELEASE Right 01/18/2023   Procedure: CARPAL TUNNEL RELEASE;  Surgeon: Jones Broom, MD;  Location: [facility];  Service: Orthopedics;  Laterality: Right;   CESAREAN SECTION     3   CHOLECYSTECTOMY     HIP ARTHROPLASTY Left    KNEE ARTHROPLASTY Bilateral    LEFT ATRIAL APPENDAGE OCCLUSION N/A 04/20/2022   Procedure: LEFT ATRIAL APPENDAGE OCCLUSION;  Surgeon: Lanier Prude, MD;  [facility];  Service: Cardiovascular;  Laterality: N/A;   PERIPHERAL VASCULAR INTERVENTION  07/18/2023   Procedure: PERIPHERAL VASCULAR INTERVENTION;  Surgeon: Iran Ouch, MD;  [facility];  Service: Cardiovascular;;   REVERSE SHOULDER ARTHROPLASTY Right 01/18/2023   Procedure: REVERSE SHOULDER ARTHROPLASTY;  Surgeon: Jones Broom, MD;  Location: [facility];  Service: Orthopedics;  Laterality: Right;   TEE WITHOUT CARDIOVERSION N/A 04/20/2022    Procedure: TRANSESOPHAGEAL ECHOCARDIOGRAM (TEE);  Surgeon: Lanier Prude, MD;  [facility];  Service: Cardiovascular;  Laterality: N/A;   VISCERAL ANGIOGRAPHY N/A 07/18/2023   Procedure: MESENTRIC  ANGIOGRAPHY;  Surgeon: Iran Ouch, MD;  [facility];  Service: Cardiovascular;  Laterality: N/A;    Family History  Problem Relation Age of Onset   Heart disease Mother    Breast cancer Neg Hx    Colon cancer Neg Hx    Esophageal cancer Neg Hx    Inflammatory bowel disease Neg Hx    Liver disease Neg Hx    Pancreatic cancer Neg Hx    Rectal cancer Neg Hx    Stomach cancer Neg Hx     Allergies  Allergen Reactions   Alendronate Palpitations   Cefaclor Hives and Rash    Current Outpatient Medications on File Prior to Visit  Medication  Sig Dispense Refill   allopurinol (ZYLOPRIM) 100 MG tablet Take 100 mg by mouth at bedtime.     amiodarone (PACERONE) 200 MG tablet Take 1 tablet (200 mg total) by mouth daily. 90 tablet 0   amLODipine (NORVASC) 10 MG tablet Take 1 tablet (10 mg total) by mouth daily. (Patient taking differently: Take 5 mg by mouth daily.) 180 tablet 3   Apremilast (OTEZLA) 30 MG TABS Take 1 tablet (30 mg total) by mouth 2 (two) times daily. 180 tablet 3   aspirin EC 81 MG tablet Take 1 tablet (81 mg total) by mouth daily. Swallow whole.     atorvastatin (LIPITOR) 20 MG tablet Take 1 tablet (20 mg total) by mouth daily. for cholesterol. 90 tablet 1   Calcium-Magnesium-Vitamin D (CALCIUM 1200+D3 PO) Take 1 tablet by mouth daily.     Cholecalciferol (DIALYVITE VITAMIN D 5000) 125 MCG (5000 UT) capsule Take 5,000 Units by mouth daily.     clopidogrel (PLAVIX) 75 MG tablet Take 1 tablet (75 mg total) by mouth daily with breakfast. 90 tablet 0   cyanocobalamin 1000 MCG tablet Take 1,000 mcg by mouth daily. Vit B12     DULoxetine (CYMBALTA) 30 MG capsule Take 1 capsule (30 mg total) by mouth daily. For joint pain. 90 capsule 0    DULoxetine (CYMBALTA) 60 MG capsule TAKE 1 CAPSULE(60 MG) BY MOUTH DAILY 90 capsule 3   FARXIGA 10 MG TABS tablet Take 10 mg by mouth every morning.     furosemide (LASIX) 20 MG tablet TAKE 1 TABLET(20 MG) BY MOUTH DAILY AS NEEDED 90 tablet 3   ibandronate (BONIVA) 150 MG tablet Take 1 tablet (150 mg total) by mouth every 30 (thirty) days. Take in the morning with a full glass of water, on an empty stomach, and do not take anything else by mouth or lie down for the next 30 min. 3 tablet 3   losartan (COZAAR) 100 MG tablet Take 1 tablet (100 mg total) by mouth daily. for blood pressure 90 tablet 1   magnesium oxide (MAG-OX) 400 (240 Mg) MG tablet TAKE 1 TABLET(400 MG) BY MOUTH DAILY 90 tablet 0   ondansetron (ZOFRAN-ODT) 4 MG disintegrating tablet Take 1 tablet (4 mg total) by mouth every 8 (eight) hours as needed for nausea or vomiting. 30 tablet 0   No current facility-administered medications on file prior to visit.    BP 136/68   Pulse 65   Temp (!) 97.1 F (36.2 C) (Temporal)   Ht 5\' 3"  (1.6 m)   Wt 148 lb (67.1 kg)   SpO2 98%   BMI 26.22 kg/m  Objective:   Physical Exam Cardiovascular:     Rate and Rhythm: Normal rate and regular rhythm.  Pulmonary:     Effort: Pulmonary effort is normal.     Breath sounds: Normal breath sounds.  Abdominal:     Tenderness: There is abdominal tenderness in the right upper quadrant, epigastric area and left upper quadrant.  Musculoskeletal:     Cervical back: Neck supple.  Skin:    General: Skin is warm and dry.  Neurological:     Mental Status: She is alert and oriented to person, place, and time.  Psychiatric:        Mood and Affect: Mood normal.           Assessment & Plan:  Pain of upper abdomen Assessment & Plan: Acute on chronic, progressing.  Given inability to eat, coupled with exam findings today,  will obtain stat CT abdomen/pelvis to review for any changes. Reviewed CT scan from July 2024.  Labs pending today including  CBC with differential, CMP, lipase.  Would also like to start PPI therapy.  She will notify us for what she is taking at home currently.  Await results.  Orders: -     CT ABDOMEN PELVIS W CONTRAST; Future -     CBC with Differential/Platelet -     Comprehensive metabolic panel -     Lipase        [name], NP

## 2023-09-21 NOTE — Assessment & Plan Note (Signed)
Acute on chronic, progressing.  Given inability to eat, coupled with exam findings today, will obtain stat CT abdomen/pelvis to review for any changes. Reviewed CT scan from July 2024.  Labs pending today including CBC with differential, CMP, lipase.  Would also like to start PPI therapy.  She will notify us for what she is taking at home currently.  Await results.

## 2023-09-22 DIAGNOSIS — R1010 Upper abdominal pain, unspecified: Secondary | ICD-10-CM

## 2023-09-24 ENCOUNTER — Ambulatory Visit: Payer: Medicare Other

## 2023-09-25 ENCOUNTER — Ambulatory Visit: Payer: Medicare Other | Admitting: Dermatology

## 2023-09-25 DIAGNOSIS — Z79899 Other long term (current) drug therapy: Secondary | ICD-10-CM

## 2023-09-25 DIAGNOSIS — L409 Psoriasis, unspecified: Secondary | ICD-10-CM

## 2023-09-25 DIAGNOSIS — L729 Follicular cyst of the skin and subcutaneous tissue, unspecified: Secondary | ICD-10-CM

## 2023-09-25 DIAGNOSIS — L720 Epidermal cyst: Secondary | ICD-10-CM | POA: Diagnosis not present

## 2023-09-25 DIAGNOSIS — L820 Inflamed seborrheic keratosis: Secondary | ICD-10-CM

## 2023-09-25 DIAGNOSIS — Z7189 Other specified counseling: Secondary | ICD-10-CM

## 2023-09-25 MED ORDER — OTEZLA 30 MG PO TABS: 30.0000 mg | ORAL_TABLET | Freq: Two times a day (BID) | ORAL | 3 refills | Status: DC

## 2023-09-25 NOTE — Patient Instructions (Addendum)

## 2023-09-25 NOTE — Progress Notes (Signed)
Follow-Up Visit   Subjective  Amy Underwood is a 78 y.o. female who presents for the following: Yearly f/u on psoriasis on the elbows, knees and hands,  taking Otezla 30 mg tablet daily with a good response- skin mainly clear, no headaches, no dizziness, no headaches, no diarrhea, no side effects.  The patient has spots, moles and lesions to be evaluated, some may be new or changing and the patient may have concern these could be cancer.  The following portions of the chart were reviewed this encounter and updated as appropriate: medications, allergies, medical history  Review of Systems:  No other skin or systemic complaints except as noted in HPI or Assessment and Plan.  Objective  Well appearing patient in no apparent distress; mood and affect are within normal limits.  A focused examination was performed of the following areas:face,back,arms   Relevant exam findings are noted in the Assessment and Plan.  left superior scapula x 2 (2) Stuck-on, waxy, tan-brown papules and plaques -- Discussed benign etiology and prognosis.   Assessment & Plan  PSORIASIS Exam: Well-demarcated erythematous papules/plaques with silvery scale, guttate pink scaly papules. 2% BSA. Chronic condition with duration or expected duration over one year. Currently better controlled on [place].  Pt is pleased.   Psoriasis is a chronic non-curable, but treatable genetic/hereditary disease that may have other systemic features affecting other organ systems such as joints (Psoriatic Arthritis). It is associated with an increased risk of inflammatory bowel disease, heart disease, non-alcoholic fatty liver disease, and depression.  Treatments include light and laser treatments; topical medications; and systemic medications including oral and injectables.  Treatment Plan:  Continue Otezla 30 mg tablet daily   Side effects of Otezla (apremilast) include diarrhea, nausea, headache, upper respiratory infection,  depression, and weight decrease (5-10%). It should only be taken by pregnant women after a discussion regarding risks and benefits with their doctor. Goal is control of skin condition, not cure.  The use of [place] requires long term medication management, including periodic office visits.    Continue Triamcinolone cream prn   Topical steroids (such as triamcinolone, fluocinolone, fluocinonide, mometasone, clobetasol, halobetasol, betamethasone, hydrocortisone) can cause thinning and lightening of the skin if they are used for too long in the same area. Your physician has selected the right strength medicine for your problem and area affected on the body. Please use your medication only as directed by your physician to prevent side effects.     EPIDERMAL INCLUSION CYST Exam:  ~0.6 cm Subcutaneous nodule at  Left top of shoulder and left upper back paraspinal   Benign-appearing. Exam most consistent with an epidermal inclusion cyst. Discussed that a cyst is a benign growth that can grow over time and sometimes get irritated or inflamed. Recommend observation if it is not bothersome. Discussed option of surgical excision to remove it if it is growing, symptomatic, or other changes noted. Please call for new or changing lesions so they can be evaluated.   INFLAMED SEBORRHEIC KERATOSIS (2) left superior scapula x 2 (2) Symptomatic, irritating, patient would like treated.  Destruction of lesion - left superior scapula x 2 (2)  Destruction method: cryotherapy   Informed consent: discussed and consent obtained   Lesion destroyed using liquid nitrogen: Yes   Region frozen until ice ball extended beyond lesion: Yes   Outcome: patient tolerated procedure well with no complications   Post-procedure details: wound care instructions given   Additional details:  Prior to procedure, discussed risks of blister formation,  small wound, skin dyspigmentation, or rare scar following cryotherapy. Recommend  Vaseline ointment to treated areas while healing.   Return in about 6 months (around 03/24/2024) for Psoriasis .  IAngelique Holm, CMA, am acting as scribe for Armida Sans, MD .   Documentation: I have reviewed the above documentation for accuracy and completeness, and I agree with the above.  Armida Sans, MD

## 2023-09-26 ENCOUNTER — Ambulatory Visit: Payer: Medicare Other | Admitting: Dermatology

## 2023-10-09 ENCOUNTER — Ambulatory Visit: Payer: Medicare Other | Attending: Cardiology

## 2023-10-09 DIAGNOSIS — I480 Paroxysmal atrial fibrillation: Secondary | ICD-10-CM

## 2023-10-09 DIAGNOSIS — R600 Localized edema: Secondary | ICD-10-CM | POA: Diagnosis not present

## 2023-10-09 LAB — ECHOCARDIOGRAM COMPLETE
AV Mean grad: 7 mm[Hg]
AV Peak grad: 14 mm[Hg]
Ao pk vel: 1.87 m/s
Area-P 1/2: 3.85 cm2
S' Lateral: 2.6 cm

## 2023-10-10 ENCOUNTER — Other Ambulatory Visit: Payer: Medicare Other

## 2023-10-10 DIAGNOSIS — R1010 Upper abdominal pain, unspecified: Secondary | ICD-10-CM

## 2023-10-11 ENCOUNTER — Other Ambulatory Visit: Payer: Self-pay | Admitting: Cardiology

## 2023-10-11 LAB — H. PYLORI BREATH TEST: H. pylori Breath Test: NOT DETECTED

## 2023-10-14 ENCOUNTER — Other Ambulatory Visit: Payer: Self-pay | Admitting: Cardiology

## 2023-10-15 ENCOUNTER — Other Ambulatory Visit: Payer: Self-pay | Admitting: Cardiovascular Disease

## 2023-10-15 NOTE — Telephone Encounter (Signed)
 Hi,   Could you please advise if ok to refill prescription under Dr. Alvenia Aus as the prescribing physician? The medication was last prescribed by Heidi Llamas B. Adelene Homer, NP on 07/19/2023. Thank you so much.

## 2023-10-15 NOTE — Telephone Encounter (Signed)
 This is a Educational psychologist pt

## 2023-10-16 ENCOUNTER — Ambulatory Visit (INDEPENDENT_AMBULATORY_CARE_PROVIDER_SITE_OTHER): Payer: Medicare Other | Admitting: Podiatry

## 2023-10-16 ENCOUNTER — Encounter: Payer: Self-pay | Admitting: Dermatology

## 2023-10-16 ENCOUNTER — Encounter: Payer: Self-pay | Admitting: Podiatry

## 2023-10-16 DIAGNOSIS — S90852A Superficial foreign body, left foot, initial encounter: Secondary | ICD-10-CM | POA: Diagnosis not present

## 2023-10-16 NOTE — Progress Notes (Signed)
Chief Complaint  Patient presents with   Foot Pain    "I have a place on the bottom of my foot.  It hurts." N - place on foot hurts L - plantar heel left D - 2 weeks O - suddenly, gotten worse C - sharp pain, sore A - walking, sleeping T - none    HPI: 78 y.o. female presenting today as a new patient for evaluation of a symptomatic lesion to the plantar aspect of the left heel.  It is very tender and painful to touch.  Onset about 3-4 weeks ago.  Idiopathic onset.  She does admit to walking around the house barefoot.  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 Lalla Brothers   Psoriasis    Unintentional weight loss 04/28/2023    Past Surgical History:  Procedure Laterality Date   ATRIAL FIBRILLATION ABLATION N/A 01/03/2022   Procedure: ATRIAL FIBRILLATION ABLATION;  Surgeon: Lanier Prude, MD;  [facility];  Service: Cardiovascular;  Laterality: N/A;   CARPAL TUNNEL RELEASE Right 01/18/2023   Procedure: CARPAL TUNNEL RELEASE;  Surgeon: Jones Broom, MD;  Location: [facility];  Service: Orthopedics;  Laterality: Right;   CESAREAN SECTION     3   CHOLECYSTECTOMY     HIP ARTHROPLASTY Left    KNEE ARTHROPLASTY Bilateral    LEFT ATRIAL APPENDAGE OCCLUSION N/A 04/20/2022   Procedure: LEFT ATRIAL APPENDAGE OCCLUSION;  Surgeon: Lanier Prude, MD;  [facility];  Service: Cardiovascular;  Laterality: N/A;   PERIPHERAL VASCULAR INTERVENTION  07/18/2023   Procedure:  PERIPHERAL VASCULAR INTERVENTION;  Surgeon: Iran Ouch, MD;  [facility];  Service: Cardiovascular;;   REVERSE SHOULDER ARTHROPLASTY Right 01/18/2023   Procedure: REVERSE SHOULDER ARTHROPLASTY;  Surgeon: Jones Broom, MD;  Location: [facility];  Service: Orthopedics;  Laterality: Right;   TEE WITHOUT CARDIOVERSION N/A 04/20/2022   Procedure: TRANSESOPHAGEAL ECHOCARDIOGRAM (TEE);  Surgeon: Lanier Prude, MD;  Location: [facility] INVASIVE CV [facility];  Service: Cardiovascular;  Laterality: N/A;   VISCERAL ANGIOGRAPHY N/A 07/18/2023   Procedure: MESENTRIC  ANGIOGRAPHY;  Surgeon: Iran Ouch, MD;  [facility];  Service: Cardiovascular;  Laterality: N/A;    Allergies  Allergen Reactions   Alendronate Palpitations   Cefaclor Hives and Rash     Physical Exam: General: The patient is alert and oriented x3 in no acute distress.  Dermatology: Skin is warm, dry and supple bilateral lower extremities.  Hyperkeratotic callus tissue noted with a central nucleated core to the plantar aspect of the left heel.  After debridement of the superficial skin there is a foreign body embedded within the area.  No purulence.  Associated tenderness with palpation  Vascular: Palpable pedal pulses bilaterally. Capillary refill within normal limits.  No appreciable edema.  No erythema.  Neurological: Grossly intact via light touch  Musculoskeletal Exam: No pedal deformities noted.  There is associated tenderness with palpation to the plantar  heel  Assessment/Plan of Care: 1.  Foreign body plantar aspect of the left foot  -Patient evaluated -After evaluating the foot and light debridement of the overlying callus tissue there was an underlying foreign body embedded within the superficial part of the subcutaneous tissue past the dermis.  Decision was made to locally anesthetize the area to remove the foreign body.  The area was prepped aseptically and 2 mL of 2% lidocaine with epi  was infiltrated into the plantar heel.  The patient tolerated this well -Additional debridement and removal of the foreign body was performed using a tissue nipper without complication.  Light dressing applied.  Post care instructions provided -The area should heal uneventfully.  No scheduled follow-ups.  Return to clinic as needed       Felecia Shelling, DPM [facility]  Dr. Felecia Shelling, DPM    [address] [place], [place] [address]                Office [phone]  Fax [phone]

## 2023-10-17 ENCOUNTER — Ambulatory Visit: Payer: Medicare Other | Admitting: Cardiology

## 2023-10-19 ENCOUNTER — Ambulatory Visit: Payer: Medicare Other | Admitting: Podiatry

## 2023-10-23 ENCOUNTER — Other Ambulatory Visit: Payer: Self-pay | Admitting: Cardiology

## 2023-10-24 ENCOUNTER — Other Ambulatory Visit: Payer: Self-pay | Admitting: Primary Care

## 2023-10-24 DIAGNOSIS — J4520 Mild intermittent asthma, uncomplicated: Secondary | ICD-10-CM

## 2023-10-24 MED ORDER — ALBUTEROL SULFATE HFA 108 (90 BASE) MCG/ACT IN AERS: 2.0000 | INHALATION_SPRAY | Freq: Four times a day (QID) | RESPIRATORY_TRACT | 0 refills | Status: DC | PRN

## 2023-10-24 NOTE — Telephone Encounter (Unsigned)
 Copied from CRM [id number]. Topic: Clinical - Medication Refill >> Oct 24, 2023 10:23 AM Elizebeth Brooking wrote: Most Recent Primary Care Visit:  Provider: [facility]  Department: [facility]  Visit Type: LAB VISIT  Date: 10/10/2023  Medication: albuterol (VENTOLIN HFA) 108 (90 Base) MCG/ACT inhaler  Has the patient contacted their pharmacy? Yes (Agent: If no, request that the patient contact the pharmacy for the refill. If patient does not wish to contact the pharmacy document the reason why and proceed with request.) (Agent: If yes, when and what did the pharmacy advise?)  Is this the correct pharmacy for this prescription? Yes If no, delete pharmacy and type the correct one.  This is the patient's preferred pharmacy:  [id number] - [place], [place] - [address] AT [facility] [address] [facility] [address] [facility] [address] [place] [address] Phone: [phone] Fax: [phone]    Has the prescription been filled recently? No  Is the patient out of the medication? Yes  Has the patient been seen for an appointment in the last year OR does the patient have an upcoming appointment? Yes  Can we respond through MyChart? Yes  Agent: Please be advised that Rx refills may take up to 3 business days. We ask that you follow-up with your pharmacy.

## 2023-11-01 ENCOUNTER — Ambulatory Visit: Payer: Medicare Other | Admitting: Cardiovascular Disease

## 2023-11-10 ENCOUNTER — Other Ambulatory Visit: Payer: Self-pay | Admitting: Primary Care

## 2023-11-10 DIAGNOSIS — I10 Essential (primary) hypertension: Secondary | ICD-10-CM

## 2023-11-10 DIAGNOSIS — E782 Mixed hyperlipidemia: Secondary | ICD-10-CM

## 2023-11-13 NOTE — Progress Notes (Unsigned)
  Electrophysiology Office Follow up Visit Note:    Date:  11/14/2023   ID:  Amy Underwood, DOB [birth year], MRN [id number]  PCP:  [name], NP  [facility] Cardiologist:  None  [facility] Electrophysiologist:  Lanier Prude, MD    Interval History:     Amy Underwood is a 78 y.o. female who presents for a follow up visit.    The patient has a history of atrial fibrillation with a prior catheter ablation in May 2023.  She had a watchman implant August 2023.  The patient was most recently seen by Rosalita Chessman in clinic September 07, 2023.  At that appointment she was doing well.  Her EKG was concerning for junctional rhythm.  Today she reports doing well. No dizziness or lightheadedness. No palpitations.          Past medical, surgical, social and family history were reviewed.  ROS:   Please see the history of present illness.    All other systems reviewed and are negative.  EKGs/Labs/Other Studies Reviewed:    The following studies were reviewed today:     EKG Interpretation Date/Time:  Wednesday November 14 2023 11:23:35 EDT Ventricular Rate:  57 PR Interval:    QRS Duration:  74 QT Interval:  468 QTC Calculation: 455 R Axis:   -62  Text Interpretation: Junctional rhythm Confirmed by Steffanie Dunn [phone] on 11/14/2023 11:29:12 AM    Physical Exam:    VS:  BP (!) 160/68   Pulse (!) 57   Ht 5\' 3"  (1.6 m)   Wt 153 lb 3.2 oz (69.5 kg)   SpO2 99%   BMI 27.14 kg/m     Wt Readings from Last 3 Encounters:  11/14/23 153 lb 3.2 oz (69.5 kg)  09/21/23 148 lb (67.1 kg)  09/07/23 152 lb (68.9 kg)     GEN: no distress CARD: RRR, No MRG RESP: No IWOB. CTAB.   With ambulation, HR increased from 57 to 61 bpm. No symptoms with ambulation.     ASSESSMENT:    1. Paroxysmal atrial fibrillation (HCC)   2. Accelerated junctional rhythm    PLAN:    In order of problems listed above:  #Persistent atrial fibrillation #High risk drug  monitoring-amiodarone #Watchman in situ  Stop amiodarone today to see if her sinus rate picks up. Continue aspirin and Plavix  Follow-up with EP APP in 3 months. Needs ECG at that visit.  Signed, Steffanie Dunn, MD, [facility], [facility] 11/14/2023 11:38 AM    Electrophysiology [facility]

## 2023-11-14 ENCOUNTER — Ambulatory Visit: Payer: Medicare Other | Attending: Cardiology | Admitting: Cardiology

## 2023-11-14 ENCOUNTER — Encounter: Payer: Self-pay | Admitting: Cardiology

## 2023-11-14 VITALS — BP 160/68 | HR 57 | Ht 63.0 in | Wt 153.2 lb

## 2023-11-14 DIAGNOSIS — I498 Other specified cardiac arrhythmias: Secondary | ICD-10-CM | POA: Diagnosis not present

## 2023-11-14 DIAGNOSIS — I480 Paroxysmal atrial fibrillation: Secondary | ICD-10-CM | POA: Diagnosis not present

## 2023-11-14 NOTE — Patient Instructions (Signed)
 Medication Instructions:  Your physician has recommended you make the following change in your medication:  1) STOP taking amiodarone *If you need a refill on your cardiac medications before your next appointment, please call your pharmacy*   Follow-Up: At [facility], you and your health needs are our priority.  As part of our continuing mission to provide you with exceptional heart care, we have created designated Provider Care Teams.  These Care Teams include your primary Cardiologist (physician) and Advanced Practice Providers (APPs -  Physician Assistants and Nurse Practitioners) who all work together to provide you with the care you need, when you need it.  Your next appointment:   3 months  Provider:   Sherie Don, NP

## 2023-12-08 ENCOUNTER — Other Ambulatory Visit: Payer: Self-pay | Admitting: Cardiology

## 2023-12-12 ENCOUNTER — Other Ambulatory Visit: Payer: Self-pay | Admitting: Nurse Practitioner

## 2023-12-12 DIAGNOSIS — I10 Essential (primary) hypertension: Secondary | ICD-10-CM

## 2023-12-12 DIAGNOSIS — E782 Mixed hyperlipidemia: Secondary | ICD-10-CM

## 2023-12-13 NOTE — Telephone Encounter (Signed)
 LVM for patient to c/b and schedule.

## 2023-12-13 NOTE — Telephone Encounter (Signed)
 Patient is due for CPE/follow up, this will be required prior to any further refills.  Please schedule, thank you!

## 2023-12-14 NOTE — Telephone Encounter (Signed)
 Patient scheduled.

## 2023-12-17 ENCOUNTER — Ambulatory Visit (INDEPENDENT_AMBULATORY_CARE_PROVIDER_SITE_OTHER): Payer: Medicare Other | Admitting: Gastroenterology

## 2023-12-17 VITALS — BP 169/71 | HR 57 | Temp 98.7°F | Wt 150.0 lb

## 2023-12-17 DIAGNOSIS — R1084 Generalized abdominal pain: Secondary | ICD-10-CM

## 2023-12-17 DIAGNOSIS — R109 Unspecified abdominal pain: Secondary | ICD-10-CM | POA: Diagnosis not present

## 2023-12-17 DIAGNOSIS — K551 Chronic vascular disorders of intestine: Secondary | ICD-10-CM

## 2023-12-17 MED ORDER — DEXLANSOPRAZOLE 60 MG PO CPDR: 60.0000 mg | DELAYED_RELEASE_CAPSULE | Freq: Every day | ORAL | 3 refills | Status: DC

## 2023-12-17 NOTE — Progress Notes (Addendum)
 [name] MD, MRCP(U.K) [address]  [address]  [place], [place] [address]  Main: [phone]  Fax: [phone]   Gastroenterology Consultation  Referring Provider:     Gabriel John, NP Primary Care Physician:  Gabriel John, NP Primary Gastroenterologist:  Dr. [name]  Reason for Consultation:    Nausea gagging and weight loss.        HPI:   Amy Underwood is a 78 y.o. y/o female referred for consultation & management  by  Gabriel John, NP.    She was last seen by Dr. Christophe Cram in 04/24/2019 for for dilated bile duct dysphagia unintentional weight loss bowel habit changes nausea rectal bleeding history of gastric bypass superior mesenteric artery stenosis paroxysmal atrial fibrillation.  The gastric bypass a 15 to 17 years back for obesity.  At her visit with Dr. Christophe Cram he was most concerned about her dysphagia. 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  Similar dilation also seen in 2023.  Prominent atheromatous plaque in the SMA causing at least severe stenosis.  He recommended an endoscopy and he had felt that the dilated common bile duct was related to postcholecystectomy state and the plan was to evaluate with following her LFTs colonoscopy for rectal bleeding was not entertained due to her ageShe had an upper endoscopy on 05/11/2019 for no graft abnormality was noted empiric dilation to 18 mm was performed Schatzki's ring was disrupted with cold biopsy forceps evidence of gastric bypass was found.  There appeared normal.  Staples were taken out.  She says that for the past few years she has discomfort when she eats food because of heartburn predominantly noted occasionally black-colored stools.  Denies any NSAID use.  Denies any dysphagia presently.  Cannot tolerate large quantities of foods low-fat diet to help at times.   She takes Tums as needed for reflux but not regularly.  She would like to seek care locally as she finds it hard to drive. 09/21/2023: CT scan of the abdomen pelvis without contrast showed no abnormalities. 10/10/2023 H. pylori breath test was negative.,  Hemoglobin 11.3 g.  PTH was 69.  Alkaline phosphatase was 257.  Creatinine 1.63. 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 Marven Slimmer   Psoriasis    Unintentional weight loss 04/28/2023    Past Surgical History:  Procedure Laterality Date   ATRIAL FIBRILLATION ABLATION N/A 01/03/2022   Procedure: ATRIAL FIBRILLATION ABLATION;  Surgeon: Boyce Byes, MD;  Location: [facility];  Service: Cardiovascular;  Laterality: N/A;   CARPAL TUNNEL RELEASE Right 01/18/2023   Procedure: CARPAL TUNNEL RELEASE;  Surgeon: Sammye Cristal, MD;  Location: [facility];  Service: Orthopedics;  Laterality: Right;   CESAREAN SECTION     3   CHOLECYSTECTOMY     HIP ARTHROPLASTY Left    KNEE ARTHROPLASTY  Bilateral    LEFT ATRIAL APPENDAGE OCCLUSION N/A 04/20/2022   Procedure: LEFT ATRIAL APPENDAGE OCCLUSION;  Surgeon: Boyce Byes, MD;  Location: [facility];  Service: Cardiovascular;  Laterality: N/A;   PERIPHERAL VASCULAR INTERVENTION  07/18/2023   Procedure: PERIPHERAL VASCULAR INTERVENTION;  Surgeon: Wenona Hamilton, MD;  Location: [facility];  Service: Cardiovascular;;   REVERSE SHOULDER ARTHROPLASTY Right 01/18/2023   Procedure: REVERSE SHOULDER  ARTHROPLASTY;  Surgeon: Sammye Cristal, MD;  Location: [facility];  Service: Orthopedics;  Laterality: Right;   TEE WITHOUT CARDIOVERSION N/A 04/20/2022   Procedure: TRANSESOPHAGEAL ECHOCARDIOGRAM (TEE);  Surgeon: Boyce Byes, MD;  Location: [facility];  Service: Cardiovascular;  Laterality: N/A;   VISCERAL ANGIOGRAPHY N/A 07/18/2023   Procedure: MESENTRIC  ANGIOGRAPHY;  Surgeon: Wenona Hamilton, MD;  Location: [facility];  Service: Cardiovascular;  Laterality: N/A;    Prior to Admission medications   Medication Sig Start Date End Date Taking? Authorizing Provider  albuterol  (VENTOLIN  HFA) 108 (90 Base) MCG/ACT inhaler Inhale 2 puffs into the lungs every 6 (six) hours as needed for wheezing or shortness of breath. 10/24/23   Clark, Katherine K, NP  allopurinol  (ZYLOPRIM ) 100 MG tablet Take 100 mg by mouth at bedtime. 12/21/21   [provider]  amLODipine  (NORVASC ) 10 MG tablet Take 1 tablet (10 mg total) by mouth daily. Patient taking differently: Take 5 mg by mouth daily. 01/10/22   Tylene Galla, PA-C  Apremilast  (OTEZLA ) 30 MG TABS Take 1 tablet (30 mg total) by mouth 2 (two) times daily. 09/25/23   Elta Halter, MD  aspirin  EC 81 MG tablet Take 1 tablet (81 mg total) by mouth daily. Swallow whole. 07/05/23   Wenona Hamilton, MD  atorvastatin  (LIPITOR) 20 MG tablet TAKE 1 TABLET BY MOUTH DAILY FOR CHOLESTEROL 11/10/23   Dorothe Gaster, NP  Calcium -Magnesium -Vitamin D (CALCIUM  1200+D3 PO) Take 1 tablet by mouth daily. 10/05/22   [provider]  Cholecalciferol  (DIALYVITE VITAMIN D 5000) 125 MCG (5000 UT) capsule Take 5,000 Units by mouth daily.    [provider]  clopidogrel  (PLAVIX ) 75 MG tablet TAKE 1 TABLET(75 MG) BY MOUTH DAILY WITH BREAKFAST 10/15/23   Wenona Hamilton, MD  cyanocobalamin  1000 MCG tablet Take 1,000 mcg by mouth daily. Vit B12    [provider]  FARXIGA  10 MG TABS tablet Take 10 mg by mouth every morning.  10/05/21   [provider]  furosemide  (LASIX ) 20 MG tablet TAKE 1 TABLET(20 MG) BY MOUTH DAILY AS NEEDED 07/04/23   Boyce Byes, MD  ibandronate  (BONIVA ) 150 MG tablet Take 1 tablet (150 mg total) by mouth every 30 (thirty) days. Take in the morning with a full glass of water, on an empty stomach, and do not take anything else by mouth or lie down for the next 30 min. 10/17/22   Clark, Katherine K, NP  losartan  (COZAAR ) 100 MG tablet TAKE 1 TABLET BY MOUTH DAILY FOR BLOOD PRESSURE 11/10/23   Dorothe Gaster, NP  magnesium  oxide (MAG-OX) 400 (240 Mg) MG tablet TAKE 1 TABLET(400 MG) BY MOUTH DAILY 12/10/23   Boyce Byes, MD  ondansetron  (ZOFRAN -ODT) 4 MG disintegrating tablet Take 1 tablet (4 mg total) by mouth every 8 (eight) hours as needed for nausea or vomiting. 08/08/23   Gabriel John, NP    Family History  Problem Relation Age of Onset   Heart disease Mother  Breast cancer Neg Hx    Colon cancer Neg Hx    Esophageal cancer Neg Hx    Inflammatory bowel disease Neg Hx    Liver disease Neg Hx    Pancreatic cancer Neg Hx    Rectal cancer Neg Hx    Stomach cancer Neg Hx      Social History   Tobacco Use   Smoking status: Former    Current packs/day: 2.00    Average packs/day: 2.0 packs/day for 25.7 years (51.4 ttl pk-yrs)    Types: Cigarettes    Start date: 2001   Smokeless tobacco: Former  Building services engineer status: Never Used  Substance Use Topics   Alcohol use: Never   Drug use: Never    Allergies as of 12/17/2023 - Review Complete 12/17/2023  Allergen Reaction Noted   Alendronate  Palpitations 09/29/2022   Cefaclor Hives and Rash 07/25/2012    Review of Systems:    All systems reviewed and negative except where noted in HPI.   Physical Exam:  BP (!) 169/71   Pulse (!) 57   Temp 98.7 F (37.1 C) (Oral)   Wt 150 lb (68 kg)   BMI 26.57 kg/m  No LMP recorded. Patient is postmenopausal. Psych:  Alert and cooperative. Normal mood and  affect. General:   Alert,  Well-developed, well-nourished, pleasant and cooperative in NAD Head:  Normocephalic and atraumatic. Eyes:  Sclera clear, no icterus.   Conjunctiva pink.   Neurologic:  Alert and oriented x3;  grossly normal neurologically. Psych:  Alert and cooperative. Normal mood and affect.  Imaging Studies: No results found.  Assessment and Plan:   Amy Underwood is a 78 y.o. y/o female has been referred for abdominal discomfort after meals.  She had a history of a Roux-en-Y gastric bypass.  Her predominant symptoms are heartburn.  She has not had a Schatzki's ring dilated for dysphagia which has helped.  Very likely she has longstanding acid reflux.  She has taken Prilosec but has not helped the other differentials for her abdominal discomfort could be mesenteric angina although her symptoms are not classical she does have stenosis of her SMA.   Explained to her that her gastric bypass pouch is small and she cannot tolerate large meals and may need to do small meals more often.   Plan 1.  Alkaline phosphatase is elevated on last set of labs in January 2025 could be related to secondary hyperparathyroidism will obtain repeat LFTs and GGT if GGT is normal then it is not related to the liver also check celiac serology Food allergy  testing  2.  Stop Prilosec, once on Dexilant   3.  Check CBC since she has had occasional black-colored stools.  4.  Refer to Dr Due to evaluate for ?mesenteric angina. Next visit  if not feeling better or other evaluation is negative need to consider ?sphincter of Oddi dysfunction.  Follow up in 8 to 12 weeks  Dr [name] MD,MRCP(U.K)

## 2023-12-17 NOTE — Patient Instructions (Addendum)
 Referral to [facility]- Someone from their office with call to schedule an appointment- If you have not heard from their office within 7-10 days please call and let us  know.  Please stop taking Prilosec and start taking Dexilant.  Please do not forget to go to a [facility] location to have your labs drawn.

## 2023-12-18 ENCOUNTER — Other Ambulatory Visit: Payer: Self-pay

## 2023-12-18 ENCOUNTER — Telehealth: Payer: Self-pay | Admitting: Gastroenterology

## 2023-12-18 MED ORDER — DEXLANSOPRAZOLE 60 MG PO CPDR: 60.0000 mg | DELAYED_RELEASE_CAPSULE | Freq: Every day | ORAL | 11 refills | Status: AC

## 2023-12-18 NOTE — Telephone Encounter (Signed)
 Pt has a medication question requesting call back

## 2023-12-18 NOTE — Telephone Encounter (Signed)
 Called patient back  and she stated that [facility] charged $400 plus. Therefore, she asked for us  to send the prescription to her local pharmacy and hopefully it will be less expensive. I let her know that it's not a guarantee that it will be cheaper but that I will let her pharmacist know to give us  a call back if it's too much for the patient. Then patient would have to call her insurance and ask what other medication she could try. Patient understood.

## 2023-12-18 NOTE — Telephone Encounter (Signed)
 Pt left vm stating that prescription wasn't called in from appointment yesterday she didn't leave the name of the medication

## 2023-12-19 ENCOUNTER — Telehealth: Payer: Self-pay

## 2023-12-19 ENCOUNTER — Other Ambulatory Visit: Payer: Self-pay

## 2023-12-19 DIAGNOSIS — K921 Melena: Secondary | ICD-10-CM

## 2023-12-19 NOTE — Telephone Encounter (Signed)
-----   Message from [name] sent at 12/19/2023  9:25 AM EDT ----- Inform Hb is lower than base line and with histiry of occasional black stool would suggest EGD to be performed to rule out any ulcers. Ensure on ppi and not on any nsaids

## 2023-12-19 NOTE — Telephone Encounter (Signed)
 Called patient to let her know that her hemoglobin had decreased and that Dr. Antony Baumgartner recommended for her to have an EGD since she has had dark stools. Patient agreed and she will have it done on 01/16/2024 at [facility]

## 2023-12-24 LAB — FOOD ALLERGY PROFILE

## 2023-12-24 LAB — CBC WITH DIFFERENTIAL/PLATELET
Basophils Absolute: 0 10*3/uL (ref 0.0–0.2)
Basos: 1 %
EOS (ABSOLUTE): 0.1 10*3/uL (ref 0.0–0.4)
Eos: 2 %
Hematocrit: 30.7 % — ABNORMAL LOW (ref 34.0–46.6)
Hemoglobin: 9.2 g/dL — ABNORMAL LOW (ref 11.1–15.9)
Immature Grans (Abs): 0 10*3/uL (ref 0.0–0.1)
Immature Granulocytes: 0 %
Lymphocytes Absolute: 0.9 10*3/uL (ref 0.7–3.1)
Lymphs: 26 %
MCH: 25.6 pg — ABNORMAL LOW (ref 26.6–33.0)
MCHC: 30 g/dL — ABNORMAL LOW (ref 31.5–35.7)
MCV: 86 fL (ref 79–97)
Monocytes Absolute: 0.3 10*3/uL (ref 0.1–0.9)
Monocytes: 9 %
Neutrophils Absolute: 2.2 10*3/uL (ref 1.4–7.0)
Neutrophils: 62 %
Platelets: 188 10*3/uL (ref 150–450)
RBC: 3.59 x10E6/uL — ABNORMAL LOW (ref 3.77–5.28)
RDW: 13.8 % (ref 11.7–15.4)
WBC: 3.5 10*3/uL (ref 3.4–10.8)

## 2023-12-24 LAB — COMPREHENSIVE METABOLIC PANEL WITH GFR
ALT: 18 IU/L (ref 0–32)
AST: 25 IU/L (ref 0–40)
Albumin: 4.2 g/dL (ref 3.8–4.8)
Alkaline Phosphatase: 104 IU/L (ref 44–121)
BUN/Creatinine Ratio: 28 (ref 12–28)
BUN: 50 mg/dL — ABNORMAL HIGH (ref 8–27)
Bilirubin Total: 0.4 mg/dL (ref 0.0–1.2)
CO2: 22 mmol/L (ref 20–29)
Calcium: 9.2 mg/dL (ref 8.7–10.3)
Chloride: 107 mmol/L — ABNORMAL HIGH (ref 96–106)
Creatinine, Ser: 1.78 mg/dL — ABNORMAL HIGH (ref 0.57–1.00)
Globulin, Total: 1.8 g/dL (ref 1.5–4.5)
Glucose: 113 mg/dL — ABNORMAL HIGH (ref 70–99)
Potassium: 4.4 mmol/L (ref 3.5–5.2)
Sodium: 145 mmol/L — ABNORMAL HIGH (ref 134–144)
Total Protein: 6 g/dL (ref 6.0–8.5)
eGFR: 29 mL/min/{1.73_m2} — ABNORMAL LOW (ref >59)

## 2023-12-24 LAB — CELIAC DISEASE AB SCREEN W/RFX
Antigliadin Abs, IgA: 5 U (ref 0–19)
IgA/Immunoglobulin A, Serum: 68 mg/dL (ref 64–422)
Transglutaminase IgA: <2 U/mL (ref 0–3)

## 2023-12-24 LAB — GAMMA GT: GGT: 14 IU/L (ref 0–60)

## 2023-12-25 ENCOUNTER — Telehealth (HOSPITAL_BASED_OUTPATIENT_CLINIC_OR_DEPARTMENT_OTHER): Payer: Self-pay | Admitting: *Deleted

## 2023-12-25 NOTE — Telephone Encounter (Signed)
   Pre-operative Risk Assessment    Patient Name: Amy Underwood  DOB: December 31, 1945 MRN: [id number]   Date of last office visit: 11/14/2023 Date of next office visit: 02/19/2024  Request for Surgical Clearance    Procedure:   EGD  Date of Surgery:  Clearance 01/16/24                                 Surgeon:  Dr. [name] Surgeon's Group or Practice Name:  [facility] Phone number:  [phone] Fax number:  [phone]   Type of Clearance Requested:   - Medical  - Pharmacy:  Hold Aspirin  and Clopidogrel  (Plavix ) Not Indicated   Type of Anesthesia:  Not Indicated   Additional requests/questions:    Signed, Lauris Port   12/25/2023, 11:56 AM

## 2023-12-28 ENCOUNTER — Encounter (INDEPENDENT_AMBULATORY_CARE_PROVIDER_SITE_OTHER): Payer: Self-pay | Admitting: Vascular Surgery

## 2023-12-28 ENCOUNTER — Ambulatory Visit (INDEPENDENT_AMBULATORY_CARE_PROVIDER_SITE_OTHER): Admitting: Vascular Surgery

## 2023-12-28 VITALS — BP 192/94 | HR 50 | Resp 18 | Ht 63.0 in | Wt 155.0 lb

## 2023-12-28 DIAGNOSIS — I83813 Varicose veins of bilateral lower extremities with pain: Secondary | ICD-10-CM | POA: Diagnosis not present

## 2023-12-28 DIAGNOSIS — K551 Chronic vascular disorders of intestine: Secondary | ICD-10-CM

## 2023-12-28 DIAGNOSIS — I10 Essential (primary) hypertension: Secondary | ICD-10-CM

## 2023-12-28 NOTE — Progress Notes (Signed)
 Patient ID: Amy Underwood, female   DOB: [birth year], 78 y.o.   MRN: [id number]  Chief Complaint  Patient presents with   Follow-up    consult w/JD. Mesenteric angina- per abnormal CT done on 09/21/23  ref: [name] (pt states she will arrive NPO)    HPI Amy Underwood is a 78 y.o. female.  I am asked to see the patient by Dr. Antony Baumgartner for evaluation of chronic mesenteric ischemia as a possible cause of her postprandial abdominal pain and weight loss.  The patient has had disabling abdominal symptoms now for several months.  She reports having lost over 60 pounds from her pain and inability to eat.  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  She has had a fairly extensive workup and is now seeing gastroenterology who made the referral today.  She had an abnormal CT scan of the abdomen and pelvis performed in January of this year which I independently reviewed.  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  This was also not a thin cut CT so it is a little difficult to discern.  Looking back as far as last year, she did have a CT scan with contrast but this was still a 5 mm cut study and does not provide a great amount of information.   She says she is very limited in what she can eat and that almost everything causes pain and nausea.  She seems to be developing food fear as well as the postprandial abdominal pain and weight loss.  She is also now being treated for what sounds like gastritis and reflux.  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 Marven Slimmer   Psoriasis    Unintentional weight loss 04/28/2023    Past Surgical History:  Procedure Laterality Date   ATRIAL FIBRILLATION ABLATION N/A 01/03/2022   Procedure: ATRIAL FIBRILLATION ABLATION;  Surgeon: Boyce Byes, MD;  Location: [facility];  Service: Cardiovascular;  Laterality: N/A;   CARPAL TUNNEL RELEASE Right 01/18/2023   Procedure: CARPAL TUNNEL RELEASE;  Surgeon: Sammye Cristal, MD;  Location: [facility];  Service: Orthopedics;  Laterality: Right;   CESAREAN SECTION     3   CHOLECYSTECTOMY     HIP ARTHROPLASTY Left    KNEE ARTHROPLASTY Bilateral    LEFT ATRIAL APPENDAGE OCCLUSION N/A 04/20/2022   Procedure: LEFT ATRIAL APPENDAGE OCCLUSION;  Surgeon: Boyce Byes, MD;  Location: [facility];  Service: Cardiovascular;  Laterality: N/A;   PERIPHERAL VASCULAR INTERVENTION  07/18/2023   Procedure: PERIPHERAL VASCULAR INTERVENTION;  Surgeon: Wenona Hamilton, MD;  Location: [facility];  Service: Cardiovascular;;   REVERSE SHOULDER ARTHROPLASTY Right 01/18/2023   Procedure: REVERSE SHOULDER ARTHROPLASTY;  Surgeon: Sammye Cristal, MD;  Location: [facility];  Service: Orthopedics;  Laterality: Right;   TEE WITHOUT CARDIOVERSION N/A 04/20/2022   Procedure:  TRANSESOPHAGEAL ECHOCARDIOGRAM (TEE);  Surgeon: Boyce Byes, MD;  Location: [facility];  Service: Cardiovascular;  Laterality: N/A;   VISCERAL ANGIOGRAPHY N/A 07/18/2023   Procedure: MESENTRIC  ANGIOGRAPHY;  Surgeon: Wenona Hamilton, MD;  Location: [facility];  Service: Cardiovascular;  Laterality: N/A;     Family History  Problem Relation Age of Onset   Heart disease Mother    Breast cancer Neg Hx    Colon cancer Neg Hx    Esophageal cancer Neg Hx    Inflammatory bowel disease Neg Hx    Liver disease Neg Hx    Pancreatic cancer  Neg Hx    Rectal cancer Neg Hx    Stomach cancer Neg Hx       Social History   Tobacco Use   Smoking status: Former    Current packs/day: 2.00    Average packs/day: 2.0 packs/day for 25.7 years (51.4 ttl pk-yrs)    Types: Cigarettes    Start date: 2001   Smokeless tobacco: Former  Building services engineer status: Never Used  Substance Use Topics   Alcohol use: Never   Drug use: Never     Allergies  Allergen Reactions   Alendronate  Palpitations   Cefaclor Hives and Rash    Current Outpatient Medications  Medication Sig Dispense Refill   albuterol  (VENTOLIN  HFA) 108 (90 Base) MCG/ACT inhaler Inhale 2 puffs into the lungs every 6 (six) hours as needed for wheezing or shortness of breath. 8 g 0   allopurinol  (ZYLOPRIM ) 100 MG tablet Take 100 mg by mouth at bedtime.     amLODipine  (NORVASC ) 10 MG tablet Take 1 tablet (10 mg total) by mouth daily. (Patient taking differently: Take 5 mg by mouth daily.) 180 tablet 3   Apremilast  (OTEZLA ) 30 MG TABS Take 1 tablet (30 mg total) by mouth 2 (two) times daily. 180 tablet 3   aspirin  EC 81 MG tablet Take 1 tablet (81 mg total) by mouth daily. Swallow whole.     atorvastatin  (LIPITOR) 20 MG tablet TAKE 1 TABLET BY MOUTH DAILY FOR CHOLESTEROL 90 tablet 0   Calcium -Magnesium -Vitamin D (CALCIUM  1200+D3 PO) Take 1 tablet by mouth daily.     Cholecalciferol  (DIALYVITE VITAMIN D 5000) 125 MCG (5000 UT) capsule Take 5,000 Units by mouth daily.     clopidogrel  (PLAVIX ) 75 MG tablet TAKE 1 TABLET(75 MG) BY MOUTH DAILY WITH BREAKFAST 90 tablet 0   cyanocobalamin  1000 MCG tablet Take 1,000 mcg by mouth daily. Vit B12     dexlansoprazole  (DEXILANT ) 60 MG capsule Take 1 capsule (60 mg total) by mouth daily. 30 capsule 11   FARXIGA  10 MG TABS tablet Take 10 mg by mouth every morning.     furosemide  (LASIX ) 20 MG tablet TAKE 1 TABLET(20 MG) BY MOUTH DAILY AS NEEDED 90 tablet 3   ibandronate  (BONIVA ) 150 MG tablet Take 1 tablet (150 mg total) by mouth  every 30 (thirty) days. Take in the morning with a full glass of water, on an empty stomach, and do not take anything else by mouth or lie down for the next 30 min. 3 tablet 3   losartan  (COZAAR ) 100 MG tablet TAKE 1 TABLET BY MOUTH DAILY FOR BLOOD PRESSURE 90 tablet 0   magnesium  oxide (MAG-OX) 400 (240 Mg) MG tablet TAKE 1 TABLET(400 MG) BY MOUTH DAILY 90 tablet 3   ondansetron  (ZOFRAN -ODT) 4 MG disintegrating tablet Take 1 tablet (4 mg total) by mouth every 8 (eight)  hours as needed for nausea or vomiting. 30 tablet 0   No current facility-administered medications for this visit.      REVIEW OF SYSTEMS (Negative unless checked)  Constitutional: [x] Weight loss  [] Fever  [] Chills Cardiac: [] Chest pain   [] Chest pressure   [] Palpitations   [] Shortness of breath when laying flat   [] Shortness of breath at rest   [] Shortness of breath with exertion. Vascular:  [] Pain in legs with walking   [] Pain in legs at rest   [] Pain in legs when laying flat   [] Claudication   [] Pain in feet when walking  [] Pain in feet at rest  [] Pain in feet when laying flat   [] History of DVT   [] Phlebitis   [] Swelling in legs   [] Varicose veins   [] Non-healing ulcers Pulmonary:   [] Uses home oxygen   [] Productive cough   [] Hemoptysis   [] Wheeze  [] COPD   [x] Asthma Neurologic:  [] Dizziness  [] Blackouts   [] Seizures   [] History of stroke   [] History of TIA  [] Aphasia   [] Temporary blindness   [] Dysphagia   [] Weakness or numbness in arms   [] Weakness or numbness in legs Musculoskeletal:  [x] Arthritis   [] Joint swelling   [] Joint pain   [] Low back pain Hematologic:  [] Easy bruising  [] Easy bleeding   [] Hypercoagulable state   [x] Anemic  [] Hepatitis Gastrointestinal:  [] Blood in stool   [] Vomiting blood  [x] Gastroesophageal reflux/heartburn   [x] Abdominal pain Genitourinary:  [] Chronic kidney disease   [] Difficult urination  [] Frequent urination  [] Burning with urination   [] Hematuria Skin:  [] Rashes   [] Ulcers    [] Wounds Psychological:  [] History of anxiety   []  History of major depression.    Physical Exam BP (!) 192/94   Pulse (!) 50   Resp 18   Ht 5\' 3"  (1.6 m)   Wt 155 lb (70.3 kg)   BMI 27.46 kg/m  Gen:  WD/WN, NAD Head: [place]/AT, No temporalis wasting.  Ear/Nose/Throat: Hearing grossly intact, nares w/o erythema or drainage, oropharynx w/o Erythema/Exudate Eyes: Conjunctiva clear, sclera non-icteric  Neck: trachea midline.  No JVD.  Pulmonary:  Good air movement, respirations not labored, no use of accessory muscles  Cardiac: bradycardic Vascular:  Vessel Right Left  Radial Palpable Palpable                                   Gastrointestinal:.  Mildly tender without rebound or guarding Musculoskeletal: M/S 5/5 throughout.  Extremities without ischemic changes.  No deformity or atrophy. No edema. Neurologic: Sensation grossly intact in extremities.  Symmetrical.  Speech is fluent. Motor exam as listed above. Psychiatric: Judgment intact, Mood & affect appropriate for pt's clinical situation. Dermatologic: No rashes or ulcers noted.  No cellulitis or open wounds.    Radiology No results found.  Labs Recent Results (from the past 2160 hours)  ECHOCARDIOGRAM COMPLETE     Status: None   Collection Time: 10/09/23  2:09 PM  Result Value Ref Range   AV Peak grad 14.0 mmHg   Ao pk vel 1.87 m/s   S' Lateral 2.60 cm   Area-P 1/2 3.85 cm2   AV Mean grad 7.0 mmHg   Est EF 55 - 60%   H. pylori breath test     Status: None   Collection Time: 10/10/23  7:43 AM  Result Value Ref Range   H. pylori Breath Test NOT DETECTED NOT DETECTED    Comment: .  Antimicrobials, proton pump inhibitors, and bismuth preparations are known to suppress H. pylori, and  ingestion of these prior to H. pylori diagnostic testing may lead to false negative results. If clinically  indicated, the test may be repeated on a new specimen obtained two weeks after discontinuing treatment. However, a  positive result is still clinically valid.   Celiac Disease Ab Screen w/Rfx     Status: None   Collection Time: 12/18/23 11:55 AM  Result Value Ref Range   Antigliadin Abs, IgA 5 0 - 19 units    Comment:                    Negative                   0 - 19                    Weak Positive             20 - 30                    Moderate to Strong Positive   >30    Transglutaminase IgA <2 0 - 3 U/mL    Comment:                               Negative        0 -  3                               Weak Positive   4 - 10                               Positive           >10  Tissue Transglutaminase (tTG) has been identified  as the endomysial antigen.  Studies have demonstr-  ated that endomysial IgA antibodies have over 99%  specificity for gluten sensitive enteropathy.    IgA/Immunoglobulin A, Serum 68 64 - 422 mg/dL  Food Allergy  Profile     Status: None   Collection Time: 12/18/23 11:55 AM  Result Value Ref Range   Class Description Allergens Comment     Comment:     Levels of Specific IgE       Class  Description of Class     ---------------------------  -----  --------------------                    < 0.10         0         Negative            0.10 -    0.31         0/I       Equivocal/Low            0.32 -    0.55         I         Low            0.56 -    1.40         II        Moderate            1.41 -    3.90  III       High            3.91 -   19.00         IV        Very High           19.01 -  100.00         V         Very High                   >100.00         VI        Very High    Egg White IgE <0.10 Class 0 kU/L   Peanut IgE <0.10 Class 0 kU/L   Soybean IgE <0.10 Class 0 kU/L   Milk IgE <0.10 Class 0 kU/L   Clam IgE <0.10 Class 0 kU/L   Shrimp IgE <0.10 Class 0 kU/L   Walnut IgE <0.10 Class 0 kU/L   Codfish IgE <0.10 Class 0 kU/L   Scallop IgE <0.10 Class 0 kU/L   Wheat IgE <0.10 Class 0 kU/L   Allergen Corn, IgE <0.10 Class 0 kU/L   Sesame Seed IgE <0.10  Class 0 kU/L  CBC with Differential/Platelet     Status: Abnormal   Collection Time: 12/18/23 11:55 AM  Result Value Ref Range   WBC 3.5 3.4 - 10.8 x10E3/uL   RBC 3.59 (L) 3.77 - 5.28 x10E6/uL   Hemoglobin 9.2 (L) 11.1 - 15.9 g/dL   Hematocrit 16.1 (L) 09.6 - 46.6 %   MCV 86 79 - 97 fL   MCH 25.6 (L) 26.6 - 33.0 pg   MCHC 30.0 (L) 31.5 - 35.7 g/dL   RDW 04.5 40.9 - 81.1 %   Platelets 188 150 - 450 x10E3/uL   Neutrophils 62 Not Estab. %   Lymphs 26 Not Estab. %   Monocytes 9 Not Estab. %   Eos 2 Not Estab. %   Basos 1 Not Estab. %   Neutrophils Absolute 2.2 1.4 - 7.0 x10E3/uL   Lymphocytes Absolute 0.9 0.7 - 3.1 x10E3/uL   Monocytes Absolute 0.3 0.1 - 0.9 x10E3/uL   EOS (ABSOLUTE) 0.1 0.0 - 0.4 x10E3/uL   Basophils Absolute 0.0 0.0 - 0.2 x10E3/uL   Immature Granulocytes 0 Not Estab. %   Immature Grans (Abs) 0.0 0.0 - 0.1 x10E3/uL  Comprehensive metabolic panel with GFR     Status: Abnormal   Collection Time: 12/18/23 11:55 AM  Result Value Ref Range   Glucose 113 (H) 70 - 99 mg/dL   BUN 50 (H) 8 - 27 mg/dL   Creatinine, Ser 9.14 (H) 0.57 - 1.00 mg/dL   eGFR 29 (L) >78 GN/FAO/1.30   BUN/Creatinine Ratio 28 12 - 28   Sodium 145 (H) 134 - 144 mmol/L   Potassium 4.4 3.5 - 5.2 mmol/L   Chloride 107 (H) 96 - 106 mmol/L   CO2 22 20 - 29 mmol/L   Calcium  9.2 8.7 - 10.3 mg/dL   Total Protein 6.0 6.0 - 8.5 g/dL   Albumin 4.2 3.8 - 4.8 g/dL   Globulin, Total 1.8 1.5 - 4.5 g/dL   Bilirubin Total 0.4 0.0 - 1.2 mg/dL   Alkaline Phosphatase 104 44 - 121 IU/L   AST 25 0 - 40 IU/L   ALT 18 0 - 32 IU/L  Gamma GT     Status: None   Collection Time: 12/18/23 11:55 AM  Result Value Ref Range  GGT 14 0 - 60 IU/L    Assessment/Plan:  Chronic mesenteric ischemia [facility]) She had an abnormal CT scan of the abdomen and pelvis performed in January of this year which I independently reviewed.  This was an uninfused CT scan, but she does have fairly extensive calcification of the celiac and  superior mesenteric arteries.  This was also not a thin cut CT so it is a little difficult to discern.  Looking back as far as last year, she did have a CT scan with contrast but this was still a 5 mm cut study and does not provide a great amount of information.   Her symptoms are certainly worrisome for chronic visceral ischemia.  I have offered her a mesenteric duplex for further evaluation versus proceeding directly with a mesenteric angiogram.  I have discussed the pathophysiology and natural history of chronic visceral ischemia.  I discussed it is a serious and potentially life-threatening situation.  She voices her understanding.  She would like to have a mesenteric duplex and this will be done in the near future at her convenience.  Will see her back following the study.  Essential hypertension blood pressure control important in reducing the progression of atherosclerotic disease. On appropriate oral medications.   Varicose veins of leg with pain, bilateral Seen for this a couple of years ago.  For now, this is a lesser issue and would take a backseat to her workup of her abdominal problems.      Mikki Alexander 12/28/2023, 11:20 AM   This note was created with Dragon medical transcription system.  Any errors from dictation are unintentional.

## 2023-12-28 NOTE — Assessment & Plan Note (Signed)
 Seen for this a couple of years ago.  For now, this is a lesser issue and would take a backseat to her workup of her abdominal problems.

## 2023-12-28 NOTE — Telephone Encounter (Signed)
 Plavix  can be hold 5 days before procedure but she should continue aspirin  81 mg daily.

## 2023-12-28 NOTE — Assessment & Plan Note (Signed)
 She had an abnormal CT scan of the abdomen and pelvis performed in January of this year which I independently reviewed.  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  This was also not a thin cut CT so it is a little difficult to discern.  Looking back as far as last year, she did have a CT scan with contrast but this was still a 5 mm cut study and does not provide a great amount of information.   Her symptoms are certainly worrisome for chronic visceral ischemia.  I have offered her a mesenteric duplex for further evaluation versus proceeding directly with a mesenteric angiogram.  I have discussed the pathophysiology and natural history of chronic visceral ischemia.  I discussed it is a serious and potentially life-threatening situation.  She voices her understanding.  She would like to have a mesenteric duplex and this will be done in the near future at her convenience.  Will see her back following the study.

## 2023-12-28 NOTE — Assessment & Plan Note (Signed)
 blood pressure control important in reducing the progression of atherosclerotic disease. On appropriate oral medications.

## 2023-12-28 NOTE — Telephone Encounter (Signed)
     Primary Cardiologist: None  Chart reviewed as part of pre-operative protocol coverage. Given past medical history and time since last visit, based on ACC/AHA guidelines, Amy Underwood would be at acceptable risk for the planned procedure without further cardiovascular testing.   Her Plavix  can be held 5 days before procedure but she should continue aspirin  81 mg daily.  Please resume Plavix  as soon as hemostasis is achieved.  I will route this recommendation to the requesting party via Epic fax function and remove from pre-op pool.  Please call with questions.  Chet Cota. Whit Bruni NP-C     12/28/2023, 4:28 PM [facility] [address] Office [phone] Fax [phone]

## 2024-01-01 ENCOUNTER — Ambulatory Visit (INDEPENDENT_AMBULATORY_CARE_PROVIDER_SITE_OTHER): Admitting: Primary Care

## 2024-01-01 ENCOUNTER — Encounter: Payer: Self-pay | Admitting: Primary Care

## 2024-01-01 VITALS — BP 120/80 | HR 51 | Temp 98.0°F | Ht 63.0 in | Wt 151.4 lb

## 2024-01-01 DIAGNOSIS — I10 Essential (primary) hypertension: Secondary | ICD-10-CM | POA: Diagnosis not present

## 2024-01-01 DIAGNOSIS — R112 Nausea with vomiting, unspecified: Secondary | ICD-10-CM

## 2024-01-01 DIAGNOSIS — J4520 Mild intermittent asthma, uncomplicated: Secondary | ICD-10-CM

## 2024-01-01 DIAGNOSIS — I480 Paroxysmal atrial fibrillation: Secondary | ICD-10-CM | POA: Diagnosis not present

## 2024-01-01 DIAGNOSIS — Z0000 Encounter for general adult medical examination without abnormal findings: Secondary | ICD-10-CM | POA: Diagnosis not present

## 2024-01-01 DIAGNOSIS — K551 Chronic vascular disorders of intestine: Secondary | ICD-10-CM | POA: Diagnosis not present

## 2024-01-01 DIAGNOSIS — E782 Mixed hyperlipidemia: Secondary | ICD-10-CM

## 2024-01-01 DIAGNOSIS — M810 Age-related osteoporosis without current pathological fracture: Secondary | ICD-10-CM | POA: Insufficient documentation

## 2024-01-01 DIAGNOSIS — Z23 Encounter for immunization: Secondary | ICD-10-CM | POA: Diagnosis not present

## 2024-01-01 DIAGNOSIS — K219 Gastro-esophageal reflux disease without esophagitis: Secondary | ICD-10-CM

## 2024-01-01 DIAGNOSIS — M816 Localized osteoporosis [Lequesne]: Secondary | ICD-10-CM

## 2024-01-01 DIAGNOSIS — M1A9XX0 Chronic gout, unspecified, without tophus (tophi): Secondary | ICD-10-CM

## 2024-01-01 DIAGNOSIS — N184 Chronic kidney disease, stage 4 (severe): Secondary | ICD-10-CM

## 2024-01-01 DIAGNOSIS — L409 Psoriasis, unspecified: Secondary | ICD-10-CM

## 2024-01-01 NOTE — Patient Instructions (Signed)
 It was a pleasure to see you today!

## 2024-01-01 NOTE — Assessment & Plan Note (Signed)
 Controlled.  Continue losartan  100 mg daily. She's unsure if she takes amlodipine . She will check at home and update. Reviewed CMP from April 2025.

## 2024-01-01 NOTE — Assessment & Plan Note (Signed)
 Following with nephrology. Farxiga  cost prohibitive. She will notify her nephrologist.

## 2024-01-01 NOTE — Assessment & Plan Note (Signed)
 Controlled. No flares.   Continue allopurinol  100 mg daily.

## 2024-01-01 NOTE — Assessment & Plan Note (Signed)
 Prevnar 20 provided today. Bone density scan UTD Mammogram due, she declines now and will consider for next year.  Discussed the importance of a healthy diet and regular exercise in order for weight loss, and to reduce the risk of further co-morbidity.  Exam stable. Labs reviewed.  Follow up in 1 year for repeat physical.

## 2024-01-01 NOTE — Addendum Note (Signed)
 Addended by: Ashlynne Shetterly on: 01/01/2024 10:09 AM   Modules accepted: Orders

## 2024-01-01 NOTE — Progress Notes (Signed)
 Subjective:    Patient ID: Amy Underwood, female    DOB: 1945-10-02, 78 y.o.   MRN: [id number]  HPI  Amy Underwood is a very pleasant 78 y.o. female who presents today for complete physical and follow up of chronic conditions.  Immunizations: -Tetanus: Completed in 2016 -Influenza: Completed last season  -Shingles: Completed Shingrix series -Pneumonia: Completed Prevnar 13 in 2016, Pneumovax 23 in 2018  Diet: Fair diet.  Exercise: No regular exercise.  Eye exam: Completes annually  Dental exam: Completes semi-annually    Mammogram: Completed in January 2024 Bone Density Scan: Completed in 2024  Colonoscopy: Completed in 2019  BP Readings from Last 3 Encounters:  01/01/24 120/80  12/28/23 (!) 192/94  12/17/23 (!) 169/71       Review of Systems  Constitutional:  Negative for unexpected weight change.  HENT:  Negative for rhinorrhea.   Respiratory:  Negative for cough and shortness of breath.   Cardiovascular:  Negative for chest pain.  Gastrointestinal:  Positive for abdominal pain and nausea. Negative for constipation and diarrhea.  Genitourinary:  Negative for difficulty urinating.  Musculoskeletal:  Negative for arthralgias.  Skin:  Negative for rash.  Allergic/Immunologic: Negative for environmental allergies.  Neurological:  Negative for dizziness, numbness and headaches.  Psychiatric/Behavioral:  The patient is not nervous/anxious.          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 Marven Slimmer   Psoriasis    Unintentional weight loss 04/28/2023    Social History   Socioeconomic History   Marital status: Widowed    Spouse name: Not on file   Number of children: 2   Years of education: Not on file   Highest education level: GED or equivalent  Occupational History   Occupation: retired  Tobacco Use   Smoking status: Former    Current packs/day: 2.00    Average packs/day: 2.0 packs/day for 25.7 years (51.4 ttl pk-yrs)    Types: Cigarettes    Start date: 2001   Smokeless tobacco: Former  Building services engineer status: Never Used  Substance and Sexual Activity   Alcohol use: Never   Drug use: Never   Sexual activity: Not Currently  Other Topics Concern   Not on file  Social History Narrative   Not on file   Social Drivers of Health   Financial Resource Strain: Low Risk  (09/02/2023)   Overall Financial Resource Strain (CARDIA)    Difficulty of Paying Living Expenses: Not very hard  Food Insecurity: No Food Insecurity (09/02/2023)   Hunger Vital Sign    Worried About Running Out of Food in the Last Year: Never true    Ran Out of Food in the Last Year: Never true  Transportation Needs: No Transportation Needs (09/02/2023)   PRAPARE - Administrator, Civil Service (Medical): No    Lack of Transportation (Non-Medical): No  Physical Activity: Insufficiently Active (09/02/2023)  Exercise Vital Sign    Days of Exercise per Week: 3 days    Minutes of Exercise per Session: 20 min  Stress: Stress Concern Present (09/02/2023)   [employer] of Occupational Health - Occupational Stress Questionnaire    Feeling of Stress : To some extent  Social Connections: Moderately Isolated (09/02/2023)   Social Connection and Isolation Panel [NHANES]    Frequency of Communication with Friends and Family: More than three times a week    Frequency of Social Gatherings with Friends and Family: Once a week     Attends Religious Services: 1 to 4 times per year    Active Member of [employer] or Organizations: No    Attends Banker Meetings: Never    Marital Status: Widowed  Intimate Partner Violence: Not At Risk (05/02/2023)   Humiliation, Afraid, Rape, and Kick questionnaire    Fear of Current or Ex-Partner: No    Emotionally Abused: No    Physically Abused: No    Sexually Abused: No    Past Surgical History:  Procedure Laterality Date   ATRIAL FIBRILLATION ABLATION N/A 01/03/2022   Procedure: ATRIAL FIBRILLATION ABLATION;  Surgeon: Boyce Byes, MD;  Location: [facility];  Service: Cardiovascular;  Laterality: N/A;   CARPAL TUNNEL RELEASE Right 01/18/2023   Procedure: CARPAL TUNNEL RELEASE;  Surgeon: Sammye Cristal, MD;  Location: [facility];  Service: Orthopedics;  Laterality: Right;   CESAREAN SECTION     3   CHOLECYSTECTOMY     HIP ARTHROPLASTY Left    KNEE ARTHROPLASTY Bilateral    LEFT ATRIAL APPENDAGE OCCLUSION N/A 04/20/2022   Procedure: LEFT ATRIAL APPENDAGE OCCLUSION;  Surgeon: Boyce Byes, MD;  Location: [facility];  Service: Cardiovascular;  Laterality: N/A;   PERIPHERAL VASCULAR INTERVENTION  07/18/2023   Procedure: PERIPHERAL VASCULAR INTERVENTION;  Surgeon: Wenona Hamilton, MD;  Location: [facility];  Service: Cardiovascular;;   REVERSE SHOULDER ARTHROPLASTY Right 01/18/2023   Procedure: REVERSE SHOULDER ARTHROPLASTY;  Surgeon: Sammye Cristal, MD;  Location: [facility];  Service: Orthopedics;  Laterality: Right;   TEE WITHOUT CARDIOVERSION N/A 04/20/2022   Procedure: TRANSESOPHAGEAL ECHOCARDIOGRAM (TEE);  Surgeon: Boyce Byes, MD;  Location: [facility];  Service: Cardiovascular;  Laterality: N/A;   VISCERAL ANGIOGRAPHY N/A 07/18/2023   Procedure: MESENTRIC  ANGIOGRAPHY;  Surgeon: Wenona Hamilton, MD;  Location: [facility];  Service: Cardiovascular;  Laterality: N/A;    Family History  Problem Relation Age of  Onset   Heart disease Mother    Breast cancer Neg Hx    Colon cancer Neg Hx    Esophageal cancer Neg Hx    Inflammatory bowel disease Neg Hx    Liver disease Neg Hx    Pancreatic cancer Neg Hx    Rectal cancer Neg Hx    Stomach cancer Neg Hx     Allergies  Allergen Reactions   Alendronate  Palpitations   Cefaclor Hives and Rash    Current Outpatient Medications on File Prior to Visit  Medication Sig Dispense Refill   albuterol  (VENTOLIN  HFA) 108 (90 Base) MCG/ACT inhaler Inhale 2 puffs into the lungs every 6 (six) hours as needed for wheezing or shortness of breath. 8 g 0   allopurinol  (ZYLOPRIM ) 100 MG tablet Take 100 mg by mouth at bedtime.     amLODipine  (NORVASC ) 10 MG tablet Take 1 tablet (10 mg total) by mouth daily. (Patient taking differently: Take 5 mg by mouth daily.)  180 tablet 3   Apremilast  (OTEZLA ) 30 MG TABS Take 1 tablet (30 mg total) by mouth 2 (two) times daily. 180 tablet 3   aspirin  EC 81 MG tablet Take 1 tablet (81 mg total) by mouth daily. Swallow whole.     atorvastatin  (LIPITOR) 20 MG tablet TAKE 1 TABLET BY MOUTH DAILY FOR CHOLESTEROL 90 tablet 0   Calcium -Magnesium -Vitamin D (CALCIUM  1200+D3 PO) Take 1 tablet by mouth daily.     Cholecalciferol  (DIALYVITE VITAMIN D 5000) 125 MCG (5000 UT) capsule Take 5,000 Units by mouth daily.     clopidogrel  (PLAVIX ) 75 MG tablet TAKE 1 TABLET(75 MG) BY MOUTH DAILY WITH BREAKFAST 90 tablet 0   cyanocobalamin  1000 MCG tablet Take 1,000 mcg by mouth daily. Vit B12     dexlansoprazole  (DEXILANT ) 60 MG capsule Take 1 capsule (60 mg total) by mouth daily. 30 capsule 11   furosemide  (LASIX ) 20 MG tablet TAKE 1 TABLET(20 MG) BY MOUTH DAILY AS NEEDED 90 tablet 3   ibandronate  (BONIVA ) 150 MG tablet Take 1 tablet (150 mg total) by mouth every 30 (thirty) days. Take in the morning with a full glass of water, on an empty stomach, and do not take anything else by mouth or lie down for the next 30 min. 3 tablet 3   losartan  (COZAAR )  100 MG tablet TAKE 1 TABLET BY MOUTH DAILY FOR BLOOD PRESSURE 90 tablet 0   magnesium  oxide (MAG-OX) 400 (240 Mg) MG tablet TAKE 1 TABLET(400 MG) BY MOUTH DAILY 90 tablet 3   ondansetron  (ZOFRAN -ODT) 4 MG disintegrating tablet Take 1 tablet (4 mg total) by mouth every 8 (eight) hours as needed for nausea or vomiting. 30 tablet 0   FARXIGA  10 MG TABS tablet Take 10 mg by mouth every morning. (Patient not taking: Reported on 01/01/2024)     No current facility-administered medications on file prior to visit.    BP 120/80   Pulse (!) 51   Temp 98 F (36.7 C) (Oral)   Ht 5\' 3"  (1.6 m)   Wt 151 lb 6.4 oz (68.7 kg)   SpO2 98%   BMI 26.82 kg/m  Objective:   Physical Exam HENT:     Right Ear: Tympanic membrane and ear canal normal.     Left Ear: Tympanic membrane and ear canal normal.  Eyes:     Pupils: Pupils are equal, round, and reactive to light.  Cardiovascular:     Rate and Rhythm: Normal rate and regular rhythm.  Pulmonary:     Effort: Pulmonary effort is normal.     Breath sounds: Normal breath sounds.  Abdominal:     General: Bowel sounds are normal.     Palpations: Abdomen is soft.     Tenderness: There is no abdominal tenderness.  Musculoskeletal:        General: Normal range of motion.     Cervical back: Neck supple.  Skin:    General: Skin is warm and dry.  Neurological:     Mental Status: She is alert and oriented to person, place, and time.     Cranial Nerves: No cranial nerve deficit.     Deep Tendon Reflexes:     Reflex Scores:      Patellar reflexes are 2+ on the right side and 2+ on the left side. Psychiatric:        Mood and Affect: Mood normal.           Assessment & Plan:  Preventative health care  Assessment & Plan: Prevnar 20 provided today. Bone density scan UTD Mammogram due, she declines now and will consider for next year.  Discussed the importance of a healthy diet and regular exercise in order for weight loss, and to reduce the risk of  further co-morbidity.  Exam stable. Labs reviewed.  Follow up in 1 year for repeat physical.    Chronic mesenteric ischemia [facility]) Assessment & Plan: Following with vascular services, reviewed office notes from April 2025.  Await duplex results.    Essential hypertension Assessment & Plan: Controlled.  Continue losartan  100 mg daily. She's unsure if she takes amlodipine . She will check at home and update. Reviewed CMP from April 2025.   Paroxysmal atrial fibrillation (HCC) Assessment & Plan: Stable. Following with EP and cardiology, office notes reviewed from October 2024.  Continue clopidogrel  75 mg daily.    Gastroesophageal reflux disease without esophagitis Assessment & Plan: Stable. Following with GI.  Continue Dexilant  60 mg daily.    Nausea and vomiting, unspecified vomiting type Assessment & Plan: Secondary to chronic mesenteric ischemia.  Following with vascular services.    Psoriasis Assessment & Plan: Following with dermatology.  Continue Otezla  30 mg BID.   Chronic kidney disease, stage IV (severe) (HCC) Assessment & Plan: Following with nephrology. Farxiga  cost prohibitive. She will notify her nephrologist.    Chronic gout without tophus, unspecified cause, unspecified site Assessment & Plan: Controlled. No flares.   Continue allopurinol  100 mg daily.     Mixed hyperlipidemia Assessment & Plan: Reviewed lipid panel from November 2024. Continue atorvastatin  20 mg daily.   Mild intermittent asthma without complication Assessment & Plan: Controlled.  Continue albuterol  as needed.   Localized osteoporosis without current pathological fracture Assessment & Plan: Bone density scan up-to-date. Continue Boniva  150 mg once monthly.         Kristol Almanzar K Man Effertz, NP

## 2024-01-01 NOTE — Assessment & Plan Note (Signed)
 Following with vascular services, reviewed office notes from April 2025.  Await duplex results.

## 2024-01-01 NOTE — Assessment & Plan Note (Signed)
 Following with dermatology.  Continue Otezla  30 mg BID.

## 2024-01-01 NOTE — Assessment & Plan Note (Signed)
 Stable. Following with GI.  Continue Dexilant  60 mg daily.

## 2024-01-01 NOTE — Assessment & Plan Note (Signed)
 Stable. Following with EP and cardiology, office notes reviewed from October 2024.  Continue clopidogrel  75 mg daily.

## 2024-01-01 NOTE — Assessment & Plan Note (Signed)
Controlled.  Continue albuterol as needed.

## 2024-01-01 NOTE — Assessment & Plan Note (Signed)
 Secondary to chronic mesenteric ischemia.  Following with vascular services.

## 2024-01-01 NOTE — Assessment & Plan Note (Signed)
 Bone density scan up-to-date. Continue Boniva  150 mg once monthly.

## 2024-01-01 NOTE — Assessment & Plan Note (Signed)
 Reviewed lipid panel from November 2024. Continue atorvastatin  20 mg daily.

## 2024-01-04 ENCOUNTER — Ambulatory Visit (INDEPENDENT_AMBULATORY_CARE_PROVIDER_SITE_OTHER)

## 2024-01-04 ENCOUNTER — Ambulatory Visit (INDEPENDENT_AMBULATORY_CARE_PROVIDER_SITE_OTHER): Admitting: Nurse Practitioner

## 2024-01-04 VITALS — BP 172/82 | HR 54 | Resp 16 | Ht 63.0 in | Wt 149.4 lb

## 2024-01-04 DIAGNOSIS — K551 Chronic vascular disorders of intestine: Secondary | ICD-10-CM

## 2024-01-04 DIAGNOSIS — I10 Essential (primary) hypertension: Secondary | ICD-10-CM

## 2024-01-07 ENCOUNTER — Encounter (INDEPENDENT_AMBULATORY_CARE_PROVIDER_SITE_OTHER): Payer: Self-pay | Admitting: Nurse Practitioner

## 2024-01-07 NOTE — Progress Notes (Signed)
 Subjective:    Patient ID: Amy Underwood, female    DOB: [birth year], 78 y.o.   MRN: [id number] Chief Complaint  Patient presents with   Venous Insufficiency    Amy Underwood is a 78 y.o. female.  She returns for evaluation of her chronic mesenteric ischemia.  She continues to have postprandial abdominal pain and weight loss. The patient has had disabling abdominal symptoms now for several months.  She reports having lost over 60 pounds from her pain and inability to eat.  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    She had an abnormal CT scan of the abdomen and pelvis performed in January of this year which I independently reviewed.  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  This was also not a thin cut CT so it is a little difficult to discern.  Looking back as far as last year, she did have a CT scan with contrast but this was still a 5 mm cut study and does not provide a great amount of information.   She says she is very limited in what she can eat and that almost everything causes pain and nausea.  She seems to be developing food fear as well as the postprandial abdominal pain and weight loss.  She is also now being treated for what sounds like gastritis and reflux.  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  The stent is patent but there is a high velocity stenosis greater than 70% just at the stent in proximal to an SMA branch.  There is noted aortic atherosclerosis as well.     Review of Systems  Gastrointestinal:  Positive for nausea.  All other systems reviewed and are negative.      Objective:   Physical Exam Vitals reviewed.  HENT:     Head: Normocephalic.  Cardiovascular:     Rate and Rhythm: Normal rate.     Pulses: Normal pulses.  Pulmonary:     Effort: Pulmonary effort is normal.  Skin:    General: Skin is warm and dry.  Neurological:      Mental Status: She is alert and oriented to person, place, and time.  Psychiatric:        Mood and Affect: Mood normal.        Behavior: Behavior normal.        Thought Content: Thought content normal.        Judgment: Judgment normal.     BP (!) 172/82 (BP Location: Right Arm, Patient Position: Sitting, Cuff Size: Normal)   Pulse (!) 54   Resp 16   Ht 5\' 3"  (1.6 m)   Wt 149 lb 6.4 oz (67.8 kg)   BMI 26.47 kg/m   Past Medical History:  Diagnosis Date   Adhesive capsulitis of right shoulder    Asthma    Benign hypertensive kidney disease with chronic kidney disease 07/11/2021   Bilateral hand numbness 08/08/2022   Chronic nausea 04/28/2023   Hyperlipidemia    Hypertension    Iron deficiency anemia    Leukopenia 09/23/2014   Osteoarthritis    Osteoarthritis of hip 09/23/2014   Pain of upper abdomen 09/21/2023   Paroxysmal atrial fibrillation (HCC)    Pernicious anemia    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   Watchman FLX 31mm with Dr. Marven Slimmer   Psoriasis  Unintentional weight loss 04/28/2023    Social History   Socioeconomic History   Marital status: Widowed    Spouse name: Not on file   Number of children: 2   Years of education: Not on file   Highest education level: GED or equivalent  Occupational History   Occupation: retired  Tobacco Use   Smoking status: Former    Current packs/day: 2.00    Average packs/day: 2.0 packs/day for 25.7 years (51.5 ttl pk-yrs)    Types: Cigarettes    Start date: 2001   Smokeless tobacco: Former  Building services engineer status: Never Used  Substance and Sexual Activity   Alcohol use: Never   Drug use: Never   Sexual activity: Not Currently  Other Topics Concern   Not on file  Social History Narrative   Not on file   Social Drivers of Health   Financial Resource Strain: Low Risk  (09/02/2023)   Overall Financial Resource Strain (CARDIA)    Difficulty of Paying Living Expenses: Not very  hard  Food Insecurity: No Food Insecurity (09/02/2023)   Hunger Vital Sign    Worried About Running Out of Food in the Last Year: Never true    Ran Out of Food in the Last Year: Never true  Transportation Needs: No Transportation Needs (09/02/2023)   PRAPARE - Administrator, Civil Service (Medical): No    Lack of Transportation (Non-Medical): No  Physical Activity: Insufficiently Active (09/02/2023)   Exercise Vital Sign    Days of Exercise per Week: 3 days    Minutes of Exercise per Session: 20 min  Stress: Stress Concern Present (09/02/2023)   [employer] of Occupational Health - Occupational Stress Questionnaire    Feeling of Stress : To some extent  Social Connections: Moderately Isolated (09/02/2023)   Social Connection and Isolation Panel [NHANES]    Frequency of Communication with Friends and Family: More than three times a week    Frequency of Social Gatherings with Friends and Family: Once a week    Attends Religious Services: 1 to 4 times per year    Active Member of [employer] or Organizations: No    Attends Banker Meetings: Never    Marital Status: Widowed  Intimate Partner Violence: Not At Risk (05/02/2023)   Humiliation, Afraid, Rape, and Kick questionnaire    Fear of Current or Ex-Partner: No    Emotionally Abused: No    Physically Abused: No    Sexually Abused: No    Past Surgical History:  Procedure Laterality Date   ATRIAL FIBRILLATION ABLATION N/A 01/03/2022   Procedure: ATRIAL FIBRILLATION ABLATION;  Surgeon: Boyce Byes, MD;  Location: [facility];  Service: Cardiovascular;  Laterality: N/A;   CARPAL TUNNEL RELEASE Right 01/18/2023   Procedure: CARPAL TUNNEL RELEASE;  Surgeon: Sammye Cristal, MD;  Location: [facility];  Service: Orthopedics;  Laterality: Right;   CESAREAN SECTION     3   CHOLECYSTECTOMY     HIP ARTHROPLASTY Left    KNEE ARTHROPLASTY Bilateral    LEFT ATRIAL APPENDAGE OCCLUSION N/A 04/20/2022    Procedure: LEFT ATRIAL APPENDAGE OCCLUSION;  Surgeon: Boyce Byes, MD;  Location: [facility];  Service: Cardiovascular;  Laterality: N/A;   PERIPHERAL VASCULAR INTERVENTION  07/18/2023   Procedure: PERIPHERAL VASCULAR INTERVENTION;  Surgeon: Wenona Hamilton, MD;  Location: [facility];  Service: Cardiovascular;;   REVERSE SHOULDER ARTHROPLASTY Right 01/18/2023   Procedure: REVERSE SHOULDER ARTHROPLASTY;  Surgeon: Sammye Cristal, MD;  Location: [facility];  Service: Orthopedics;  Laterality: Right;   TEE WITHOUT CARDIOVERSION N/A 04/20/2022   Procedure: TRANSESOPHAGEAL ECHOCARDIOGRAM (TEE);  Surgeon: Boyce Byes, MD;  Location: [facility] INVAS[facility] CV [facility];  Service: Cardiovascular;  Laterality: N/A;   VISCERAL ANGIOGRAPHY N/A 07/18/2023   Procedure: MESENTRIC  ANGIOGRAPHY;  Surgeon: Wenona Hamilton, MD;  Location: [facility];  Service: Cardiovascular;  Laterality: N/A;    Family History  Problem Relation Age of Onset   Heart disease Mother    Breast cancer Neg Hx    Colon cancer Neg Hx    Esophageal cancer Neg Hx    Inflammatory bowel disease Neg Hx    Liver disease Neg Hx    Pancreatic cancer Neg Hx    Rectal cancer Neg Hx    Stomach cancer Neg Hx     Allergies  Allergen Reactions   Alendronate  Palpitations   Cefaclor Hives and Rash       Latest Ref Rng & Units 12/18/2023   11:55 AM 09/21/2023    8:20 AM 09/07/2023    3:05 PM  CBC  WBC 3.4 - 10.8 x10E3/uL 3.5  5.9  4.3   Hemoglobin 11.1 - 15.9 g/dL 9.2  40.9  81.1   Hematocrit 34.0 - 46.6 % 30.7  35.1  34.5   Platelets 150 - 450 x10E3/uL 188  216.0  191       CMP     Component Value Date/Time   NA 145 (H) 12/18/2023 1155   K 4.4 12/18/2023 1155   CL 107 (H) 12/18/2023 1155   CO2 22 12/18/2023 1155   GLUCOSE 113 (H) 12/18/2023 1155   GLUCOSE 86 09/21/2023 0820   BUN 50 (H) 12/18/2023 1155   CREATININE 1.78 (H) 12/18/2023 1155   CALCIUM  9.2 12/18/2023 1155   PROT 6.0 12/18/2023 1155    ALBUMIN 4.2 12/18/2023 1155   AST 25 12/18/2023 1155   ALT 18 12/18/2023 1155   ALKPHOS 104 12/18/2023 1155   BILITOT 0.4 12/18/2023 1155   GFR 30.54 (L) 09/21/2023 0820   EGFR 29 (L) 12/18/2023 1155   GFRNONAA 46 (L) 02/07/2023 2324     No results found.     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  This represents a high risk for bowel infarction and death.  Patient should undergo angiography of the mesenteric arteries with the hope for intervention to eliminate the ischemic symptoms.    Patient should undergo intervention after her EGD.  The risks and benefits as well as the alternative therapies was discussed in detail with the patient.  All questions were answered.  Patient agrees to proceed with angiography and intervention.  The patient will follow up with me after the angiogram.  2. Essential hypertension Continue antihypertensive medications as already ordered, these medications have been reviewed and there are no changes at this time.   Current Outpatient Medications on File Prior to Visit  Medication Sig Dispense Refill   albuterol  (VENTOLIN  HFA) 108 (90 Base) MCG/ACT inhaler Inhale 2 puffs into the lungs every 6 (six) hours as needed for wheezing or shortness of breath. 8 g 0   allopurinol  (ZYLOPRIM ) 100 MG tablet Take 100 mg by mouth at bedtime.     amLODipine  (NORVASC ) 10 MG tablet Take 1 tablet (  10 mg total) by mouth daily. (Patient taking differently: Take 5 mg by mouth daily.) 180 tablet 3   Apremilast  (OTEZLA ) 30 MG TABS Take 1 tablet (30 mg total) by mouth 2 (two) times daily. 180 tablet 3   aspirin  EC 81 MG tablet Take 1 tablet (81 mg total) by mouth daily. Swallow whole.     atorvastatin  (LIPITOR) 20 MG tablet TAKE 1 TABLET BY MOUTH DAILY FOR CHOLESTEROL 90 tablet  0   Calcium -Magnesium -Vitamin D (CALCIUM  1200+D3 PO) Take 1 tablet by mouth daily.     Cholecalciferol  (DIALYVITE VITAMIN D 5000) 125 MCG (5000 UT) capsule Take 5,000 Units by mouth daily.     clopidogrel  (PLAVIX ) 75 MG tablet TAKE 1 TABLET(75 MG) BY MOUTH DAILY WITH BREAKFAST 90 tablet 0   cyanocobalamin  1000 MCG tablet Take 1,000 mcg by mouth daily. Vit B12     dexlansoprazole  (DEXILANT ) 60 MG capsule Take 1 capsule (60 mg total) by mouth daily. 30 capsule 11   FARXIGA  10 MG TABS tablet Take 10 mg by mouth every morning.     furosemide  (LASIX ) 20 MG tablet TAKE 1 TABLET(20 MG) BY MOUTH DAILY AS NEEDED 90 tablet 3   ibandronate  (BONIVA ) 150 MG tablet Take 1 tablet (150 mg total) by mouth every 30 (thirty) days. Take in the morning with a full glass of water, on an empty stomach, and do not take anything else by mouth or lie down for the next 30 min. 3 tablet 3   losartan  (COZAAR ) 100 MG tablet TAKE 1 TABLET BY MOUTH DAILY FOR BLOOD PRESSURE 90 tablet 0   magnesium  oxide (MAG-OX) 400 (240 Mg) MG tablet TAKE 1 TABLET(400 MG) BY MOUTH DAILY 90 tablet 3   ondansetron  (ZOFRAN -ODT) 4 MG disintegrating tablet Take 1 tablet (4 mg total) by mouth every 8 (eight) hours as needed for nausea or vomiting. 30 tablet 0   No current facility-administered medications on file prior to visit.    There are no Patient Instructions on file for this visit. No follow-ups on file.   Talayia Hjort E Teva Bronkema, NP

## 2024-01-15 ENCOUNTER — Other Ambulatory Visit: Payer: Self-pay | Admitting: Cardiovascular Disease

## 2024-01-15 ENCOUNTER — Encounter: Payer: Self-pay | Admitting: Gastroenterology

## 2024-01-15 ENCOUNTER — Other Ambulatory Visit: Payer: Self-pay | Admitting: Neurology

## 2024-01-16 ENCOUNTER — Ambulatory Visit
Admission: RE | Admit: 2024-01-16 | Discharge: 2024-01-16 | Disposition: A | Discharge status: Home / Self Care | Attending: Gastroenterology | Admitting: Gastroenterology

## 2024-01-16 ENCOUNTER — Encounter
Admission: RE | Disposition: A | Discharge status: Home / Self Care | Payer: Self-pay | Source: Home / Self Care | Attending: Gastroenterology

## 2024-01-16 ENCOUNTER — Ambulatory Visit: Admitting: Anesthesiology

## 2024-01-16 DIAGNOSIS — Z7984 Long term (current) use of oral hypoglycemic drugs: Secondary | ICD-10-CM | POA: Insufficient documentation

## 2024-01-16 DIAGNOSIS — Z980 Intestinal bypass and anastomosis status: Secondary | ICD-10-CM | POA: Insufficient documentation

## 2024-01-16 DIAGNOSIS — Z87891 Personal history of nicotine dependence: Secondary | ICD-10-CM | POA: Insufficient documentation

## 2024-01-16 DIAGNOSIS — I480 Paroxysmal atrial fibrillation: Secondary | ICD-10-CM | POA: Diagnosis not present

## 2024-01-16 DIAGNOSIS — J45909 Unspecified asthma, uncomplicated: Secondary | ICD-10-CM | POA: Insufficient documentation

## 2024-01-16 DIAGNOSIS — R109 Unspecified abdominal pain: Secondary | ICD-10-CM | POA: Insufficient documentation

## 2024-01-16 DIAGNOSIS — N184 Chronic kidney disease, stage 4 (severe): Secondary | ICD-10-CM | POA: Diagnosis not present

## 2024-01-16 DIAGNOSIS — I129 Hypertensive chronic kidney disease with stage 1 through stage 4 chronic kidney disease, or unspecified chronic kidney disease: Secondary | ICD-10-CM | POA: Diagnosis not present

## 2024-01-16 DIAGNOSIS — K921 Melena: Secondary | ICD-10-CM

## 2024-01-16 DIAGNOSIS — Z7901 Long term (current) use of anticoagulants: Secondary | ICD-10-CM | POA: Insufficient documentation

## 2024-01-16 HISTORY — PX: ESOPHAGOGASTRODUODENOSCOPY: SHX5428

## 2024-01-16 SURGERY — EGD (ESOPHAGOGASTRODUODENOSCOPY)
Anesthesia: General

## 2024-01-16 MED ORDER — PROPOFOL 500 MG/50ML IV EMUL
INTRAVENOUS | Status: DC | PRN
  Administered 2024-01-16: 50 ug/kg/min via INTRAVENOUS

## 2024-01-16 MED ORDER — SODIUM CHLORIDE 0.9 % IV SOLN
INTRAVENOUS | Status: DC
  Administered 2024-01-16: 20 mL/h via INTRAVENOUS

## 2024-01-16 MED ORDER — LIDOCAINE HCL (CARDIAC) PF 100 MG/5ML IV SOSY
PREFILLED_SYRINGE | INTRAVENOUS | Status: DC | PRN
  Administered 2024-01-16: 60 mg via INTRAVENOUS

## 2024-01-16 MED ORDER — DEXMEDETOMIDINE HCL IN NACL 80 MCG/20ML IV SOLN
INTRAVENOUS | Status: DC | PRN
Start: 2024-01-16 — End: 2024-01-16
  Administered 2024-01-16: 20 ug via INTRAVENOUS

## 2024-01-16 MED ORDER — GLYCOPYRROLATE 0.2 MG/ML IJ SOLN
INTRAMUSCULAR | Status: DC | PRN
  Administered 2024-01-16: .2 mg via INTRAVENOUS

## 2024-01-16 MED ORDER — GLYCOPYRROLATE 0.2 MG/ML IJ SOLN
INTRAMUSCULAR | Status: AC
  Filled 2024-01-16: qty 2

## 2024-01-16 MED ORDER — PROPOFOL 10 MG/ML IV BOLUS
INTRAVENOUS | Status: DC | PRN
  Administered 2024-01-16: 30 mg via INTRAVENOUS
  Administered 2024-01-16: 50 mg via INTRAVENOUS

## 2024-01-16 NOTE — Op Note (Signed)
 [facility] Patient Name: Amy Underwood Procedure Date: 01/16/2024 10:42 AM MRN: [id number] Account #: [id number] Date of Birth: 1945/10/06 Admit Type: Outpatient Age: 78 Room: [facility] ENDO ROOM 3 Gender: Female Note Status: Finalized Instrument Name: Upper Endoscope [id number] Procedure:             Upper GI endoscopy Indications:           Abdominal pain Providers:             [name] MD, MD Referring MD:          Gabriel John (Referring MD) Medicines:             Monitored Anesthesia Care Complications:         No immediate complications. Procedure:             Pre-Anesthesia Assessment:                        - Prior to the procedure, a History and Physical was                         performed, and patient medications, allergies and                         sensitivities were reviewed. The patient's tolerance                         of previous anesthesia was reviewed.                        - The risks and benefits of the procedure and the                         sedation options and risks were discussed with the                         patient. All questions were answered and informed                         consent was obtained.                        - ASA Grade Assessment: II - A patient with mild                         systemic disease.                        After obtaining informed consent, the endoscope was                         passed under direct vision. Throughout the procedure,                         the patient's blood pressure, pulse, and oxygen                         saturations were monitored continuously. The Endoscope                         was introduced  through the mouth, and advanced to the                         jejunum. The upper GI endoscopy was accomplished with                         ease. The patient tolerated the procedure well. Findings:      The esophagus was normal.      The examined jejunum was  normal.      The cardia and gastric fundus were normal on retroflexion.      Evidence of a Roux-en-Y gastrojejunostomy was found. The gastrojejunal       anastomosis was characterized by healthy appearing mucosa. This was       traversed. The pouch-to-jejunum limb was characterized by healthy       appearing mucosa. Impression:            - Normal esophagus.                        - Normal examined jejunum.                        - Roux-en-Y gastrojejunostomy with gastrojejunal                         anastomosis characterized by healthy appearing mucosa.                        - No specimens collected. Recommendation:        - Discharge patient to home (with escort).                        - Resume previous diet.                        - Continue present medications.                        - Return to my office as previously scheduled. Procedure Code(s):     --- Professional ---                        [phone], Esophagogastroduodenoscopy, flexible,                         transoral; diagnostic, including collection of                         specimen(s) by brushing or washing, when performed                         (separate procedure) Diagnosis Code(s):     --- Professional ---                        Z98.0, Intestinal bypass and anastomosis status                        R10.9, Unspecified abdominal pain CPT copyright 2022 American Medical Association. All rights reserved. The codes documented in this report are preliminary and upon coder review may  be revised to meet current compliance requirements. Antanasia Kaczynski  Antony Baumgartner, MD [name] MD, MD 01/16/2024 10:59:19 AM This report has been signed electronically. Number of Addenda: 0 Note Initiated On: 01/16/2024 10:42 AM Estimated Blood Loss:  Estimated blood loss: none.      [facility]

## 2024-01-16 NOTE — Transfer of Care (Signed)
 Immediate Anesthesia Transfer of Care Note  Patient: Amy Underwood  Procedure(s) Performed: EGD (ESOPHAGOGASTRODUODENOSCOPY)  Patient Location: PACU  Anesthesia Type:General  Level of Consciousness: sedated  Airway & Oxygen Therapy: Patient Spontanous Breathing  Post-op Assessment: Report given to RN and Post -op Vital signs reviewed and stable  Post vital signs: Reviewed and stable  Last Vitals:  Vitals Value Taken Time  BP 99/45 01/16/24 1107  Temp    Pulse 44 01/16/24 1107  Resp 17 01/16/24 1107  SpO2 97 % 01/16/24 1107  Vitals shown include unfiled device data.  Last Pain:  Vitals:   01/16/24 1104  TempSrc:   PainSc: 0-No pain         Complications: No notable events documented.

## 2024-01-16 NOTE — H&P (Signed)
 [name], MD [address], [address], [place], [place], [address] [address], [place], [place], [address] Phone: [phone]  Fax: [phone]  Primary Care Physician:  Gabriel John, NP   Pre-Procedure History & Physical: HPI:  Amy Underwood is a 78 y.o. 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 Marven Slimmer   Psoriasis    Unintentional weight loss 04/28/2023    Past Surgical History:  Procedure Laterality Date   ATRIAL FIBRILLATION ABLATION N/A 01/03/2022   Procedure: ATRIAL FIBRILLATION ABLATION;  Surgeon: Boyce Byes, MD;  Location: [facility] INVASIVE CV LAB;  Service: Cardiovascular;  Laterality: N/A;   CARPAL TUNNEL RELEASE Right 01/18/2023   Procedure: CARPAL TUNNEL RELEASE;  Surgeon: Sammye Cristal, MD;  Location: [facility] ORS;  Service: Orthopedics;  Laterality: Right;   CESAREAN SECTION     3   CHOLECYSTECTOMY     HIP ARTHROPLASTY Left    KNEE ARTHROPLASTY Bilateral    LEFT ATRIAL APPENDAGE OCCLUSION N/A 04/20/2022   Procedure: LEFT ATRIAL APPENDAGE OCCLUSION;  Surgeon: Boyce Byes, MD;  Location: [facility] INVASIVE CV LAB;  Service: Cardiovascular;  Laterality: N/A;   PERIPHERAL VASCULAR INTERVENTION  07/18/2023   Procedure: PERIPHERAL VASCULAR INTERVENTION;  Surgeon: Wenona Hamilton, MD;  Location: [facility] INVASIVE CV LAB;  Service: Cardiovascular;;   REVERSE SHOULDER ARTHROPLASTY Right 01/18/2023    Procedure: REVERSE SHOULDER ARTHROPLASTY;  Surgeon: Sammye Cristal, MD;  Location: [facility] OR[facility];  Service: Orthopedics;  Laterality: Right;   TEE WITHOUT CARDIOVERSION N/A 04/20/2022   Procedure: TRANSESOPHAGEAL ECHOCARDIOGRAM (TEE);  Surgeon: Boyce Byes, MD;  Location: [facility] INVASIVE CV LAB;  Service: Cardiovascular;  Laterality: N/A;   VISCERAL ANGIOGRAPHY N/A 07/18/2023   Procedure: MESENTRIC  ANGIOGRAPHY;  Surgeon: Wenona Hamilton, MD;  Location: [facility] INVASIVE CV LAB;  Service: Cardiovascular;  Laterality: N/A;    Prior to Admission medications   Medication Sig Start Date End Date Taking? Authorizing Provider  albuterol  (VENTOLIN  HFA) 108 (90 Base) MCG/ACT inhaler Inhale 2 puffs into the lungs every 6 (six) hours as needed for wheezing or shortness of breath. 10/24/23   Clark, Katherine K, NP  allopurinol  (ZYLOPRIM ) 100 MG tablet Take 100 mg by mouth at bedtime. 12/21/21   [provider]  amLODipine  (NORVASC ) 10 MG tablet Take 1 tablet (10 mg total) by mouth daily. Patient taking differently: Take 5 mg by mouth daily. 01/10/22   Tylene Galla, PA-C  Apremilast  (OTEZLA ) 30 MG TABS Take 1 tablet (30 mg total) by mouth 2 (two) times daily. 09/25/23   Elta Halter, MD  aspirin  EC 81 MG tablet Take 1 tablet (81 mg total) by mouth daily. Swallow whole. 07/05/23   Wenona Hamilton, MD  atorvastatin  (LIPITOR) 20 MG tablet TAKE 1 TABLET BY MOUTH DAILY FOR CHOLESTEROL 11/10/23   Dorothe Gaster, NP  Calcium -Magnesium -Vitamin D (CALCIUM  1200+D3 PO)  Take 1 tablet by mouth daily. 10/05/22   [provider]  Cholecalciferol  (DIALYVITE VITAMIN D 5000) 125 MCG (5000 UT) capsule Take 5,000 Units by mouth daily.    [provider]  clopidogrel  (PLAVIX ) 75 MG tablet TAKE 1 TABLET(75 MG) BY MOUTH DAILY WITH BREAKFAST 01/15/24   Wenona Hamilton, MD  cyanocobalamin  1000 MCG tablet Take 1,000 mcg by mouth daily. Vit B12    [provider]  dexlansoprazole  (DEXILANT ) 60 MG  capsule Take 1 capsule (60 mg total) by mouth daily. 12/18/23   [name], MD  FARXIGA  10 MG TABS tablet Take 10 mg by mouth every morning. 10/05/21   [provider]  furosemide  (LASIX ) 20 MG tablet TAKE 1 TABLET(20 MG) BY MOUTH DAILY AS NEEDED 07/04/23   Boyce Byes, MD  ibandronate  (BONIVA ) 150 MG tablet Take 1 tablet (150 mg total) by mouth every 30 (thirty) days. Take in the morning with a full glass of water, on an empty stomach, and do not take anything else by mouth or lie down for the next 30 min. 10/17/22   Clark, Katherine K, NP  losartan  (COZAAR ) 100 MG tablet TAKE 1 TABLET BY MOUTH DAILY FOR BLOOD PRESSURE 11/10/23   Dorothe Gaster, NP  magnesium  oxide (MAG-OX) 400 (240 Mg) MG tablet TAKE 1 TABLET(400 MG) BY MOUTH DAILY 12/10/23   Boyce Byes, MD  ondansetron  (ZOFRAN -ODT) 4 MG disintegrating tablet Take 1 tablet (4 mg total) by mouth every 8 (eight) hours as needed for nausea or vomiting. 08/08/23   Gabriel John, NP    Allergies as of 12/19/2023 - Review Complete 12/17/2023  Allergen Reaction Noted   Alendronate  Palpitations 09/29/2022   Cefaclor Hives and Rash 07/25/2012    Family History  Problem Relation Age of Onset   Heart disease Mother    Breast cancer Neg Hx    Colon cancer Neg Hx    Esophageal cancer Neg Hx    Inflammatory bowel disease Neg Hx    Liver disease Neg Hx    Pancreatic cancer Neg Hx    Rectal cancer Neg Hx    Stomach cancer Neg Hx     Social History   Socioeconomic History   Marital status: Widowed    Spouse name: Not on file   Number of children: 2   Years of education: Not on file   Highest education level: GED or equivalent  Occupational History   Occupation: retired  Tobacco Use   Smoking status: Former    Current packs/day: 2.00    Average packs/day: 2.0 packs/day for 25.8 years (51.5 ttl pk-yrs)    Types: Cigarettes    Start date: 2001   Smokeless tobacco: Former  Building services engineer status: Never Used   Substance and Sexual Activity   Alcohol use: Never   Drug use: Never   Sexual activity: Not Currently  Other Topics Concern   Not on file  Social History Narrative   Not on file   Social Drivers of Health   Financial Resource Strain: Low Risk  (09/02/2023)   Overall Financial Resource Strain (CARDIA)    Difficulty of Paying Living Expenses: Not very hard  Food Insecurity: No Food Insecurity (09/02/2023)   Hunger Vital Sign    Worried About Running Out of Food in the Last Year: Never true    Ran Out of Food in the Last Year: Never true  Transportation Needs: No Transportation Needs (09/02/2023)   PRAPARE - Transportation  Lack of Transportation (Medical): No    Lack of Transportation (Non-Medical): No  Physical Activity: Insufficiently Active (09/02/2023)   Exercise Vital Sign    Days of Exercise per Week: 3 days    Minutes of Exercise per Session: 20 min  Stress: Stress Concern Present (09/02/2023)   [employer] of Occupational Health - Occupational Stress Questionnaire    Feeling of Stress : To some extent  Social Connections: Moderately Isolated (09/02/2023)   Social Connection and Isolation Panel [NHANES]    Frequency of Communication with Friends and Family: More than three times a week    Frequency of Social Gatherings with Friends and Family: Once a week    Attends Religious Services: 1 to 4 times per year    Active Member of [employer] or Organizations: No    Attends Banker Meetings: Never    Marital Status: Widowed  Intimate Partner Violence: Not At Risk (05/02/2023)   Humiliation, Afraid, Rape, and Kick questionnaire    Fear of Current or Ex-Partner: No    Emotionally Abused: No    Physically Abused: No    Sexually Abused: No    Review of Systems: See HPI, otherwise negative ROS  Physical Exam: There were no vitals taken for this visit. General:   Alert,  pleasant and cooperative in NAD Head:  Normocephalic and atraumatic. Neck:  Supple;  no masses or thyromegaly. Lungs:  Clear throughout to auscultation, normal respiratory effort.    Heart:  +S1, +S2, Regular rate and rhythm, No edema. Abdomen:  Soft, nontender and nondistended. Normal bowel sounds, without guarding, and without rebound.   Neurologic:  Alert and  oriented x4;  grossly normal neurologically.  Impression/Plan: Amy Underwood is here for an endoscopy  to be performed for  evaluation of melena.     Risks, benefits, limitations, and alternatives regarding endoscopy have been reviewed with the patient.  Questions have been answered.  All parties agreeable.   [name], MD  01/16/2024, 9:45 AM

## 2024-01-16 NOTE — Anesthesia Postprocedure Evaluation (Signed)
 Anesthesia Post Note  Patient: Pearson Morici  Procedure(s) Performed: EGD (ESOPHAGOGASTRODUODENOSCOPY)  Patient location during evaluation: PACU Anesthesia Type: General Level of consciousness: awake and alert, oriented and patient cooperative Pain management: pain level controlled Vital Signs Assessment: post-procedure vital signs reviewed and stable Respiratory status: spontaneous breathing, nonlabored ventilation and respiratory function stable Cardiovascular status: blood pressure returned to baseline and stable Postop Assessment: adequate PO intake Anesthetic complications: no   No notable events documented.   Last Vitals:  Vitals:   01/16/24 1113 01/16/24 1123  BP: (!) 106/47   Pulse:    Resp:    Temp:    SpO2:  99%    Last Pain:  Vitals:   01/16/24 1123  TempSrc:   PainSc: 0-No pain                 Dorothey Gate

## 2024-01-16 NOTE — Anesthesia Preprocedure Evaluation (Addendum)
 Anesthesia Evaluation  Patient identified by MRN, date of birth, ID band Patient awake    Reviewed: Allergy  & Precautions, NPO status , Patient's Chart, lab work & pertinent test results  History of Anesthesia Complications Negative for: history of anesthetic complications  Airway Mallampati: I   Neck ROM: Full    Dental  (+) Missing   Pulmonary asthma , former smoker   Pulmonary exam normal breath sounds clear to auscultation       Cardiovascular hypertension, + Peripheral Vascular Disease (SMA stenosis)  Normal cardiovascular exam+ dysrhythmias (a fib s/p Watchman on Plavix )  Rhythm:Regular Rate:Normal  ECG 11/14/23: Junctional rhythm  Echo 09/06/20:  1. Normal left ventricular cavity size. Normal left ventricular systolic function. LV Ejection Fraction is approximately: 67 %.  2. Indeterminate diastolic dysfunction based on 6045 ASE guidelines.  3. There are no regional wall motion abnormalities.  4. Mild mitral valve regurgitation.  5. Mild aortic valve stenosis. The peak transaortic gradient is: 26.66 mmHg. The mean transaortic gradient is: 11.61 mmHg. The aortic valve area by the continuity equation (using VTI) is: 1.88 cm2.  6. The aortic root is normal in size. The ascending aorta is normal in size.     Neuro/Psych negative neurological ROS     GI/Hepatic ,GERD  ,,  Endo/Other  Obesity  Renal/GU Renal disease (stage IV CKD)     Musculoskeletal  (+) Arthritis ,    Abdominal   Peds  Hematology  (+) Blood dyscrasia, anemia   Anesthesia Other Findings Cardiology note 12/25/23:  Chart reviewed as part of pre-operative protocol coverage. Given past medical history and time since last visit, based on ACC/AHA guidelines, Amy Underwood would be at acceptable risk for the planned procedure without further cardiovascular testing.    Her Plavix  can be held 5 days before procedure but she should continue aspirin  81 mg  daily.  Please resume Plavix  as soon as hemostasis is achieved.   I will route this recommendation to the requesting party via Epic fax function and remove from pre-op pool.   Please call with questions.   Chet Cota. Cleaver NP-C    Cardiology note 11/14/23:  #Persistent atrial fibrillation #High risk drug monitoring-amiodarone  #Watchman in situ   Stop amiodarone  today to see if her sinus rate picks up. Continue aspirin  and Plavix    Follow-up with EP APP in 3 months. Needs ECG at that visit   Reproductive/Obstetrics                             Anesthesia Physical Anesthesia Plan  ASA: 4  Anesthesia Plan: General   Post-op Pain Management:    Induction: Intravenous  PONV Risk Score and Plan: 3 and Propofol  infusion, TIVA and Treatment may vary due to age or medical condition  Airway Management Planned: Natural Airway  Additional Equipment:   Intra-op Plan:   Post-operative Plan:   Informed Consent: I have reviewed the patients History and Physical, chart, labs and discussed the procedure including the risks, benefits and alternatives for the proposed anesthesia with the patient or authorized representative who has indicated his/her understanding and acceptance.       Plan Discussed with: CRNA  Anesthesia Plan Comments: (LMA/GETA backup discussed.  Patient consented for risks of anesthesia including but not limited to:  - adverse reactions to medications - damage to eyes, teeth, lips or other oral mucosa - nerve damage due to positioning  - sore throat or hoarseness -  damage to heart, brain, nerves, lungs, other parts of body or loss of life  Informed patient about role of CRNA in peri- and intra-operative care.  Patient voiced understanding.)        Anesthesia Quick Evaluation

## 2024-01-17 ENCOUNTER — Telehealth (INDEPENDENT_AMBULATORY_CARE_PROVIDER_SITE_OTHER): Payer: Self-pay

## 2024-01-17 ENCOUNTER — Encounter: Payer: Self-pay | Admitting: Gastroenterology

## 2024-01-17 NOTE — Telephone Encounter (Signed)
 I contacted the patient to schedule her for a mesenteric angio with Dr. Vonna Guardian. Patient was offered dates after 02/03/24 and was unable to decide on a date. Patient stated she will call when she is ready to be scheduled.

## 2024-01-22 ENCOUNTER — Encounter (INDEPENDENT_AMBULATORY_CARE_PROVIDER_SITE_OTHER): Payer: Self-pay

## 2024-02-01 ENCOUNTER — Ambulatory Visit: Admitting: Primary Care

## 2024-02-01 ENCOUNTER — Emergency Department

## 2024-02-01 ENCOUNTER — Emergency Department
Admission: EM | Admit: 2024-02-01 | Discharge: 2024-02-01 | Disposition: A | Discharge status: Home / Self Care | Attending: Emergency Medicine | Admitting: Emergency Medicine

## 2024-02-01 ENCOUNTER — Other Ambulatory Visit: Payer: Self-pay

## 2024-02-01 DIAGNOSIS — N189 Chronic kidney disease, unspecified: Secondary | ICD-10-CM | POA: Insufficient documentation

## 2024-02-01 DIAGNOSIS — R0789 Other chest pain: Secondary | ICD-10-CM | POA: Diagnosis present

## 2024-02-01 DIAGNOSIS — J690 Pneumonitis due to inhalation of food and vomit: Secondary | ICD-10-CM | POA: Insufficient documentation

## 2024-02-01 LAB — CBC
HCT: 32.2 % — ABNORMAL LOW (ref 36.0–46.0)
Hemoglobin: 9.4 g/dL — ABNORMAL LOW (ref 12.0–15.0)
MCH: 24.9 pg — ABNORMAL LOW (ref 26.0–34.0)
MCHC: 29.2 g/dL — ABNORMAL LOW (ref 30.0–36.0)
MCV: 85.2 fL (ref 80.0–100.0)
Platelets: 164 10*3/uL (ref 150–400)
RBC: 3.78 MIL/uL — ABNORMAL LOW (ref 3.87–5.11)
RDW: 16.4 % — ABNORMAL HIGH (ref 11.5–15.5)
WBC: 6.4 10*3/uL (ref 4.0–10.5)
nRBC: 0 % (ref 0.0–0.2)

## 2024-02-01 LAB — BASIC METABOLIC PANEL WITH GFR
Anion gap: 11 (ref 5–15)
BUN: 37 mg/dL — ABNORMAL HIGH (ref 8–23)
CO2: 19 mmol/L — ABNORMAL LOW (ref 22–32)
Calcium: 8.5 mg/dL — ABNORMAL LOW (ref 8.9–10.3)
Chloride: 107 mmol/L (ref 98–111)
Creatinine, Ser: 1.72 mg/dL — ABNORMAL HIGH (ref 0.44–1.00)
GFR, Estimated: 30 mL/min — ABNORMAL LOW (ref >60)
Glucose, Bld: 227 mg/dL — ABNORMAL HIGH (ref 70–99)
Potassium: 5 mmol/L (ref 3.5–5.1)
Sodium: 137 mmol/L (ref 135–145)

## 2024-02-01 LAB — TROPONIN I (HIGH SENSITIVITY)
Troponin I (High Sensitivity): 18 ng/L — ABNORMAL HIGH (ref <18)
Troponin I (High Sensitivity): 19 ng/L — ABNORMAL HIGH (ref <18)

## 2024-02-01 MED ORDER — IPRATROPIUM-ALBUTEROL 0.5-2.5 (3) MG/3ML IN SOLN
3.0000 mL | Freq: Once | RESPIRATORY_TRACT | Status: AC
  Administered 2024-02-01: 3 mL via RESPIRATORY_TRACT
  Filled 2024-02-01: qty 3

## 2024-02-01 MED ORDER — AMOXICILLIN-POT CLAVULANATE 875-125 MG PO TABS: 1.0000 | ORAL_TABLET | Freq: Two times a day (BID) | ORAL | 0 refills | Status: AC

## 2024-02-01 MED ORDER — AMOXICILLIN-POT CLAVULANATE 875-125 MG PO TABS
1.0000 | ORAL_TABLET | Freq: Once | ORAL | Status: AC
  Administered 2024-02-01: 1 via ORAL
  Filled 2024-02-01: qty 1

## 2024-02-01 NOTE — ED Provider Notes (Signed)
 [facility] Provider Note    Event Date/Time   First MD Initiated Contact with Patient 02/01/24 1525     (approximate)   History   Chest Pain and Hemoptysis   HPI  Amy Underwood is a 78 y.o. female with distant history of asthma, chronic kidney disease, paroxysmal atrial fibrillation who presents with complaints of cough, chest tightness, possible aspiration.  Patient reports yesterday at dinner she was taking a bite, felt something go down the wrong "tube ".  Coughed vociferously thereafter but developed mild chest discomfort which has been continuous.  She has had multiple coughing episodes over night.  Prior to this dinner she had been feeling well.  No fevers.     Physical Exam   Triage Vital Signs: ED Triage Vitals  Encounter Vitals Group     BP 02/01/24 1158 (!) 158/57     Systolic BP Percentile --      Diastolic BP Percentile --      Pulse Rate 02/01/24 1157 61     Resp 02/01/24 1157 18     Temp 02/01/24 1157 98.5 F (36.9 C)     Temp Source 02/01/24 1157 Oral     SpO2 02/01/24 1157 98 %     Weight --      Height --      Head Circumference --      Peak Flow --      Pain Score 02/01/24 1158 7     Pain Loc --      Pain Education --      Exclude from Growth Chart --     Most recent vital signs: Vitals:   02/01/24 1158 02/01/24 1530  BP: (!) 158/57 (!) 175/85  Pulse:  (!) 51  Resp:    Temp:    SpO2:  100%     General: Awake, no distress.  CV:  Good peripheral perfusion.  Regular rate and rhythm Resp:  Normal effort.  Scattered mild wheezing Abd:  No distention.  Other:     ED Results / Procedures / Treatments   Labs (all labs ordered are listed, but only abnormal results are displayed) Labs Reviewed  BASIC METABOLIC PANEL WITH GFR - Abnormal; Notable for the following components:      Result Value   CO2 19 (*)    Glucose, Bld 227 (*)    BUN 37 (*)    Creatinine, Ser 1.72 (*)    Calcium  8.5 (*)    GFR, Estimated 30  (*)    All other components within normal limits  CBC - Abnormal; Notable for the following components:   RBC 3.78 (*)    Hemoglobin 9.4 (*)    HCT 32.2 (*)    MCH 24.9 (*)    MCHC 29.2 (*)    RDW 16.4 (*)    All other components within normal limits  TROPONIN I (HIGH SENSITIVITY) - Abnormal; Notable for the following components:   Troponin I (High Sensitivity) 18 (*)    All other components within normal limits  TROPONIN I (HIGH SENSITIVITY) - Abnormal; Notable for the following components:   Troponin I (High Sensitivity) 19 (*)    All other components within normal limits     EKG  ED ECG REPORT I, Bryson Carbine, the attending physician, personally viewed and interpreted this ECG.  Date: 02/01/2024  Rhythm:  QRS Axis: normal Intervals: normal ST/T Wave abnormalities: normal Narrative Interpretation: no evidence of acute ischemia    RADIOLOGY  Chest x-ray without acute abnormality, viewed interpret by me    PROCEDURES:  Critical Care performed:   Procedures   MEDICATIONS ORDERED IN ED: Medications  amoxicillin-clavulanate (AUGMENTIN) 875-125 MG per tablet 1 tablet (has no administration in time range)  ipratropium-albuterol  (DUONEB) 0.5-2.5 (3) MG/3ML nebulizer solution 3 mL (3 mLs Nebulization Given 02/01/24 1543)  ipratropium-albuterol  (DUONEB) 0.5-2.5 (3) MG/3ML nebulizer solution 3 mL (3 mLs Nebulization Given 02/01/24 1543)     IMPRESSION / MDM / ASSESSMENT AND PLAN / ED COURSE  I reviewed the triage vital signs and the nursing notes. Patient's presentation is most consistent with acute presentation with potential threat to life or bodily function.   Patient presents with chest tightness, cough, possible aspiration as above.  Differential includes aspiration, bronchospasm, pneumonia, ACS  Initial lab work overall reassuring, chest x-ray without evidence of pneumonia.  She is feeling somewhat improved today.  Will treat with DuoNebs x 2, high sensitive  troponin is mildly elevated but this is typical for her per review of records.  EKG is reassuring.  Pending CT chest without contrast.  CT scan demonstrates likely pneumonitis/aspiration.  Discussed this with patient and recommended admission however the patient would strongly like to be discharged, she does not want to stay in the hospital.  Given reassuring oxygenation, heart rate, vitals and her agreement to return if any worsening will treat with p.o. antibiotics     FINAL CLINICAL IMPRESSION(S) / ED DIAGNOSES   Final diagnoses:  Aspiration pneumonitis (HCC)     Rx / DC Orders   ED Discharge Orders          Ordered    amoxicillin-clavulanate (AUGMENTIN) 875-125 MG tablet  2 times daily        02/01/24 1828             Note:  This document was prepared using Dragon voice recognition software and may include unintentional dictation errors.   Bryson Carbine, MD 02/01/24 [phone]

## 2024-02-01 NOTE — ED Triage Notes (Signed)
 Pt to ED via POV from home. Pt reports yesterday she was eating dinner and felt like something went down wrong. Pt reports then went to lay down and started having chest tightness like someone was sitting on it. Pt reports she has been coughing all night. Pt reports started having pink tinge to phlegm. Pt also endorses SOB. Pt with hx of afib and is on plavix .

## 2024-02-03 ENCOUNTER — Other Ambulatory Visit: Payer: Self-pay | Admitting: Nurse Practitioner

## 2024-02-03 DIAGNOSIS — E782 Mixed hyperlipidemia: Secondary | ICD-10-CM

## 2024-02-03 DIAGNOSIS — I10 Essential (primary) hypertension: Secondary | ICD-10-CM

## 2024-02-05 ENCOUNTER — Ambulatory Visit (INDEPENDENT_AMBULATORY_CARE_PROVIDER_SITE_OTHER): Admitting: Primary Care

## 2024-02-05 ENCOUNTER — Encounter: Payer: Self-pay | Admitting: Primary Care

## 2024-02-05 VITALS — BP 146/82 | HR 63 | Temp 98.2°F | Ht 63.0 in | Wt 144.0 lb

## 2024-02-05 DIAGNOSIS — F4323 Adjustment disorder with mixed anxiety and depressed mood: Secondary | ICD-10-CM | POA: Diagnosis not present

## 2024-02-05 DIAGNOSIS — J690 Pneumonitis due to inhalation of food and vomit: Secondary | ICD-10-CM | POA: Diagnosis not present

## 2024-02-05 MED ORDER — SERTRALINE HCL 25 MG PO TABS: 25.0000 mg | ORAL_TABLET | Freq: Every day | ORAL | 0 refills | Status: DC

## 2024-02-05 NOTE — Progress Notes (Signed)
 Subjective:    Patient ID: Amy Underwood, female    DOB: 12/02/1945, 78 y.o.   MRN: [id number]  HPI  Amy Underwood is a very pleasant 78 y.o. female with a history of hypertension, paroxysmal atrial fibrillation, superior mesenteric artery stenosis, chronic mesenteric ischemia, GERD, dysphagia, psoriasis, osteoporosis, CKD, anemia, gout who presents today for ED follow-up.  She presented to [facility] ED on 02/01/2024 for cough and chest tightness that began the evening prior when choking on food at dinner time.  During her stay in the ED she underwent chest x-ray which was negative for evidence of pneumonia.  She was treated with DuoNebs x 2 with improvement.  High sensitive troponin was mildly elevated which was not different from baseline.  EKG was without acute findings.  She underwent CT chest which demonstrated potential pneumonitis/aspiration. She was discharged home later that day with a prescription for Augmentin .   Since her ED visit she's compliant to her Augmentin . She continues to feel chest tightness and a cough but this has improved. She underwent upper endoscopy on 01/16/24 which was normal. She's not eating much because she's afraid she will experience nausea and vomiting.  She's felt more depressed over the last year, worse over the last few weeks. She's tired of procedures and doctors appointments, especially since nothing has been found for her symptoms. Symptoms include feeling down, little motivation to do things, worrying a lot, sleep disturbance. She is requesting treatment for anxiety/depression.    BP Readings from Last 3 Encounters:  02/05/24 (!) 146/82  02/01/24 (!) 152/97  01/16/24 (!) 106/47      Review of Systems  Respiratory:  Positive for cough and chest tightness.   Gastrointestinal:  Negative for abdominal pain, nausea and vomiting.         Past Medical History:  Diagnosis Date   Adhesive capsulitis of right shoulder    Asthma    Benign hypertensive  kidney disease with chronic kidney disease 07/11/2021   Bilateral hand numbness 08/08/2022   Chronic nausea 04/28/2023   Hyperlipidemia    Hypertension    Iron deficiency anemia    Leukopenia 09/23/2014   Osteoarthritis    Osteoarthritis of hip 09/23/2014   Pain of upper abdomen 09/21/2023   Paroxysmal atrial fibrillation (HCC)    Pernicious anemia    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   Watchman FLX 31mm with Dr. Marven Slimmer   Psoriasis    Unintentional weight loss 04/28/2023    Social History   Socioeconomic History   Marital status: Widowed    Spouse name: Not on file   Number of children: 2   Years of education: Not on file   Highest education level: GED or equivalent  Occupational History   Occupation: retired  Tobacco Use   Smoking status: Former    Current packs/day: 2.00    Average packs/day: 2.0 packs/day for 25.8 years (51.6 ttl pk-yrs)    Types: Cigarettes    Start date: 2001   Smokeless tobacco: Former  Building services engineer status: Never Used  Substance and Sexual Activity   Alcohol use: Never   Drug use: Never   Sexual activity: Not Currently  Other Topics Concern   Not on file  Social History Narrative   Not on file   Social Drivers of Health   Financial Resource Strain: Low Risk  (09/02/2023)   Overall Financial Resource Strain (CARDIA)    Difficulty of Paying Living Expenses: Not very hard  Food Insecurity: No Food Insecurity (09/02/2023)   Hunger Vital Sign    Worried About Running Out of Food in the Last Year: Never true    Ran Out of Food in the Last Year: Never true  Transportation Needs: No Transportation Needs (09/02/2023)   PRAPARE - Administrator, Civil Service (Medical): No    Lack of Transportation (Non-Medical): No  Physical Activity: Insufficiently Active (09/02/2023)   Exercise Vital Sign    Days of Exercise per Week: 3 days    Minutes of Exercise per Session: 20 min  Stress: Stress  Concern Present (09/02/2023)   [employer] of Occupational Health - Occupational Stress Questionnaire    Feeling of Stress : To some extent  Social Connections: Moderately Isolated (09/02/2023)   Social Connection and Isolation Panel [NHANES]    Frequency of Communication with Friends and Family: More than three times a week    Frequency of Social Gatherings with Friends and Family: Once a week    Attends Religious Services: 1 to 4 times per year    Active Member of [employer] or Organizations: No    Attends Banker Meetings: Never    Marital Status: Widowed  Intimate Partner Violence: Not At Risk (05/02/2023)   Humiliation, Afraid, Rape, and Kick questionnaire    Fear of Current or Ex-Partner: No    Emotionally Abused: No    Physically Abused: No    Sexually Abused: No    Past Surgical History:  Procedure Laterality Date   ATRIAL FIBRILLATION ABLATION N/A 01/03/2022   Procedure: ATRIAL FIBRILLATION ABLATION;  Surgeon: Boyce Byes, MD;  Location: [facility];  Service: Cardiovascular;  Laterality: N/A;   CARPAL TUNNEL RELEASE Right 01/18/2023   Procedure: CARPAL TUNNEL RELEASE;  Surgeon: Sammye Cristal, MD;  Location: [facility];  Service: Orthopedics;  Laterality: Right;   CESAREAN SECTION     3   CHOLECYSTECTOMY     ESOPHAGOGASTRODUODENOSCOPY N/A 01/16/2024   Procedure: EGD (ESOPHAGOGASTRODUODENOSCOPY);  Surgeon: [name], MD;  Location: [facility] ENDOSCOPY;  Service: Gastroenterology;  Laterality: N/A;   HIP ARTHROPLASTY Left    KNEE ARTHROPLASTY Bilateral    LEFT ATRIAL APPENDAGE OCCLUSION N/A 04/20/2022   Procedure: LEFT ATRIAL APPENDAGE OCCLUSION;  Surgeon: Boyce Byes, MD;  Location: [facility];  Service: Cardiovascular;  Laterality: N/A;   PERIPHERAL VASCULAR INTERVENTION  07/18/2023   Procedure: PERIPHERAL VASCULAR INTERVENTION;  Surgeon: Wenona Hamilton, MD;  Location: [facility];  Service: Cardiovascular;;   REVERSE SHOULDER  ARTHROPLASTY Right 01/18/2023   Procedure: REVERSE SHOULDER ARTHROPLASTY;  Surgeon: Sammye Cristal, MD;  Location: [facility];  Service: Orthopedics;  Laterality: Right;   TEE WITHOUT CARDIOVERSION N/A 04/20/2022   Procedure: TRANSESOPHAGEAL ECHOCARDIOGRAM (TEE);  Surgeon: Boyce Byes, MD;  Location: [facility] INVASIVE CV LAB;  Service: Cardiovascular;  Laterality: N/A;   VISCERAL ANGIOGRAPHY N/A 07/18/2023   Procedure: MESENTRIC  ANGIOGRAPHY;  Surgeon: Wenona Hamilton, MD;  Location: [facility];  Service: Cardiovascular;  Laterality: N/A;    Family History  Problem Relation Age of Onset   Heart disease Mother    Breast cancer Neg Hx    Colon cancer Neg Hx    Esophageal cancer Neg Hx    Inflammatory bowel disease Neg Hx    Liver disease Neg Hx    Pancreatic cancer Neg Hx    Rectal cancer Neg Hx    Stomach cancer Neg Hx     Allergies  Allergen Reactions   Alendronate  Palpitations   Cefaclor Hives and Rash    Current Outpatient Medications on File Prior to Visit  Medication Sig Dispense Refill   albuterol  (VENTOLIN  HFA) 108 (90 Base) MCG/ACT inhaler Inhale 2 puffs into the lungs every 6 (six) hours as needed for wheezing or shortness of breath. 8 g 0   allopurinol  (ZYLOPRIM ) 100 MG tablet Take 100 mg by mouth at bedtime.     amLODipine  (NORVASC ) 10 MG tablet Take 1 tablet (10 mg total) by mouth daily. (Patient taking differently: Take 5 mg by mouth daily.) 180 tablet 3   amoxicillin -clavulanate (AUGMENTIN ) 875-125 MG tablet Take 1 tablet by mouth 2 (two) times daily for 7 days. 14 tablet 0   Apremilast  (OTEZLA ) 30 MG TABS Take 1 tablet (30 mg total) by mouth 2 (two) times daily. 180 tablet 3   aspirin  EC 81 MG tablet Take 1 tablet (81 mg total) by mouth daily. Swallow whole.     atorvastatin  (LIPITOR) 20 MG tablet TAKE 1 TABLET BY MOUTH DAILY FOR CHOLESTEROL 90 tablet 2   Calcium -Magnesium -Vitamin D (CALCIUM  1200+D3 PO) Take 1 tablet by mouth daily.     Cholecalciferol   (DIALYVITE VITAMIN D 5000) 125 MCG (5000 UT) capsule Take 5,000 Units by mouth daily.     clopidogrel  (PLAVIX ) 75 MG tablet TAKE 1 TABLET(75 MG) BY MOUTH DAILY WITH BREAKFAST 90 tablet 2   cyanocobalamin  1000 MCG tablet Take 1,000 mcg by mouth daily. Vit B12     FARXIGA  10 MG TABS tablet Take 10 mg by mouth every morning.     furosemide  (LASIX ) 20 MG tablet TAKE 1 TABLET(20 MG) BY MOUTH DAILY AS NEEDED 90 tablet 3   ibandronate  (BONIVA ) 150 MG tablet Take 1 tablet (150 mg total) by mouth every 30 (thirty) days. Take in the morning with a full glass of water, on an empty stomach, and do not take anything else by mouth or lie down for the next 30 min. 3 tablet 3   losartan  (COZAAR ) 100 MG tablet TAKE 1 TABLET BY MOUTH DAILY FOR BLOOD PRESSURE 90 tablet 2   magnesium  oxide (MAG-OX) 400 (240 Mg) MG tablet TAKE 1 TABLET(400 MG) BY MOUTH DAILY 90 tablet 3   dexlansoprazole  (DEXILANT ) 60 MG capsule Take 1 capsule (60 mg total) by mouth daily. (Patient not taking: Reported on 02/05/2024) 30 capsule 11   ondansetron  (ZOFRAN -ODT) 4 MG disintegrating tablet Take 1 tablet (4 mg total) by mouth every 8 (eight) hours as needed for nausea or vomiting. (Patient not taking: Reported on 02/05/2024) 30 tablet 0   No current facility-administered medications on file prior to visit.    BP (!) 146/82   Pulse 63   Temp 98.2 F (36.8 C) (Temporal)   Ht 5\' 3"  (1.6 m)   Wt 144 lb (65.3 kg)   SpO2 98%   BMI 25.51 kg/m  Objective:   Physical Exam Cardiovascular:     Rate and Rhythm: Normal rate and regular rhythm.  Pulmonary:     Effort: Pulmonary effort is normal.     Breath sounds: Normal breath sounds.  Musculoskeletal:     Cervical back: Neck supple.  Skin:    General: Skin is warm and dry.  Neurological:     Mental Status: She is alert and oriented to person, place, and time.  Psychiatric:        Mood and Affect: Mood normal.           Assessment & Plan:  Adjustment reaction with anxiety and  depression Assessment & Plan: Secondary to chronic nausea and vomiting over the last year.  Discussed options for treatment.  She declines therapy but opts for medication.  Start Zoloft 25 mg daily.  We discussed possible side effects of headache, GI upset, drowsiness, and SI/HI. Patient verbalized understanding.   Follow up in 4 weeks for re-evaluation.    Orders: -     Sertraline HCl; Take 1 tablet (25 mg total) by mouth daily. for anxiety and depression.  Dispense: 90 tablet; Refill: 0  Aspiration pneumonia of right lower lobe due to regurgitated food [facility]) Assessment & Plan: With recent ED visit. ED notes, labs, imaging reviewed.  Fortunately, she is feeling much better.  Respiratory exam today reassuring.  Continue Augmentin  875-125 mg BID until complete. Follow-up as needed.         Cassiel Fernandez K Besse Miron, NP

## 2024-02-05 NOTE — Patient Instructions (Signed)
 Start sertraline (Zoloft) 25 mg for anxiety and depression.   Schedule a follow-up visit for 1 month for reevaluation of anxiety/depression.  It was a pleasure to see you today!

## 2024-02-05 NOTE — Assessment & Plan Note (Signed)
 Secondary to chronic nausea and vomiting over the last year.  Discussed options for treatment.  She declines therapy but opts for medication.  Start Zoloft 25 mg daily.  We discussed possible side effects of headache, GI upset, drowsiness, and SI/HI. Patient verbalized understanding.   Follow up in 4 weeks for re-evaluation.

## 2024-02-05 NOTE — Assessment & Plan Note (Signed)
 With recent ED visit. ED notes, labs, imaging reviewed.  Fortunately, she is feeling much better.  Respiratory exam today reassuring.  Continue Augmentin  875-125 mg BID until complete. Follow-up as needed.

## 2024-02-15 ENCOUNTER — Ambulatory Visit (INDEPENDENT_AMBULATORY_CARE_PROVIDER_SITE_OTHER)
Admission: RE | Admit: 2024-02-15 | Discharge: 2024-02-15 | Disposition: A | Discharge status: Home / Self Care | Source: Ambulatory Visit | Attending: Primary Care

## 2024-02-15 ENCOUNTER — Ambulatory Visit (INDEPENDENT_AMBULATORY_CARE_PROVIDER_SITE_OTHER): Admitting: Primary Care

## 2024-02-15 ENCOUNTER — Ambulatory Visit: Payer: Self-pay | Admitting: Primary Care

## 2024-02-15 VITALS — BP 166/94 | HR 112 | Temp 99.7°F | Ht 63.0 in | Wt 150.0 lb

## 2024-02-15 DIAGNOSIS — J4520 Mild intermittent asthma, uncomplicated: Secondary | ICD-10-CM | POA: Diagnosis not present

## 2024-02-15 DIAGNOSIS — R051 Acute cough: Secondary | ICD-10-CM | POA: Diagnosis not present

## 2024-02-15 LAB — POC COVID19 BINAXNOW: SARS Coronavirus 2 Ag: NEGATIVE

## 2024-02-15 MED ORDER — PREDNISONE 20 MG PO TABS: ORAL_TABLET | ORAL | 0 refills | Status: DC

## 2024-02-15 MED ORDER — ALBUTEROL SULFATE (2.5 MG/3ML) 0.083% IN NEBU: 2.5000 mg | INHALATION_SOLUTION | Freq: Four times a day (QID) | RESPIRATORY_TRACT | 0 refills | Status: AC | PRN

## 2024-02-15 MED ORDER — AMOXICILLIN-POT CLAVULANATE 875-125 MG PO TABS
1.0000 | ORAL_TABLET | Freq: Two times a day (BID) | ORAL | 0 refills | Status: DC
Start: 2024-02-15 — End: 2024-03-12

## 2024-02-15 NOTE — Progress Notes (Signed)
 Subjective:    Patient ID: Amy Underwood, female    DOB: 09-Jul-1946, 78 y.o.   MRN: [id number]  Cough Associated symptoms include a fever, a sore throat, shortness of breath and wheezing.    Amy Underwood is a very pleasant 79 y.o. female with a history of GERD, asthma, aspiration pneumonia, chronic nausea, gastric bypass surgery, dysphagia who presents today to discuss cough.  Evaluated at [facility] ED on 02/01/2024 for chest tightness and cough after choking on food.  CT chest showed potential pneumonitis/aspiration so she was prescribed an Augmentin  course and discharged home. Evaluated by me on 1625 for ED follow-up.  Cough had improved.   Today she discusses that she completed her 7-day course of Augmentin , felt better until completion.  After completion of her Augmentin  all of her symptoms resumed. Symptoms include congested cough with clear mucous, sore throat from coughing, fatigue, low grade fever, shortness of breath, wheezing.   She's been using her albuterol  inhaler which helps temporarily. She has a hard time using the albuterol  inhaler pump. She denies choking, difficulty swallowing.   Review of Systems  Constitutional:  Positive for fatigue and fever.  HENT:  Positive for congestion and sore throat.   Respiratory:  Positive for cough, shortness of breath and wheezing.          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 Marven Slimmer   Psoriasis     Unintentional weight loss 04/28/2023    Social History   Socioeconomic History   Marital status: Widowed    Spouse name: Not on file   Number of children: 2   Years of education: Not on file   Highest education level: GED or equivalent  Occupational History   Occupation: retired  Tobacco Use   Smoking status: Former    Current packs/day: 2.00    Average packs/day: 2.0 packs/day for 25.8 years (51.7 ttl pk-yrs)    Types: Cigarettes    Start date: 2001   Smokeless tobacco: Former  Building services engineer status: Never Used  Substance and Sexual Activity   Alcohol use: Never   Drug use: Never   Sexual activity: Not Currently  Other Topics Concern   Not on file  Social History Narrative   Not on file   Social Drivers of Health   Financial Resource Strain: Low Risk  (02/15/2024)   Overall Financial Resource Strain (CARDIA)    Difficulty of Paying Living Expenses: Not hard at all  Food Insecurity: No Food Insecurity (02/15/2024)   Hunger Vital Sign    Worried About Running Out of Food in the Last Year: Never true    Ran Out of Food in the Last Year: Never true  Transportation Needs: No Transportation Needs (02/15/2024)   PRAPARE - Administrator, Civil Service (Medical): No    Lack of Transportation (Non-Medical): No  Physical Activity: Insufficiently Active (02/15/2024)   Exercise Vital Sign  Days of Exercise per Week: 1 day    Minutes of Exercise per Session: 30 min  Stress: Stress Concern Present (02/15/2024)   [employer] of Occupational Health - Occupational Stress Questionnaire    Feeling of Stress: To some extent  Social Connections: Moderately Isolated (02/15/2024)   Social Connection and Isolation Panel    Frequency of Communication with Friends and Family: Three times a week    Frequency of Social Gatherings with Friends and Family: More than three times a week    Attends Religious Services: 1 to 4 times per year    Active Member of [employer] or  Organizations: No    Attends Banker Meetings: Not on file    Marital Status: Widowed  Intimate Partner Violence: Not At Risk (05/02/2023)   Humiliation, Afraid, Rape, and Kick questionnaire    Fear of Current or Ex-Partner: No    Emotionally Abused: No    Physically Abused: No    Sexually Abused: No    Past Surgical History:  Procedure Laterality Date   ATRIAL FIBRILLATION ABLATION N/A 01/03/2022   Procedure: ATRIAL FIBRILLATION ABLATION;  Surgeon: Boyce Byes, MD;  Location: [facility];  Service: Cardiovascular;  Laterality: N/A;   CARPAL TUNNEL RELEASE Right 01/18/2023   Procedure: CARPAL TUNNEL RELEASE;  Surgeon: Sammye Cristal, MD;  Location: [facility];  Service: Orthopedics;  Laterality: Right;   CESAREAN SECTION     3   CHOLECYSTECTOMY     ESOPHAGOGASTRODUODENOSCOPY N/A 01/16/2024   Procedure: EGD (ESOPHAGOGASTRODUODENOSCOPY);  Surgeon: [name], MD;  Location: [facility] ENDOSCOPY;  Service: Gastroenterology;  Laterality: N/A;   HIP ARTHROPLASTY Left    KNEE ARTHROPLASTY Bilateral    LEFT ATRIAL APPENDAGE OCCLUSION N/A 04/20/2022   Procedure: LEFT ATRIAL APPENDAGE OCCLUSION;  Surgeon: Boyce Byes, MD;  Location: [facility];  Service: Cardiovascular;  Laterality: N/A;   PERIPHERAL VASCULAR INTERVENTION  07/18/2023   Procedure: PERIPHERAL VASCULAR INTERVENTION;  Surgeon: Wenona Hamilton, MD;  Location: [facility];  Service: Cardiovascular;;   REVERSE SHOULDER ARTHROPLASTY Right 01/18/2023   Procedure: REVERSE SHOULDER ARTHROPLASTY;  Surgeon: Sammye Cristal, MD;  Location: [facility];  Service: Orthopedics;  Laterality: Right;   TEE WITHOUT CARDIOVERSION N/A 04/20/2022   Procedure: TRANSESOPHAGEAL ECHOCARDIOGRAM (TEE);  Surgeon: Boyce Byes, MD;  Location: [facility] INVASIVE CV LAB;  Service: Cardiovascular;  Laterality: N/A;   VISCERAL ANGIOGRAPHY N/A 07/18/2023   Procedure: MESENTRIC  ANGIOGRAPHY;  Surgeon: Wenona Hamilton, MD;   Location: [facility];  Service: Cardiovascular;  Laterality: N/A;    Family History  Problem Relation Age of Onset   Heart disease Mother    Breast cancer Neg Hx    Colon cancer Neg Hx    Esophageal cancer Neg Hx    Inflammatory bowel disease Neg Hx    Liver disease Neg Hx    Pancreatic cancer Neg Hx    Rectal cancer Neg Hx    Stomach cancer Neg Hx     Allergies  Allergen Reactions   Alendronate  Palpitations   Cefaclor Hives and Rash    Current Outpatient Medications on File Prior to Visit  Medication Sig Dispense Refill   allopurinol  (ZYLOPRIM ) 100 MG tablet Take 100 mg by mouth at bedtime.     amLODipine  (NORVASC ) 10 MG tablet Take 1 tablet (10 mg total) by mouth daily. (Patient taking differently: Take 5 mg by mouth daily.) 180 tablet 3   Apremilast  (OTEZLA ) 30 MG TABS Take  1 tablet (30 mg total) by mouth 2 (two) times daily. 180 tablet 3   aspirin  EC 81 MG tablet Take 1 tablet (81 mg total) by mouth daily. Swallow whole.     atorvastatin  (LIPITOR) 20 MG tablet TAKE 1 TABLET BY MOUTH DAILY FOR CHOLESTEROL 90 tablet 2   Calcium -Magnesium -Vitamin D (CALCIUM  1200+D3 PO) Take 1 tablet by mouth daily.     Cholecalciferol  (DIALYVITE VITAMIN D 5000) 125 MCG (5000 UT) capsule Take 5,000 Units by mouth daily.     clopidogrel  (PLAVIX ) 75 MG tablet TAKE 1 TABLET(75 MG) BY MOUTH DAILY WITH BREAKFAST 90 tablet 2   cyanocobalamin  1000 MCG tablet Take 1,000 mcg by mouth daily. Vit B12     FARXIGA  10 MG TABS tablet Take 10 mg by mouth every morning.     furosemide  (LASIX ) 20 MG tablet TAKE 1 TABLET(20 MG) BY MOUTH DAILY AS NEEDED 90 tablet 3   ibandronate  (BONIVA ) 150 MG tablet Take 1 tablet (150 mg total) by mouth every 30 (thirty) days. Take in the morning with a full glass of water, on an empty stomach, and do not take anything else by mouth or lie down for the next 30 min. 3 tablet 3   losartan  (COZAAR ) 100 MG tablet TAKE 1 TABLET BY MOUTH DAILY FOR BLOOD PRESSURE 90 tablet 2    magnesium  oxide (MAG-OX) 400 (240 Mg) MG tablet TAKE 1 TABLET(400 MG) BY MOUTH DAILY 90 tablet 3   sertraline  (ZOLOFT ) 25 MG tablet Take 1 tablet (25 mg total) by mouth daily. for anxiety and depression. 90 tablet 0   dexlansoprazole  (DEXILANT ) 60 MG capsule Take 1 capsule (60 mg total) by mouth daily. (Patient not taking: Reported on 02/15/2024) 30 capsule 11   ondansetron  (ZOFRAN -ODT) 4 MG disintegrating tablet Take 1 tablet (4 mg total) by mouth every 8 (eight) hours as needed for nausea or vomiting. (Patient not taking: Reported on 02/15/2024) 30 tablet 0   No current facility-administered medications on file prior to visit.    BP (!) 166/94   Pulse (!) 112   Temp 99.7 F (37.6 C) (Temporal)   Ht 5' 3 (1.6 m)   Wt 150 lb (68 kg)   SpO2 98%   BMI 26.57 kg/m  Objective:   Physical Exam Constitutional:      Appearance: She is ill-appearing.  HENT:     Right Ear: Tympanic membrane and ear canal normal.     Left Ear: Tympanic membrane and ear canal normal.     Nose: No mucosal edema.     Right Sinus: No maxillary sinus tenderness or frontal sinus tenderness.     Left Sinus: No maxillary sinus tenderness or frontal sinus tenderness.     Mouth/Throat:     Mouth: Mucous membranes are moist.   Eyes:     Conjunctiva/sclera: Conjunctivae normal.    Cardiovascular:     Rate and Rhythm: Regular rhythm. Tachycardia present.  Pulmonary:     Effort: Pulmonary effort is normal.     Breath sounds: Examination of the right-upper field reveals wheezing and rhonchi. Examination of the left-upper field reveals wheezing. Examination of the right-lower field reveals wheezing and rhonchi. Wheezing and rhonchi present.   Musculoskeletal:     Cervical back: Neck supple.   Skin:    General: Skin is warm and dry.           Assessment & Plan:  Acute cough Assessment & Plan: Differentials including recurrent aspiration pneumonia, COVID, other viral infection, bronchitis  with asthma  exacerbation.  Negative rapid COVID-19 test in the office today.  Stat chest x-ray ordered and pending.  Start Augmentin  antibiotics. Take 1 tablet by mouth twice daily for 10 days.  No additional treatment with macrolide or doxycycline was recommended for potential aspiration pneumonia per up-to-date.  Start prednisone  20 mg tablets. Take 2 tablets by mouth once daily in the morning for 5 days.  ED precautions provided.  She does appear to table the office for outpatient treatment.  She is not hypoxic.     Orders: -     predniSONE ; Take 2 tablets by mouth once daily in the morning for 5 days.  Dispense: 10 tablet; Refill: 0 -     DG Chest 2 View -     Amoxicillin -Pot Clavulanate; Take 1 tablet by mouth 2 (two) times daily.  Dispense: 20 tablet; Refill: 0  Mild intermittent asthma without complication Assessment & Plan: Unable to use albuterol  inhaler effectively.  Nebulizer machine provided in the office today. Prescription for albuterol  another solution sent to pharmacy.  Discussed instructions for use.  Orders: -     Albuterol  Sulfate; Take 3 mLs (2.5 mg total) by nebulization every 6 (six) hours as needed for wheezing or shortness of breath.  Dispense: 150 mL; Refill: 0        Gabriel John, NP

## 2024-02-15 NOTE — Patient Instructions (Addendum)
 Start Augmentin  antibiotics. Take 1 tablet by mouth twice daily for 10 days.  Start prednisone  20 mg tablets. Take 2 tablets by mouth once daily in the morning for 5 days.  You may use the albuterol  nebulizer treatments every 4-6 hours as needed for wheezing and chest tightness.  Complete xray(s) prior to leaving today. I will notify you of your results once received.  Go to the hospital if you feel worse  It was a pleasure to see you today!

## 2024-02-15 NOTE — Addendum Note (Signed)
 Addended by: Kyle Pho on: 02/15/2024 02:51 PM   Modules accepted: Orders

## 2024-02-15 NOTE — Assessment & Plan Note (Signed)
 Unable to use albuterol  inhaler effectively.  Nebulizer machine provided in the office today. Prescription for albuterol  another solution sent to pharmacy.  Discussed instructions for use.

## 2024-02-15 NOTE — Assessment & Plan Note (Signed)
 Differentials including recurrent aspiration pneumonia, COVID, other viral infection, bronchitis with asthma exacerbation.  Negative rapid COVID-19 test in the office today.  Stat chest x-ray ordered and pending.  Start Augmentin  antibiotics. Take 1 tablet by mouth twice daily for 10 days.  No additional treatment with macrolide or doxycycline was recommended for potential aspiration pneumonia per up-to-date.  Start prednisone  20 mg tablets. Take 2 tablets by mouth once daily in the morning for 5 days.  ED precautions provided.  She does appear to table the office for outpatient treatment.  She is not hypoxic.

## 2024-02-18 NOTE — Progress Notes (Unsigned)
 Cardiology Office Note Date:  02/20/2024  Patient ID:  Amy Underwood, Amy Underwood [birth year], MRN [id number] PCP:  Gabriel John, NP  Cardiologist:  None Electrophysiologist: Boyce Byes, MD   Chief Complaint: afib follow-up  History of Present Illness: Amy Underwood is a 78 y.o. female with PMH notable for persis Afib, s/p LAAO with watchman, HFpEF, chronic mesenteric ischemia s/p recent stent, HTN; seen today for Boyce Byes, MD for routine EP follow-up.   She is s/p atypical clockwise flutter ablation w CTI, AF ablation with PVI, Posterior wall on 01/2022.  She is s/p LAAO with watchman 04/2022. Tikosyn  stopped 11/2022 d/t no AFib. She last saw Dr. Marven Slimmer 02/2023 was doing well off tikosyn .  She has since had reoccurrence of AFib. She was not interested in reloading of tikosyn  and so amiodarone  was started.   I last saw her 09/2023 where here EKG was concerning for junctional rhythm, and amio was reduced. It has since been stopped for ongoing concerns of junctional rhythm.   On follow-up today, she was recently diagnosed with pneumonia and was rx prednisone , albuteral nebs, and abx. She finishes prednisone  today. She feels much better since treatment started. From a cardiac standpoint, she feels very well. Historically she could feel her afib/aflutter episodes and has not had any episodes recently. She has noticed that her heart races when she uses albuterol  but has only needed it a few times.  Her fluid status is the best it has been in a long while, she takes lasix  maybe 1-2 times a month, much improved from needing it daily.   No palpitations currently, chest pain, chest pressure, or edema.    AAD History: Tikosyn  - stopped 11/2022 d/t no AFib Amiodarone  - stopped 11/2023 d/t junctional    ROS:  Please see the history of present illness. All other systems are reviewed and otherwise negative.   PHYSICAL EXAM:  VS:  BP 128/72 (BP Location: Left Arm, Patient Position:  Sitting, Cuff Size: Normal)   Pulse (!) 115   Ht 5' 3 (1.6 m)   Wt 148 lb 9.6 oz (67.4 kg)   SpO2 98%   BMI 26.32 kg/m  BMI: Body mass index is 26.32 kg/m.  Wt Readings from Last 3 Encounters:  02/19/24 148 lb 9.6 oz (67.4 kg)  02/15/24 150 lb (68 kg)  02/05/24 144 lb (65.3 kg)      GEN- The patient is well appearing, alert and oriented x 3 today.   Lungs- Clear to ausculation bilaterally, normal work of breathing.  Heart- Regular, tachycardic rate and rhythm, no murmurs, rubs or gallops Extremities- No peripheral edema, warm, dry   EKG is ordered. Personal review of EKG from today shows:   EKG Interpretation Date/Time:  Tuesday February 19 2024 13:39:06 EDT Ventricular Rate:  115 PR Interval:    QRS Duration:  72 QT Interval:  332 QTC Calculation: 459 R Axis:   -83  Text Interpretation: Accelerated Junctional rhythm Left axis deviation Low voltage QRS Inferior infarct , age undetermined Confirmed by Halima Fogal [phone]) on 02/20/2024 7:51:23 AM     Additional studies reviewed include: Previous EP, cardiology notes.   TTE, 10/09/2023 1. Left ventricular ejection fraction, by estimation, is 55 to 60%. The left ventricle has normal function. The left ventricle has no regional wall motion abnormalities. Left ventricular diastolic parameters are indeterminate. The average left ventricular global longitudinal strain is -18.5 %. The global longitudinal strain is normal.   2. Right ventricular systolic function is  normal. The right ventricular size is normal. There is mildly elevated pulmonary artery systolic pressure. The estimated right ventricular systolic pressure is 40.8 mmHg.  3. The mitral valve is normal in structure. Moderate mitral valve regurgitation. No evidence of mitral stenosis.   4. Tricuspid valve regurgitation is moderate.   5. The aortic valve is calcified. There is moderate calcification of the aortic valve. Aortic valve regurgitation is mild. Aortic valve sclerosis  is present, with no evidence of aortic valve stenosis. Aortic valve mean gradient measures 7.0 mmHg.   6. The inferior vena cava is normal in size with greater than 50% respiratory variability, suggesting right atrial pressure of 3 mmHg.   Long term monitor, 05/28/2023 HR 51 - 245, average 94 bpm. 80% burden of AF/AFL, average 99 bpm. 37 SVT, longest 13.2 seconds with an average rate of 128 bpm. AF/AFL present at the deactivation of device. Rare supraventricular and ventricular ectopy.  CT cardiac, 06/22/2022 1. Well placed 31 mm Watchman FLX with no leak, full endothelialization and average compression 18%  2.  Moderate bi atrial enlargement  3. 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  Normal ascending thoracic aorta 3.5 cm  5.  No perciardial effusion  6.  Normal PV anatomy measurements above  TTE, 10/27/2021  1. Left ventricular ejection fraction, by estimation, is 60 to 65%. Left ventricular ejection fraction by 2D MOD biplane is 63.8 %. The left ventricle has normal function. The left ventricle has no regional wall motion abnormalities. There is moderate left ventricular hypertrophy of the basal-septal segment. Left ventricular diastolic parameters are consistent with Grade II diastolic dysfunction (pseudonormalization). The average left ventricular global longitudinal strain is -17.8 %. The global longitudinal strain is normal.   2. Right ventricular systolic function is normal. The right ventricular size is normal. There is normal pulmonary artery systolic pressure.   3. Left atrial size was mildly dilated.   4. The mitral valve is degenerative. Mild mitral valve regurgitation.   5. The aortic valve is calcified. Aortic valve regurgitation is mild. Mild aortic valve stenosis. Aortic valve mean gradient measures 8.0 mmHg.   6. The inferior vena cava is normal in size with greater than 50% respiratory variability, suggesting right atrial pressure of 3 mmHg.     ASSESSMENT AND PLAN:  #) parox AFib #) high risk medication use - amiodarone  S/p ablation 01/2022 Recent zio with significant AFib/flutter burden Amio stopped d/t junctional rhythm EKG today appears accelerated junctional, though I am concerned for SVT vs aflutter with her exquisitely small p-waves. Thankfully, she feels very well without palpitations or increased edema.   Discussed with Dr. Marven Slimmer, recommended 3 day monitor to eval for HR variability   #) LAAO with Watchman Watchman well-seated, no OAC indicated   #) HTN #) lower extremity edema Well-controlled today Continue 5mg  amlodipine , 100mg  losartan , 20mg  lasix  PRN     Current medicines are reviewed at length with the patient today.   The patient has concerns regarding her medicines.  The following changes were made today:   none  Labs/ tests ordered today include:  Orders Placed This Encounter  Procedures   EKG 12-Lead     Disposition: Follow up with Dr. Marven Slimmer or EP APP in 2 weeks   Signed, Tilak Oakley, NP  02/20/24  7:57 AM  Electrophysiology [facility]

## 2024-02-19 ENCOUNTER — Ambulatory Visit: Attending: Cardiology | Admitting: Cardiology

## 2024-02-19 VITALS — BP 128/72 | HR 115 | Ht 63.0 in | Wt 148.6 lb

## 2024-02-19 DIAGNOSIS — I498 Other specified cardiac arrhythmias: Secondary | ICD-10-CM

## 2024-02-19 DIAGNOSIS — I4819 Other persistent atrial fibrillation: Secondary | ICD-10-CM

## 2024-02-19 DIAGNOSIS — Z95818 Presence of other cardiac implants and grafts: Secondary | ICD-10-CM

## 2024-02-19 NOTE — Patient Instructions (Signed)
 Medication Instructions:  The current medical regimen is effective;  continue present plan and medications as directed. Please refer to the Current Medication list given to you today.   *If you need a refill on your cardiac medications before your next appointment, please call your pharmacy*  Follow-Up: At [facility], you and your health needs are our priority.  As part of our continuing mission to provide you with exceptional heart care, our providers are all part of one team.  This team includes your primary Cardiologist (physician) and Advanced Practice Providers or APPs (Physician Assistants and Nurse Practitioners) who all work together to provide you with the care you need, when you need it.  Your next appointment:   July 1st at 2:00 PM   Provider:   Suzann Riddle, NP    We recommend signing up for the patient portal called MyChart.  Sign up information is provided on this After Visit Summary.  MyChart is used to connect with patients for Virtual Visits (Telemedicine).  Patients are able to view lab/test results, encounter notes, upcoming appointments, etc.  Non-urgent messages can be sent to your provider as well.   To learn more about what you can do with MyChart, go to [email or web address].

## 2024-02-20 ENCOUNTER — Ambulatory Visit: Attending: Cardiology

## 2024-02-20 DIAGNOSIS — I4819 Other persistent atrial fibrillation: Secondary | ICD-10-CM

## 2024-02-20 DIAGNOSIS — I498 Other specified cardiac arrhythmias: Secondary | ICD-10-CM

## 2024-03-04 ENCOUNTER — Ambulatory Visit: Admitting: Cardiology

## 2024-03-06 ENCOUNTER — Ambulatory Visit: Admitting: Primary Care

## 2024-03-11 NOTE — Progress Notes (Unsigned)
 Cardiology Office Note Date:  03/12/2024  Patient ID:  Amy Underwood, Amy Underwood Nov 30, 1945, MRN [id number] PCP:  Gretta Comer POUR, NP  Cardiologist:  None Electrophysiologist: OLE ONEIDA HOLTS, MD   Chief Complaint: afib follow-up  History of Present Illness: [name] is a 78 y.o. female with PMH notable for persis Afib, s/p LAAO with watchman, HFpEF, chronic mesenteric ischemia s/p recent stent, HTN; seen today for OLE ONEIDA HOLTS, MD for routine EP follow-up.   She is s/p atypical clockwise flutter ablation w CTI, AF ablation with PVI, Posterior wall on 01/2022.  She is s/p LAAO with watchman 04/2022. Tikosyn  stopped 11/2022 d/t no AFib. She last saw Dr. HOLTS 02/2023 was doing well off tikosyn .  She has since had reoccurrence of AFib. She was not interested in reloading of tikosyn  and so amiodarone  was started.   I saw her 09/2023 where here EKG was concerning for junctional rhythm, and amio was reduced. It has since been stopped for ongoing concerns of junctional rhythm.   I last saw her 02/2024 where she appeared to be in SVT, was concerned it was due to PNA and albuterol  use. She overall felt well and did not feel as though she was in afib/flutter. Updated zio showed 100% AFib/flutter burden.   On follow-up today, she continues to overall feel well. She denies chest pain, chest pressure, palpitations. She uses apple watch to monitor HR and it continues to bounce around, but she does not feel like she is in afib.     AAD History: Tikosyn  - stopped 11/2022 d/t no AFib Amiodarone  - stopped 11/2023 d/t junctional    ROS:  Please see the history of present illness. All other systems are reviewed and otherwise negative.   PHYSICAL EXAM:  VS:  BP (!) 142/72 (BP Location: Left Arm, Patient Position: Sitting, Cuff Size: Normal)   Pulse (!) 124  BMI: There is no height or weight on file to calculate BMI.  Wt Readings from Last 3 Encounters:  02/19/24 148 lb 9.6 oz (67.4 kg)   02/15/24 150 lb (68 kg)  02/05/24 144 lb (65.3 kg)      GEN- The patient is well appearing, alert and oriented x 3 today.   Lungs- Clear to ausculation bilaterally, normal work of breathing.  Heart- Irregularly irregular, tachycardic rate and rhythm, no murmurs, rubs or gallops Extremities- No peripheral edema, warm, dry   EKG is ordered. Personal review of EKG from today shows:   EKG Interpretation Date/Time:  Wednesday March 12 2024 13:59:29 EDT Ventricular Rate:  124 PR Interval:    QRS Duration:  76 QT Interval:  342 QTC Calculation: 491 R Axis:   -75  Text Interpretation: Accelerated Junctional rhythm with Premature ventricular complexes or Fusion complexes Left axis deviation Confirmed by Dara Camargo [phone]) on 03/12/2024 2:04:26 PM     Additional studies reviewed include: Previous EP, cardiology notes.   Long term monitor, 03/10/2024 (unofficial results) 2 Ventricular Tachycardia runs occurred, the run with the fastest interval lasting 4 beats with a max rate of 185 bpm, the longest lasting 4 beats with an avg rate of 140 bpm. Atrial Flutter occurred continuously (100% burden), ranging from 51-122 bpm  (avg of 97 bpm). Isolated VEs were occasional (4.1%, 16447), VE Couplets were rare (<1.0%, 17), and VE Triplets were rare (<1.0%, 2). Ventricular Bigeminy and Trigeminy were present.   TTE, 10/09/2023 1. Left ventricular ejection fraction, by estimation, is 55 to 60%. The left ventricle has normal function. The left  ventricle has no regional wall motion abnormalities. Left ventricular diastolic parameters are indeterminate. The average left ventricular global longitudinal strain is -18.5 %. The global longitudinal strain is normal.   2. Right ventricular systolic function is normal. The right ventricular size is normal. There is mildly elevated pulmonary artery systolic pressure. The estimated right ventricular systolic pressure is 40.8 mmHg.  3. The mitral valve is normal in  structure. Moderate mitral valve regurgitation. No evidence of mitral stenosis.   4. Tricuspid valve regurgitation is moderate.   5. The aortic valve is calcified. There is moderate calcification of the aortic valve. Aortic valve regurgitation is mild. Aortic valve sclerosis is present, with no evidence of aortic valve stenosis. Aortic valve mean gradient measures 7.0 mmHg.   6. The inferior vena cava is normal in size with greater than 50% respiratory variability, suggesting right atrial pressure of 3 mmHg.   Long term monitor, 05/28/2023 HR 51 - 245, average 94 bpm. 80% burden of AF/AFL, average 99 bpm. 37 SVT, longest 13.2 seconds with an average rate of 128 bpm. AF/AFL present at the deactivation of device. Rare supraventricular and ventricular ectopy.  CT cardiac, 06/22/2022 1. Well placed 31 mm Watchman FLX with no leak, full endothelialization and average compression 18%  2.  Moderate bi atrial enlargement  3. 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  Normal ascending thoracic aorta 3.5 cm  5.  No perciardial effusion  6.  Normal PV anatomy measurements above  TTE, 10/27/2021  1. Left ventricular ejection fraction, by estimation, is 60 to 65%. Left ventricular ejection fraction by 2D MOD biplane is 63.8 %. The left ventricle has normal function. The left ventricle has no regional wall motion abnormalities. There is moderate left ventricular hypertrophy of the basal-septal segment. Left ventricular diastolic parameters are consistent with Grade II diastolic dysfunction (pseudonormalization). The average left ventricular global longitudinal strain is -17.8 %. The global longitudinal strain is normal.   2. Right ventricular systolic function is normal. The right ventricular size is normal. There is normal pulmonary artery systolic pressure.   3. Left atrial size was mildly dilated.   4. The mitral valve is degenerative. Mild mitral valve regurgitation.   5.  The aortic valve is calcified. Aortic valve regurgitation is mild. Mild aortic valve stenosis. Aortic valve mean gradient measures 8.0 mmHg.   6. The inferior vena cava is normal in size with greater than 50% respiratory variability, suggesting right atrial pressure of 3 mmHg.    ASSESSMENT AND PLAN:  #) parox AFib  #) aflutter S/p AF, Aflutter ablation 01/2022 Amio stopped d/t junctional rhythm Recent zio with 100% AFib/flutter burden We discussed DCCV, and patient is agreeable Pre-procedure CBC, BMP today  #) LAAO with Watchman Watchman well-seated Per protocol, she will start eliquis  5mg  BID for 3 doses prior to DCCV, and then continue eliquis  for 4 weeks post-DCCV  #) HTN Slightly elevated BP today, will re-assess when in sinus rhythm Continue 5mg  amlodipine , 100mg  losartan , 20mg  lasix  PRN     Informed Consent   Shared Decision Making/Informed Consent The risks (stroke, cardiac arrhythmias rarely resulting in the need for a temporary or permanent pacemaker, skin irritation or burns and complications associated with conscious sedation including aspiration, arrhythmia, respiratory failure and death), benefits (restoration of normal sinus rhythm) and alternatives of a direct current cardioversion were explained in detail to Ms. Hulsebus and she agrees to proceed.        Current medicines are reviewed  at length with the patient today.   The patient has concerns regarding her medicines.  The following changes were made today:   Start 5mg  eliquis  BID on 7/12 with evening dose, then continue BID until 8/11  Labs/ tests ordered today include:  Orders Placed This Encounter  Procedures   Basic metabolic panel with GFR   CBC   EKG 12-Lead     Disposition: Follow up with Dr. Cindie or EP APP as usual post procedure   Signed, Chantal Needle, NP  03/12/24  2:35 PM  Electrophysiology [facility]

## 2024-03-11 NOTE — H&P (View-Only) (Signed)
 Cardiology Office Note Date:  03/12/2024  Patient ID:  Amy Underwood [birth year], MRN [id number] PCP:  Gretta Comer POUR, NP  Cardiologist:  None Electrophysiologist: OLE ONEIDA HOLTS, MD   Chief Complaint: afib follow-up  History of Present Illness: Amy Underwood is a 78 y.o. female with PMH notable for persis Afib, s/p LAAO with watchman, HFpEF, chronic mesenteric ischemia s/p recent stent, HTN; seen today for OLE ONEIDA HOLTS, MD for routine EP follow-up.   She is s/p atypical clockwise flutter ablation w CTI, AF ablation with PVI, Posterior wall on 01/2022.  She is s/p LAAO with watchman 04/2022. Tikosyn  stopped 11/2022 d/t no AFib. She last saw Dr. HOLTS 02/2023 was doing well off tikosyn .  She has since had reoccurrence of AFib. She was not interested in reloading of tikosyn  and so amiodarone  was started.   I saw her 09/2023 where here EKG was concerning for junctional rhythm, and amio was reduced. It has since been stopped for ongoing concerns of junctional rhythm.   I last saw her 02/2024 where she appeared to be in SVT, was concerned it was due to PNA and albuterol  use. She overall felt well and did not feel as though she was in afib/flutter. Updated zio showed 100% AFib/flutter burden.   On follow-up today, she continues to overall feel well. She denies chest pain, chest pressure, palpitations. She uses apple watch to monitor HR and it continues to bounce around, but she does not feel like she is in afib.     AAD History: Tikosyn  - stopped 11/2022 d/t no AFib Amiodarone  - stopped 11/2023 d/t junctional    ROS:  Please see the history of present illness. All other systems are reviewed and otherwise negative.   PHYSICAL EXAM:  VS:  BP (!) 142/72 (BP Location: Left Arm, Patient Position: Sitting, Cuff Size: Normal)   Pulse (!) 124  BMI: There is no height or weight on file to calculate BMI.  Wt Readings from Last 3 Encounters:  02/19/24 148 lb 9.6 oz (67.4 kg)   02/15/24 150 lb (68 kg)  02/05/24 144 lb (65.3 kg)      GEN- The patient is well appearing, alert and oriented x 3 today.   Lungs- Clear to ausculation bilaterally, normal work of breathing.  Heart- Irregularly irregular, tachycardic rate and rhythm, no murmurs, rubs or gallops Extremities- No peripheral edema, warm, dry   EKG is ordered. Personal review of EKG from today shows:   EKG Interpretation Date/Time:  Wednesday March 12 2024 13:59:29 EDT Ventricular Rate:  124 PR Interval:    QRS Duration:  76 QT Interval:  342 QTC Calculation: 491 R Axis:   -75  Text Interpretation: Accelerated Junctional rhythm with Premature ventricular complexes or Fusion complexes Left axis deviation Confirmed by Jaeshawn Silvio [phone]) on 03/12/2024 2:04:26 PM     Additional studies reviewed include: Previous EP, cardiology notes.   Long term monitor, 03/10/2024 (unofficial results) 2 Ventricular Tachycardia runs occurred, the run with the fastest interval lasting 4 beats with a max rate of 185 bpm, the longest lasting 4 beats with an avg rate of 140 bpm. Atrial Flutter occurred continuously (100% burden), ranging from 51-122 bpm  (avg of 97 bpm). Isolated VEs were occasional (4.1%, 16447), VE Couplets were rare (<1.0%, 17), and VE Triplets were rare (<1.0%, 2). Ventricular Bigeminy and Trigeminy were present.   TTE, 10/09/2023 1. Left ventricular ejection fraction, by estimation, is 55 to 60%. The left ventricle has normal function. The left  ventricle has no regional wall motion abnormalities. Left ventricular diastolic parameters are indeterminate. The average left ventricular global longitudinal strain is -18.5 %. The global longitudinal strain is normal.   2. Right ventricular systolic function is normal. The right ventricular size is normal. There is mildly elevated pulmonary artery systolic pressure. The estimated right ventricular systolic pressure is 40.8 mmHg.  3. The mitral valve is normal in  structure. Moderate mitral valve regurgitation. No evidence of mitral stenosis.   4. Tricuspid valve regurgitation is moderate.   5. The aortic valve is calcified. There is moderate calcification of the aortic valve. Aortic valve regurgitation is mild. Aortic valve sclerosis is present, with no evidence of aortic valve stenosis. Aortic valve mean gradient measures 7.0 mmHg.   6. The inferior vena cava is normal in size with greater than 50% respiratory variability, suggesting right atrial pressure of 3 mmHg.   Long term monitor, 05/28/2023 HR 51 - 245, average 94 bpm. 80% burden of AF/AFL, average 99 bpm. 37 SVT, longest 13.2 seconds with an average rate of 128 bpm. AF/AFL present at the deactivation of device. Rare supraventricular and ventricular ectopy.  CT cardiac, 06/22/2022 1. Well placed 31 mm Watchman FLX with no leak, full endothelialization and average compression 18%  2.  Moderate bi atrial enlargement  3. 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  Normal ascending thoracic aorta 3.5 cm  5.  No perciardial effusion  6.  Normal PV anatomy measurements above  TTE, 10/27/2021  1. Left ventricular ejection fraction, by estimation, is 60 to 65%. Left ventricular ejection fraction by 2D MOD biplane is 63.8 %. The left ventricle has normal function. The left ventricle has no regional wall motion abnormalities. There is moderate left ventricular hypertrophy of the basal-septal segment. Left ventricular diastolic parameters are consistent with Grade II diastolic dysfunction (pseudonormalization). The average left ventricular global longitudinal strain is -17.8 %. The global longitudinal strain is normal.   2. Right ventricular systolic function is normal. The right ventricular size is normal. There is normal pulmonary artery systolic pressure.   3. Left atrial size was mildly dilated.   4. The mitral valve is degenerative. Mild mitral valve regurgitation.   5.  The aortic valve is calcified. Aortic valve regurgitation is mild. Mild aortic valve stenosis. Aortic valve mean gradient measures 8.0 mmHg.   6. The inferior vena cava is normal in size with greater than 50% respiratory variability, suggesting right atrial pressure of 3 mmHg.    ASSESSMENT AND PLAN:  #) parox AFib  #) aflutter S/p AF, Aflutter ablation 01/2022 Amio stopped d/t junctional rhythm Recent zio with 100% AFib/flutter burden We discussed DCCV, and patient is agreeable Pre-procedure CBC, BMP today  #) LAAO with Watchman Watchman well-seated Per protocol, she will start eliquis  5mg  BID for 3 doses prior to DCCV, and then continue eliquis  for 4 weeks post-DCCV  #) HTN Slightly elevated BP today, will re-assess when in sinus rhythm Continue 5mg  amlodipine , 100mg  losartan , 20mg  lasix  PRN     Informed Consent   Shared Decision Making/Informed Consent The risks (stroke, cardiac arrhythmias rarely resulting in the need for a temporary or permanent pacemaker, skin irritation or burns and complications associated with conscious sedation including aspiration, arrhythmia, respiratory failure and death), benefits (restoration of normal sinus rhythm) and alternatives of a direct current cardioversion were explained in detail to Amy Underwood and she agrees to proceed.        Current medicines are reviewed  at length with the patient today.   The patient has concerns regarding her medicines.  The following changes were made today:   Start 5mg  eliquis  BID on 7/12 with evening dose, then continue BID until 8/11  Labs/ tests ordered today include:  Orders Placed This Encounter  Procedures   Basic metabolic panel with GFR   CBC   EKG 12-Lead     Disposition: Follow up with Dr. Cindie or EP APP as usual post procedure   Signed, Chantal Needle, NP  03/12/24  2:35 PM  Electrophysiology [facility]

## 2024-03-12 ENCOUNTER — Telehealth (HOSPITAL_COMMUNITY): Payer: Self-pay | Admitting: Pharmacy Technician

## 2024-03-12 ENCOUNTER — Ambulatory Visit: Payer: Medicare Other | Admitting: Dermatology

## 2024-03-12 ENCOUNTER — Other Ambulatory Visit: Payer: Self-pay

## 2024-03-12 ENCOUNTER — Encounter: Payer: Self-pay | Admitting: Dermatology

## 2024-03-12 ENCOUNTER — Ambulatory Visit: Attending: Cardiology | Admitting: Cardiology

## 2024-03-12 VITALS — BP 142/72 | HR 124

## 2024-03-12 DIAGNOSIS — Z7189 Other specified counseling: Secondary | ICD-10-CM | POA: Diagnosis not present

## 2024-03-12 DIAGNOSIS — I4819 Other persistent atrial fibrillation: Secondary | ICD-10-CM | POA: Diagnosis not present

## 2024-03-12 DIAGNOSIS — I498 Other specified cardiac arrhythmias: Secondary | ICD-10-CM

## 2024-03-12 DIAGNOSIS — I4892 Unspecified atrial flutter: Secondary | ICD-10-CM

## 2024-03-12 DIAGNOSIS — Z79899 Other long term (current) drug therapy: Secondary | ICD-10-CM

## 2024-03-12 DIAGNOSIS — L720 Epidermal cyst: Secondary | ICD-10-CM

## 2024-03-12 DIAGNOSIS — Z95818 Presence of other cardiac implants and grafts: Secondary | ICD-10-CM

## 2024-03-12 DIAGNOSIS — L729 Follicular cyst of the skin and subcutaneous tissue, unspecified: Secondary | ICD-10-CM

## 2024-03-12 DIAGNOSIS — L409 Psoriasis, unspecified: Secondary | ICD-10-CM | POA: Diagnosis not present

## 2024-03-12 MED ORDER — TRIAMCINOLONE ACETONIDE 0.1 % EX CREA: 1.0000 | TOPICAL_CREAM | Freq: Two times a day (BID) | CUTANEOUS | 3 refills | Status: AC | PRN

## 2024-03-12 MED ORDER — OTEZLA 30 MG PO TABS: 30.0000 mg | ORAL_TABLET | Freq: Two times a day (BID) | ORAL | 5 refills | Status: DC

## 2024-03-12 MED ORDER — APIXABAN 5 MG PO TABS: 5.0000 mg | ORAL_TABLET | Freq: Two times a day (BID) | ORAL | 0 refills | Status: DC

## 2024-03-12 NOTE — Telephone Encounter (Addendum)
 Patient Product/process development scientist completed.    The patient is insured through [facility]. Patient has Medicare and is not eligible for a copay card, but may be able to apply for patient assistance or Medicare RX Payment Plan (Patient Must reach out to their plan, if eligible for payment plan), if available.    Ran test claim for Eliquis  5 mg and the current 30 day co-pay is $337.60 due to a deductible  Will be $40.00 once deductible is met.   This test claim was processed through [facility]- copay amounts may vary at other pharmacies due to pharmacy/plan contracts, or as the patient moves through the different stages of their insurance plan.     Reyes Sharps, CPHT Pharmacy Technician III Certified Patient Advocate [facility] Patient Advocate Team Direct Number: [phone]  Fax: [phone]

## 2024-03-12 NOTE — Patient Instructions (Signed)

## 2024-03-12 NOTE — Progress Notes (Signed)
   Follow-Up Visit   Subjective  Amy Underwood is a 78 y.o. female who presents for the following: Psoriasis f/u on face,elbows, knees and hands, patient ran out of Otezla  1 month ago, psoriasis flaring up on her face. Taking Otezla  30 mg tablet twice a day with a good response- skin mainly clear, no headaches, no dizziness, no headaches, no diarrhea, no side effects.   Patient report she was recently hospitalized from heart related problems.   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   Patient would like bumps on her back and shoulder evaluated.  She would like consider removal.  The following portions of the chart were reviewed this encounter and updated as appropriate: medications, allergies, medical history  Review of Systems:  No other skin or systemic complaints except as noted in HPI or Assessment and Plan.  Objective  Well appearing patient in no apparent distress; mood and affect are within normal limits.  Areas Examined:   Relevant exam findings are noted in the Assessment and Plan.           Assessment & Plan   CYST OF SKIN   COUNSELING AND COORDINATION OF CARE   MEDICATION MANAGEMENT   LONG-TERM USE OF HIGH-RISK MEDICATION   PSORIASIS    PSORIASIS Psoriasis is flared due to patient being off medication for about a month due to stress  Chronic and persistent condition with duration or expected duration over one year. Condition is bothersome/symptomatic for patient. Currently flared.  Patient denies headaches or depression.  He has had GI symptoms and weight loss but does not feel it is related to her Otezla .  She feels it is related to her gastric bypass surgery.  She has been evaluated for that recently and is currently still under evaluation.  Well-demarcated erythematous papules/plaques with silvery scale, guttate pink  scaly papules.  10% BSA.  Treatment Plan: Continue Triamcinolone  cream qd-bid prn Restart Otezla  30 mg bid   Discussed with patient if any upset stomach, diarrhea, weight loss she should stop Otezla  tablets and give us  a call.   Counseling on psoriasis and coordination of care  psoriasis is a chronic non-curable, but treatable genetic/hereditary disease that may have other systemic features affecting other organ systems such as joints (Psoriatic Arthritis). It is associated with an increased risk of inflammatory bowel disease, heart disease, non-alcoholic fatty liver disease, and depression.  Treatments include light and laser treatments; topical medications; and systemic medications including oral and injectables.   EPIDERMAL INCLUSION CYST Exam: ~0.5 cm Subcutaneous nodule at left spinal and left upper back  Benign-appearing. Exam most consistent with an epidermal inclusion cyst. Discussed that a cyst is a benign growth that can grow over time and sometimes get irritated or inflamed. Recommend observation if it is not bothersome. Discussed option of surgical excision to remove it if it is growing, symptomatic, or other changes noted. Please call for new or changing lesions so they can be evaluated.   Return for surgery with Dr Jackquline    Return in about 6 months (around 09/12/2024) for Psoriasis and cyst surgery with Dr Jackquline.  IFay Kirks, CMA, am acting as scribe for Alm Rhyme, MD .   Documentation: I have reviewed the above documentation for accuracy and completeness, and I agree with the above.  Alm Rhyme, MD

## 2024-03-12 NOTE — Patient Instructions (Addendum)
 Medication Instructions:  Take Eliquis  5 mg twice daily- starting on Saturday, then take it for 4 weeks after then stop.  *If you need a refill on your cardiac medications before your next appointment, please call your pharmacy*  Lab Work: Your provider would like for you to have following labs drawn today CBC, BMET.   If you have labs (blood work) drawn today and your tests are completely normal, you will receive your results only by: MyChart Message (if you have MyChart) OR A paper copy in the mail If you have any lab test that is abnormal or we need to change your treatment, we will call you to review the results.  Testing/Procedures: Your physician has recommended that you have a Cardioversion (DCCV). Electrical Cardioversion uses a jolt of electricity to your heart either through paddles or wired patches attached to your chest. This is a controlled, usually prescheduled, procedure. Defibrillation is done under light anesthesia in the hospital, and you usually go home the day of the procedure. This is done to get your heart back into a normal rhythm. You are not awake for the procedure. Please see the instruction sheet given to you today.    Follow-Up: At [facility], you and your health needs are our priority.  As part of our continuing mission to provide you with exceptional heart care, our providers are all part of one team.  This team includes your primary Cardiologist (physician) and Advanced Practice Providers or APPs (Physician Assistants and Nurse Practitioners) who all work together to provide you with the care you need, when you need it.  Your next appointment:   2 week(s)  Provider:   Suzann Riddle, NP or Dr.Lambert after Cardioversion    We recommend signing up for the patient portal called MyChart.  Sign up information is provided on this After Visit Summary.  MyChart is used to connect with patients for Virtual Visits (Telemedicine).  Patients are able to view  lab/test results, encounter notes, upcoming appointments, etc.  Non-urgent messages can be sent to your provider as well.   To learn more about what you can do with MyChart, go to [email or web address].   Other Instructions     Dear Amy Underwood  You are scheduled for a Cardioversion on Monday, July 14 with Dr. Darliss.  Please arrive at the [facility] Entrance of [facility], [address], [place] [address] at 6:30 AM (This is 1 hour(s) prior to your procedure time).  Proceed to the Check-In Desk directly inside the entrance.  Procedure Parking: Use the entrance off of the [address] side of the hospital. Turn right upon entering and follow the driveway to parking that is directly in front of the [facility]. There is no valet parking available at this entrance, however there is an awning directly in front of the [facility] for drop off/ pick up for patients.   DIET:  Nothing to eat or drink after midnight except a sip of water with medications (see medication instructions below)  MEDICATION INSTRUCTIONS: !!IF ANY NEW MEDICATIONS ARE STARTED AFTER TODAY, PLEASE NOTIFY YOUR PROVIDER AS SOON AS POSSIBLE!!  FYI: Medications such as Semaglutide (Ozempic, [place]), Tirzepatide (Mounjaro, Zepbound), Dulaglutide (Trulicity), etc (GLP1 agonists) AND Canagliflozin (Invokana), Dapagliflozin  (Farxiga ), Empagliflozin (Jardiance), Ertugliflozin (Steglatro), Bexagliflozin [employer]) or any combination with one of these drugs such as Invokamet (Canagliflozin/Metformin), Synjardy (Empagliflozin/Metformin), etc (SGLT2 inhibitors) must be held around the time of a procedure. This is not a  comprehensive list of all of these drugs. Please review all of your medications and talk to your provider if you take any one of these. If you are not sure, ask your provider.   HOLD: Dapagliflozin  (Farxiga ) for 3 days prior to the procedure. Last dose on Friday, July 11.      LABS: CBC, BMET today 03/12/2024  FYI:  For your safety, and to allow us  to monitor your vital signs accurately during the surgery/procedure we request: If you have artificial nails, gel coating, SNS etc, please have those removed prior to your surgery/procedure. Not having the nail coverings /polish removed may result in cancellation or delay of your surgery/procedure.  Your support person will be asked to wait in the waiting room during your procedure.  It is OK to have someone drop you off and come back when you are ready to be discharged.  You cannot drive after the procedure and will need someone to drive you home.  Bring your insurance cards.  *Special Note: Every effort is made to have your procedure done on time. Occasionally there are emergencies that occur at the hospital that may cause delays. Please be patient if a delay does occur.

## 2024-03-13 ENCOUNTER — Ambulatory Visit: Payer: Self-pay | Admitting: Cardiology

## 2024-03-13 LAB — BASIC METABOLIC PANEL WITH GFR
BUN/Creatinine Ratio: 30 — ABNORMAL HIGH (ref 12–28)
BUN: 38 mg/dL — ABNORMAL HIGH (ref 8–27)
CO2: 18 mmol/L — ABNORMAL LOW (ref 20–29)
Calcium: 9 mg/dL (ref 8.7–10.3)
Chloride: 110 mmol/L — ABNORMAL HIGH (ref 96–106)
Creatinine, Ser: 1.27 mg/dL — ABNORMAL HIGH (ref 0.57–1.00)
Glucose: 83 mg/dL (ref 70–99)
Potassium: 4.6 mmol/L (ref 3.5–5.2)
Sodium: 143 mmol/L (ref 134–144)
eGFR: 44 mL/min/1.73 — ABNORMAL LOW (ref >59)

## 2024-03-14 ENCOUNTER — Telehealth: Payer: Self-pay | Admitting: *Deleted

## 2024-03-14 DIAGNOSIS — I4819 Other persistent atrial fibrillation: Secondary | ICD-10-CM

## 2024-03-14 NOTE — Telephone Encounter (Signed)
 Called to confirm/remind patient of their procedure.   Scheduled for: DCCV  [x]  Date 03/17/24  [x]  Time 07:30 am [x]  Arrival time 06:30 am [x]  Location [facility]   [x]  Designated Driver  [employer], time, and location reviewed with patient  [] VM Full/unable to leave message  []  Phone not in service   [x]  H&P   [x]  EKG   [x]  Orders  [x]  Labs  [x]  GFR 44  [x]  Diet  [x]  Medication instructions Reviewed  [x]  Contrast Allergy   Patient verbalized understanding of all instructions with no further questions at this time.

## 2024-03-15 LAB — CBC
Hematocrit: 32.7 % — ABNORMAL LOW (ref 34.0–46.6)
Hemoglobin: 10.1 g/dL — ABNORMAL LOW (ref 11.1–15.9)
MCH: 25.8 pg — ABNORMAL LOW (ref 26.6–33.0)
MCHC: 30.9 g/dL — ABNORMAL LOW (ref 31.5–35.7)
MCV: 83 fL (ref 79–97)
Platelets: 220 x10E3/uL (ref 150–450)
RBC: 3.92 x10E6/uL (ref 3.77–5.28)
RDW: 16.2 % — ABNORMAL HIGH (ref 11.7–15.4)
WBC: 5.3 x10E3/uL (ref 3.4–10.8)

## 2024-03-17 ENCOUNTER — Ambulatory Visit
Admission: RE | Admit: 2024-03-17 | Discharge: 2024-03-17 | Disposition: A | Discharge status: Home / Self Care | Source: Ambulatory Visit | Attending: Cardiology | Admitting: Cardiology

## 2024-03-17 ENCOUNTER — Encounter
Admission: RE | Disposition: A | Discharge status: Home / Self Care | Payer: Self-pay | Source: Ambulatory Visit | Attending: Cardiology

## 2024-03-17 ENCOUNTER — Ambulatory Visit: Admitting: Anesthesiology

## 2024-03-17 ENCOUNTER — Encounter: Payer: Self-pay | Admitting: Cardiology

## 2024-03-17 ENCOUNTER — Encounter

## 2024-03-17 DIAGNOSIS — I739 Peripheral vascular disease, unspecified: Secondary | ICD-10-CM | POA: Insufficient documentation

## 2024-03-17 DIAGNOSIS — I5032 Chronic diastolic (congestive) heart failure: Secondary | ICD-10-CM | POA: Diagnosis not present

## 2024-03-17 DIAGNOSIS — I4892 Unspecified atrial flutter: Secondary | ICD-10-CM | POA: Diagnosis not present

## 2024-03-17 DIAGNOSIS — N184 Chronic kidney disease, stage 4 (severe): Secondary | ICD-10-CM | POA: Insufficient documentation

## 2024-03-17 DIAGNOSIS — Z87891 Personal history of nicotine dependence: Secondary | ICD-10-CM | POA: Insufficient documentation

## 2024-03-17 DIAGNOSIS — I4891 Unspecified atrial fibrillation: Secondary | ICD-10-CM | POA: Diagnosis present

## 2024-03-17 DIAGNOSIS — I480 Paroxysmal atrial fibrillation: Secondary | ICD-10-CM | POA: Insufficient documentation

## 2024-03-17 DIAGNOSIS — Z7901 Long term (current) use of anticoagulants: Secondary | ICD-10-CM | POA: Insufficient documentation

## 2024-03-17 DIAGNOSIS — J45909 Unspecified asthma, uncomplicated: Secondary | ICD-10-CM | POA: Diagnosis not present

## 2024-03-17 DIAGNOSIS — Z7902 Long term (current) use of antithrombotics/antiplatelets: Secondary | ICD-10-CM | POA: Insufficient documentation

## 2024-03-17 DIAGNOSIS — D649 Anemia, unspecified: Secondary | ICD-10-CM | POA: Diagnosis not present

## 2024-03-17 DIAGNOSIS — I130 Hypertensive heart and chronic kidney disease with heart failure and stage 1 through stage 4 chronic kidney disease, or unspecified chronic kidney disease: Secondary | ICD-10-CM | POA: Insufficient documentation

## 2024-03-17 DIAGNOSIS — I4819 Other persistent atrial fibrillation: Secondary | ICD-10-CM | POA: Diagnosis present

## 2024-03-17 HISTORY — PX: CARDIOVERSION: SHX1299

## 2024-03-17 SURGERY — CARDIOVERSION
Anesthesia: General

## 2024-03-17 MED ORDER — PROPOFOL 10 MG/ML IV BOLUS
INTRAVENOUS | Status: AC
  Filled 2024-03-17: qty 20

## 2024-03-17 MED ORDER — PROPOFOL 10 MG/ML IV BOLUS
INTRAVENOUS | Status: DC | PRN
  Administered 2024-03-17: 40 mg via INTRAVENOUS
  Administered 2024-03-17: 20 mg via INTRAVENOUS

## 2024-03-17 MED ORDER — SODIUM CHLORIDE 0.9 % IV SOLN: INTRAVENOUS | Status: DC

## 2024-03-17 NOTE — Transfer of Care (Signed)
 Immediate Anesthesia Transfer of Care Note  Patient: Amy Underwood  Procedure(s) Performed: CARDIOVERSION  Patient Location: Short Stay  Anesthesia Type:General  Level of Consciousness: awake, alert , and oriented  Airway & Oxygen Therapy: Patient Spontanous Breathing and Patient connected to nasal cannula oxygen  Post-op Assessment: Report given to RN and Post -op Vital signs reviewed and stable  Post vital signs: Reviewed and stable  Last Vitals:  Vitals Value Taken Time  BP 138/83 03/17/24 07:43  Temp    Pulse 70 03/17/24 07:45  Resp 20 03/17/24 07:45  SpO2 97 % 03/17/24 07:45    Last Pain:  Vitals:   03/17/24 0651  TempSrc: Oral  PainSc: 0-No pain         Complications: No notable events documented.

## 2024-03-17 NOTE — Anesthesia Preprocedure Evaluation (Signed)
 Anesthesia Evaluation  Patient identified by MRN, date of birth, ID band Patient awake    Reviewed: Allergy  & Precautions, NPO status , Patient's Chart, lab work & pertinent test results  History of Anesthesia Complications Negative for: history of anesthetic complications  Airway Mallampati: I   Neck ROM: Full    Dental  (+) Missing   Pulmonary asthma , former smoker   Pulmonary exam normal breath sounds clear to auscultation       Cardiovascular hypertension, + Peripheral Vascular Disease (SMA stenosis)  + dysrhythmias (a fib s/p Watchman on Plavix  and Eliquis )  Rhythm:Irregular Rate:Normal  Echo 09/06/20:  1. Normal left ventricular cavity size. Normal left ventricular systolic function. LV Ejection Fraction is approximately: 67 %.  2. Indeterminate diastolic dysfunction based on 7983 ASE guidelines.  3. There are no regional wall motion abnormalities.  4. Mild mitral valve regurgitation.  5. Mild aortic valve stenosis. The peak transaortic gradient is: 26.66 mmHg. The mean transaortic gradient is: 11.61 mmHg. The aortic valve area by the continuity equation (using VTI) is: 1.88 cm2.  6. The aortic root is normal in size. The ascending aorta is normal in size.     Neuro/Psych negative neurological ROS     GI/Hepatic ,GERD  ,,  Endo/Other  Obesity  Renal/GU Renal disease (stage IV CKD)     Musculoskeletal  (+) Arthritis ,    Abdominal   Peds  Hematology  (+) Blood dyscrasia, anemia   Anesthesia Other Findings Cardiology note 12/25/23:  Chart reviewed as part of pre-operative protocol coverage. Given past medical history and time since last visit, based on ACC/AHA guidelines, Noelie Strength would be at acceptable risk for the planned procedure without further cardiovascular testing.    Her Plavix  can be held 5 days before procedure but she should continue aspirin  81 mg daily.  Please resume Plavix  as soon as  hemostasis is achieved.   I will route this recommendation to the requesting party via Epic fax function and remove from pre-op pool.   Please call with questions.   Josefa HERO. Cleaver NP-C    Cardiology note 11/14/23:  #Persistent atrial fibrillation #High risk drug monitoring-amiodarone  #Watchman in situ   Stop amiodarone  today to see if her sinus rate picks up. Continue aspirin  and Plavix    Follow-up with EP APP in 3 months. Needs ECG at that visit   Reproductive/Obstetrics                              Anesthesia Physical Anesthesia Plan  ASA: 4  Anesthesia Plan: General   Post-op Pain Management:    Induction: Intravenous  PONV Risk Score and Plan: 3 and Propofol  infusion, TIVA and Treatment may vary due to age or medical condition  Airway Management Planned: Natural Airway  Additional Equipment:   Intra-op Plan:   Post-operative Plan:   Informed Consent: I have reviewed the patients History and Physical, chart, labs and discussed the procedure including the risks, benefits and alternatives for the proposed anesthesia with the patient or authorized representative who has indicated his/her understanding and acceptance.       Plan Discussed with: CRNA  Anesthesia Plan Comments: (LMA/GETA backup discussed.  Patient consented for risks of anesthesia including but not limited to:  - adverse reactions to medications - damage to eyes, teeth, lips or other oral mucosa - nerve damage due to positioning  - sore throat or hoarseness - damage to heart, brain,  nerves, lungs, other parts of body or loss of life  Informed patient about role of CRNA in peri- and intra-operative care.  Patient voiced understanding.)         Anesthesia Quick Evaluation

## 2024-03-17 NOTE — CV Procedure (Signed)
 Cardioversion note: A standard informed consent was obtained. Timeout was performed. The pads were placed in the anterior posterior fashion. The patient was given propofol  by the anesthesia team.  Successful cardioversion was performed with a 200 J. The patient converted to sinus rhythm with PACs.  Pre-and post EKGs were reviewed. The patient tolerated the procedure with no immediate complications.  Recommendations: Continue same medications and follow-up in 2-3 weeks.

## 2024-03-17 NOTE — Anesthesia Postprocedure Evaluation (Signed)
 Anesthesia Post Note  Patient: Amy Underwood  Procedure(s) Performed: CARDIOVERSION  Patient location during evaluation: PACU Anesthesia Type: General Level of consciousness: awake and alert, oriented and patient cooperative Pain management: pain level controlled Vital Signs Assessment: post-procedure vital signs reviewed and stable Respiratory status: spontaneous breathing, nonlabored ventilation and respiratory function stable Cardiovascular status: blood pressure returned to baseline and stable Postop Assessment: adequate PO intake Anesthetic complications: no   No notable events documented.   Last Vitals:  Vitals:   03/17/24 0815 03/17/24 0830  BP: 129/69   Pulse: 71 73  Resp: (!) 22 15  Temp:    SpO2: 100% 94%    Last Pain:  Vitals:   03/17/24 0830  TempSrc:   PainSc: 0-No pain                 [name]

## 2024-03-17 NOTE — Interval H&P Note (Signed)
 History and Physical Interval Note:  03/17/2024 7:55 AM  Amy Underwood  has presented today for surgery, with the diagnosis of Cardioversion   Afib.  The various methods of treatment have been discussed with the patient and family. After consideration of risks, benefits and other options for treatment, the patient has consented to  Procedure(s): CARDIOVERSION (N/A) as a surgical intervention.  The patient's history has been reviewed, patient examined, no change in status, stable for surgery.  I have reviewed the patient's chart and labs.  Questions were answered to the patient's satisfaction.     Nissa Stannard

## 2024-03-18 ENCOUNTER — Encounter: Payer: Self-pay | Admitting: Cardiology

## 2024-03-18 ENCOUNTER — Ambulatory Visit: Payer: Self-pay | Admitting: Cardiology

## 2024-03-18 DIAGNOSIS — I4819 Other persistent atrial fibrillation: Secondary | ICD-10-CM | POA: Diagnosis not present

## 2024-03-18 DIAGNOSIS — I498 Other specified cardiac arrhythmias: Secondary | ICD-10-CM | POA: Diagnosis not present

## 2024-03-19 ENCOUNTER — Ambulatory Visit (INDEPENDENT_AMBULATORY_CARE_PROVIDER_SITE_OTHER): Admitting: Primary Care

## 2024-03-19 ENCOUNTER — Encounter: Payer: Self-pay | Admitting: Primary Care

## 2024-03-19 VITALS — BP 148/82 | HR 73 | Temp 97.2°F | Ht 63.0 in | Wt 147.0 lb

## 2024-03-19 DIAGNOSIS — F4323 Adjustment disorder with mixed anxiety and depressed mood: Secondary | ICD-10-CM | POA: Diagnosis not present

## 2024-03-19 NOTE — Patient Instructions (Signed)
 Take your Zoloft  medication every day as discussed.  It was a pleasure to see you today!

## 2024-03-19 NOTE — Progress Notes (Signed)
 Subjective:    Patient ID: Amy Underwood, female    DOB: [birth year], 78 y.o.   MRN: [id number]  HPI  Amy Underwood is a very pleasant 78 y.o. female with a history of hypertension, atrial fibrillation, chronic mesenteric ischemia, aspiration pneumonia, anxiety and depression who presents today for follow-up of anxiety and depression.  She was last evaluated on 02/05/2024 for ED follow-up.  During this visit she endorsed feeling more depressed over the last few weeks which have been chronic over the last year.  Symptoms included little motivation to do things, worrying, feeling down/sad.  Given the symptoms she was initiated on Zoloft  25 mg daily.  She is here for follow-up today.  Since her last visit she is compliant Zoloft  every other day. She has noticed improvement in her symptoms feeling less sad/down, feeling less anxious, and has been more social with friends. Two days ago she underwent cardioversion which has caused increased symptoms.   She denies SI/HI, increased nausea, increased GI upset.   BP Readings from Last 3 Encounters:  03/19/24 (!) 148/82  03/17/24 129/69  03/12/24 (!) 142/72      Review of Systems  Respiratory:  Negative for shortness of breath.   Gastrointestinal:  Negative for abdominal pain.  Psychiatric/Behavioral:  Negative for sleep disturbance and suicidal ideas. The patient is nervous/anxious.          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 Cindie   Psoriasis    Unintentional weight loss 04/28/2023    Social History   Socioeconomic History   Marital status: Widowed    Spouse name: Not on file   Number of children: 2   Years of education: Not on file   Highest education level: GED or equivalent  Occupational History   Occupation: retired  Tobacco Use   Smoking status: Former    Current packs/day: 2.00    Average packs/day: 2.0 packs/day for 25.9 years (51.9 ttl pk-yrs)    Types: Cigarettes    Start date: 2001   Smokeless tobacco: Never  Vaping Use   Vaping status: Never Used  Substance and Sexual Activity   Alcohol use: Never   Drug use: Never   Sexual activity: Not Currently  Other Topics Concern   Not on file  Social History Narrative   Not on file   Social Drivers of Health   Financial Resource Strain: Low Risk  (02/15/2024)   Overall Financial Resource Strain (CARDIA)    Difficulty of Paying Living Expenses: Not hard at all  Food Insecurity: No Food Insecurity (02/15/2024)   Hunger Vital Sign    Worried About Running Out of Food in the Last Year: Never true    Ran Out of Food in the Last Year: Never true  Transportation Needs: No Transportation Needs (02/15/2024)   PRAPARE - Administrator, Civil Service (Medical): No    Lack of Transportation (Non-Medical): No  Physical Activity: Insufficiently Active (02/15/2024)   Exercise Vital Sign    Days of Exercise per Week: 1 day    Minutes of Exercise per Session: 30 min  Stress: Stress Concern Present (02/15/2024)   [employer] of Occupational Health - Occupational Stress Questionnaire    Feeling of Stress: To some extent  Social Connections: Moderately Isolated (02/15/2024)   Social Connection and Isolation Panel    Frequency of Communication with Friends and Family: Three times a week    Frequency of Social Gatherings with Friends and Family: More than three times a week    Attends Religious Services: 1 to 4 times per  year    Active Member of [employer] or Organizations: No    Attends Banker Meetings: Not on file    Marital Status: Widowed  Intimate Partner Violence: Not At Risk (05/02/2023)   Humiliation, Afraid, Rape, and Kick questionnaire    Fear of Current or Ex-Partner: No    Emotionally Abused: No    Physically Abused: No    Sexually Abused: No    Past Surgical History:  Procedure Laterality Date   ATRIAL FIBRILLATION ABLATION N/A 01/03/2022   Procedure: ATRIAL FIBRILLATION ABLATION;  Surgeon: Cindie Ole DASEN, MD;  Location: [facility];  Service: Cardiovascular;  Laterality: N/A;   CARDIOVERSION N/A 03/17/2024   Procedure: CARDIOVERSION;  Surgeon: Darliss Rogue, MD;  Location: [facility];  Service: Cardiovascular;  Laterality: N/A;   CARPAL TUNNEL RELEASE Right 01/18/2023   Procedure: CARPAL TUNNEL RELEASE;  Surgeon: Dozier Soulier, MD;  Location: [facility];  Service: Orthopedics;  Laterality: Right;   CESAREAN SECTION     3   CHOLECYSTECTOMY     ESOPHAGOGASTRODUODENOSCOPY N/A 01/16/2024   Procedure: EGD (ESOPHAGOGASTRODUODENOSCOPY);  Surgeon: Therisa Bi, MD;  Location: [facility];  Service: Gastroenterology;  Laterality: N/A;   HIP ARTHROPLASTY Left    KNEE ARTHROPLASTY Bilateral    LEFT ATRIAL APPENDAGE OCCLUSION N/A 04/20/2022   Procedure: LEFT ATRIAL APPENDAGE OCCLUSION;  Surgeon: Cindie Ole DASEN, MD;  Location: [facility];  Service: Cardiovascular;  Laterality: N/A;   PERIPHERAL VASCULAR INTERVENTION  07/18/2023   Procedure: PERIPHERAL VASCULAR INTERVENTION;  Surgeon: Darron Deatrice LABOR, MD;  Location: [facility];  Service: Cardiovascular;;   REVERSE SHOULDER ARTHROPLASTY Right 01/18/2023   Procedure: REVERSE SHOULDER ARTHROPLASTY;  Surgeon: Dozier Soulier, MD;  Location: [facility];  Service: Orthopedics;  Laterality: Right;   TEE WITHOUT CARDIOVERSION N/A 04/20/2022   Procedure: TRANSESOPHAGEAL ECHOCARDIOGRAM (TEE);  Surgeon: Cindie Ole DASEN, MD;   [facility] IN[facility] CV [facility];  Service: Cardiovascular;  Laterality: N/A;   VISCERAL ANGIOGRAPHY N/A 07/18/2023   Procedure: MESENTRIC  ANGIOGRAPHY;  Surgeon: Darron Deatrice LABOR, MD;  Location: [facility];  Service: Cardiovascular;  Laterality: N/A;    Family History  Problem Relation Age of Onset   Heart disease Mother    Breast cancer Neg Hx    Colon cancer Neg Hx    Esophageal cancer Neg Hx    Inflammatory bowel disease Neg Hx    Liver disease Neg Hx    Pancreatic cancer Neg Hx    Rectal cancer Neg Hx    Stomach cancer Neg Hx     Allergies  Allergen Reactions   Alendronate  Palpitations   Cefaclor Hives and Rash    Current Outpatient Medications on File Prior to Visit  Medication Sig Dispense Refill   albuterol  (PROVENTIL ) (2.5 MG/3ML) 0.083% nebulizer solution Take 3 mLs (2.5 mg total)  by nebulization every 6 (six) hours as needed for wheezing or shortness of breath. 150 mL 0   allopurinol  (ZYLOPRIM ) 100 MG tablet Take 100 mg by mouth at bedtime.     amLODipine  (NORVASC ) 10 MG tablet Take 1 tablet (10 mg total) by mouth daily. 180 tablet 3   apixaban  (ELIQUIS ) 5 MG TABS tablet Take 1 tablet (5 mg total) by mouth 2 (two) times daily. Take 1 tablet twice daily for 31 days. 61 tablet 0   Apremilast  (OTEZLA ) 30 MG TABS Take 1 tablet (30 mg total) by mouth 2 (two) times daily. 180 tablet 3   aspirin  EC 81 MG tablet Take 1 tablet (81 mg total) by mouth daily. Swallow whole.     atorvastatin  (LIPITOR) 20 MG tablet TAKE 1 TABLET BY MOUTH DAILY FOR CHOLESTEROL 90 tablet 2   Calcium -Magnesium -Vitamin D (CALCIUM  1200+D3 PO) Take 1 tablet by mouth daily.     Cholecalciferol  (DIALYVITE VITAMIN D 5000) 125 MCG (5000 UT) capsule Take 5,000 Units by mouth daily.     Cyanocobalamin  5000 MCG TBDP Take 5,000 mcg by mouth daily. Vit B12     dexlansoprazole  (DEXILANT ) 60 MG capsule Take 1 capsule (60 mg total) by mouth daily. 30 capsule 11   DULoxetine  (CYMBALTA ) 60 MG capsule Take 60 mg  by mouth daily.     FARXIGA  10 MG TABS tablet Take 10 mg by mouth every morning.     furosemide  (LASIX ) 20 MG tablet TAKE 1 TABLET(20 MG) BY MOUTH DAILY AS NEEDED 90 tablet 3   ibandronate  (BONIVA ) 150 MG tablet Take 1 tablet (150 mg total) by mouth every 30 (thirty) days. Take in the morning with a full glass of water, on an empty stomach, and do not take anything else by mouth or lie down for the next 30 min. 3 tablet 3   losartan  (COZAAR ) 100 MG tablet TAKE 1 TABLET BY MOUTH DAILY FOR BLOOD PRESSURE 90 tablet 2   magnesium  oxide (MAG-OX) 400 (240 Mg) MG tablet TAKE 1 TABLET(400 MG) BY MOUTH DAILY 90 tablet 3   omeprazole  (PRILOSEC) 20 MG capsule Take 20 mg by mouth daily.     ondansetron  (ZOFRAN -ODT) 4 MG disintegrating tablet Take 1 tablet (4 mg total) by mouth every 8 (eight) hours as needed for nausea or vomiting. 30 tablet 0   sertraline  (ZOLOFT ) 25 MG tablet Take 1 tablet (25 mg total) by mouth daily. for anxiety and depression. 90 tablet 0   triamcinolone  cream (KENALOG ) 0.1 % Apply 1 Application topically 2 (two) times daily as needed. 80 g 3   No current facility-administered medications on file prior to visit.    BP (!) 148/82   Pulse 73   Temp (!) 97.2 F (36.2 C) (Temporal)   Ht 5' 3 (1.6 m)   Wt 147 lb (66.7 kg)   SpO2 100%   BMI 26.04 kg/m  Objective:   Physical Exam Cardiovascular:     Rate and Rhythm: Normal rate and regular rhythm.  Pulmonary:     Effort: Pulmonary effort is normal.     Breath sounds: Normal breath sounds.  Musculoskeletal:     Cervical back: Neck supple.  Skin:    General: Skin is warm and dry.  Neurological:     Mental Status: She is alert and oriented to person, place, and time.  Psychiatric:        Mood and Affect: Mood normal.           Assessment & Plan:  Adjustment reaction with anxiety and depression Assessment & Plan: Improving!  We discussed that Zoloft  is meant to be taken daily and to start taking her 25 mg tablets  daily.  We will continue at the 25 mg dose for now.  Consider dose adjustment if needed.         Tanayah Squitieri K Lamon Rotundo, NP

## 2024-03-19 NOTE — Assessment & Plan Note (Signed)
 Improving!  We discussed that Zoloft  is meant to be taken daily and to start taking her 25 mg tablets daily.  We will continue at the 25 mg dose for now.  Consider dose adjustment if needed.

## 2024-03-24 ENCOUNTER — Ambulatory Visit: Admitting: Dermatology

## 2024-03-24 ENCOUNTER — Telehealth: Payer: Self-pay | Admitting: Cardiovascular Disease

## 2024-03-24 DIAGNOSIS — L720 Epidermal cyst: Secondary | ICD-10-CM

## 2024-03-24 DIAGNOSIS — L729 Follicular cyst of the skin and subcutaneous tissue, unspecified: Secondary | ICD-10-CM

## 2024-03-24 DIAGNOSIS — D492 Neoplasm of unspecified behavior of bone, soft tissue, and skin: Secondary | ICD-10-CM

## 2024-03-24 NOTE — Telephone Encounter (Signed)
 Pt had cardioversion on 7/14. She said she signed paper to give consent for Dr Budd to do procedure. But pt said Dr Darron did the surgery and no one informed her. She said she told his PA last time she was in the office that she did not  want to see Dr Darron anymore. So pt is upset that he did her cardioversion instead of Dr Budd.

## 2024-03-24 NOTE — Progress Notes (Unsigned)
 Follow-Up Visit   Subjective  Amy Underwood is a 78 y.o. female who presents for the following: Cysts at back, excise today.    The following portions of the chart were reviewed this encounter and updated as appropriate: medications, allergies, medical history  Review of Systems:  No other skin or systemic complaints except as noted in HPI or Assessment and Plan.  Objective  Well appearing patient in no apparent distress; mood and affect are within normal limits.  A focused examination was performed of the following areas: Back  Relevant physical exam findings are noted in the Assessment and Plan.  Right Upper Back SQ nodule 1.1cm spinal upper back 0.5cm blue SQ nodule  Assessment & Plan   NEOPLASM OF SKIN (2) Right Upper Back Skin excision  Lesion length (cm):  1.1 Lesion width (cm):  1.1 Margin per side (cm):  0.1 Total excision diameter (cm):  1.3 Informed consent: discussed and consent obtained   Timeout: patient name, date of birth, surgical site, and procedure verified   Procedure prep:  Patient was prepped and draped in usual sterile fashion Prep type:  Povidone-iodine  Anesthesia: the lesion was anesthetized in a standard fashion   Anesthetic:  1% lidocaine  w/ epinephrine  1-100,000 buffered w/ 8.4% NaHCO3 (6cc lido w/ epi, 3cc bupivicaine, Total of 9cc) Instrument used: #15 blade   Hemostasis achieved with: pressure and electrodesiccation   Outcome: patient tolerated procedure well with no complications    Skin repair Complexity:  Intermediate Final length (cm):  1.5 Informed consent: discussed and consent obtained   Timeout: patient name, date of birth, surgical site, and procedure verified   Reason for type of repair: reduce tension to allow closure, reduce the risk of dehiscence, infection, and necrosis, reduce subcutaneous dead space and avoid a hematoma, preserve normal anatomical and functional relationships and enhance both functionality and cosmetic  results   Undermining: edges undermined   Subcutaneous layers (deep stitches):  Suture size:  3-0 Suture type: Vicryl (polyglactin 910)   Subcutaneous suture technique: inverted dermal. Fine/surface layer approximation (top stitches):  Suture size:  4-0 Suture type: nylon   Stitches: simple interrupted   Suture removal (days):  7 Hemostasis achieved with: suture Outcome: patient tolerated procedure well with no complications   Post-procedure details: sterile dressing applied and wound care instructions given   Dressing type: pressure dressing (Mupirocin ointment)    Specimen 1 - Surgical pathology Differential Diagnosis: Cyst vs other  Check Margins: No Cystic pap spinal upper back Skin excision  Excision method:  punch Lesion length (cm):  0.5 Lesion width (cm):  0.5 Margin per side (cm):  0 Total excision diameter (cm):  0.5 Informed consent: discussed and consent obtained   Timeout: patient name, date of birth, surgical site, and procedure verified   Procedure prep:  Patient was prepped and draped in usual sterile fashion Prep type:  Povidone-iodine  Anesthesia: the lesion was anesthetized in a standard fashion   Anesthetic:  1% lidocaine  w/ epinephrine  1-100,000 buffered w/ 8.4% NaHCO3 Instrument used comment:  5 mm punch Hemostasis achieved with: suture and pressure   Outcome: patient tolerated procedure well with no complications   Post-procedure details: sterile dressing applied and wound care instructions given   Dressing type: petrolatum and pressure dressing    Specimen 2 - Surgical pathology Differential Diagnosis: Cyst vs other  Check Margins: No 0.5cm cystic pap   Return in about 1 week (around 03/31/2024) for suture removal.  I, Andrea Kerns, CMA, am acting as scribe  for Rexene Rattler, MD .   Documentation: I have reviewed the above documentation for accuracy and completeness, and I agree with the above.  Rexene Rattler, MD

## 2024-03-24 NOTE — Patient Instructions (Signed)

## 2024-03-24 NOTE — Telephone Encounter (Addendum)
 Returned the call to the patient. She was calling to see why Dr. Darron did the cardioversion and not Dr. Darliss. She has been advised that they are both out right now and we will return her call when they are back. She does have a follow up on 03/28/24.

## 2024-03-25 ENCOUNTER — Telehealth: Payer: Self-pay

## 2024-03-25 NOTE — Telephone Encounter (Signed)
Talked to patient and she is doing fine from yesterday's surgery.  ?

## 2024-03-26 LAB — SURGICAL PATHOLOGY

## 2024-03-27 DIAGNOSIS — I4892 Unspecified atrial flutter: Secondary | ICD-10-CM | POA: Insufficient documentation

## 2024-03-27 NOTE — Progress Notes (Unsigned)
 Cardiology Office Note Date:  03/28/2024  Patient ID:  Amy Underwood 10-23-1945, MRN [id number] PCP:  Gretta Comer POUR, NP  Cardiologist:  None Electrophysiologist: OLE ONEIDA HOLTS, MD   Chief Complaint: DCCV follow-up  History of Present Illness: Amy Underwood is a 78 y.o. female with PMH notable for persis Afib, s/p LAAO with watchman, HFpEF, chronic mesenteric ischemia s/p recent stent, HTN; seen today for OLE ONEIDA HOLTS, MD for follow-up after recent DCCV.   She is s/p atypical clockwise flutter ablation w CTI, AF ablation with PVI, Posterior wall on 01/2022.  She is s/p LAAO with watchman 04/2022. Tikosyn  stopped 11/2022 d/t no AFib. She last saw Dr. HOLTS 02/2023 was doing well off tikosyn .  She has since had reoccurrence of AFib. She was not interested in reloading of tikosyn  and so amiodarone  was started.   I saw her 09/2023 where here EKG was concerning for junctional rhythm, and amio was reduced. It has since been stopped for ongoing concerns of junctional rhythm.   I saw her 02/2024 where she appeared to be in SVT, was concerned it was due to PNA and albuterol  use. She overall felt well and did not feel as though she was in afib/flutter. Updated zio showed 100% AFib/flutter burden and she is now s/p DCCV  On follow-up today, she is very concerned and frustrated that she is back in AFib. She wears an apple watch that has notified her of AFib, she also has palpitations that start in her neck, similar to her AFib symptoms in the past.   She continues to take eliquis  BID, no bleeding concerns.    AAD History: Tikosyn  - stopped 11/2022 d/t no AFib Amiodarone  - stopped 11/2023 d/t junctional    ROS:  Please see the history of present illness. All other systems are reviewed and otherwise negative.   PHYSICAL EXAM:  VS:  BP (!) 142/78 (BP Location: Left Arm, Patient Position: Sitting, Cuff Size: Normal)   Pulse 70   Ht 5' 3 (1.6 m)   Wt 151 lb 3.2 oz (68.6 kg)    SpO2 97%   BMI 26.78 kg/m  BMI: Body mass index is 26.78 kg/m.  Wt Readings from Last 3 Encounters:  03/28/24 151 lb 3.2 oz (68.6 kg)  03/19/24 147 lb (66.7 kg)  03/17/24 146 lb (66.2 kg)      GEN- The patient is well appearing, alert and oriented x 3 today.   Lungs- Clear to ausculation bilaterally, normal work of breathing.  Heart- Irregularly irregular, tachycardic rate and rhythm, no murmurs, rubs or gallops Extremities- No peripheral edema, warm, dry   EKG is ordered. Personal review of EKG from today shows:   EKG Interpretation Date/Time:  Friday March 28 2024 10:15:41 EDT Ventricular Rate:  70 PR Interval:    QRS Duration:  70 QT Interval:  396 QTC Calculation: 427 R Axis:   -58  Text Interpretation: sinus rhythm with premature atrial contraction Left axis deviation Confirmed by Nathasha Fiorillo [phone]) on 03/28/2024 10:25:22 AM     Rhythm strip from today - regular R-R intervals with occasional PAC, highly suggestive of sinus rhythm    Additional studies reviewed include: Previous EP, cardiology notes.   Long term monitor, 03/10/2024 (unofficial results) 2 Ventricular Tachycardia runs occurred, the run with the fastest interval lasting 4 beats with a max rate of 185 bpm, the longest lasting 4 beats with an avg rate of 140 bpm. Atrial Flutter occurred continuously (100% burden), ranging from 51-122 bpm  (  avg of 97 bpm). Isolated VEs were occasional (4.1%, 16447), VE Couplets were rare (<1.0%, 17), and VE Triplets were rare (<1.0%, 2). Ventricular Bigeminy and Trigeminy were present.   TTE, 10/09/2023 1. Left ventricular ejection fraction, by estimation, is 55 to 60%. The left ventricle has normal function. The left ventricle has no regional wall motion abnormalities. Left ventricular diastolic parameters are indeterminate. The average left ventricular global longitudinal strain is -18.5 %. The global longitudinal strain is normal.   2. Right ventricular systolic function is  normal. The right ventricular size is normal. There is mildly elevated pulmonary artery systolic pressure. The estimated right ventricular systolic pressure is 40.8 mmHg.  3. The mitral valve is normal in structure. Moderate mitral valve regurgitation. No evidence of mitral stenosis.   4. Tricuspid valve regurgitation is moderate.   5. The aortic valve is calcified. There is moderate calcification of the aortic valve. Aortic valve regurgitation is mild. Aortic valve sclerosis is present, with no evidence of aortic valve stenosis. Aortic valve mean gradient measures 7.0 mmHg.   6. The inferior vena cava is normal in size with greater than 50% respiratory variability, suggesting right atrial pressure of 3 mmHg.   Long term monitor, 05/28/2023 HR 51 - 245, average 94 bpm. 80% burden of AF/AFL, average 99 bpm. 37 SVT, longest 13.2 seconds with an average rate of 128 bpm. AF/AFL present at the deactivation of device. Rare supraventricular and ventricular ectopy.  CT cardiac, 06/22/2022 1. Well placed 31 mm Watchman FLX with no leak, full endothelialization and average compression 18%  2.  Moderate bi atrial enlargement  3. 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  Normal ascending thoracic aorta 3.5 cm  5.  No perciardial effusion  6.  Normal PV anatomy measurements above  TTE, 10/27/2021  1. Left ventricular ejection fraction, by estimation, is 60 to 65%. Left ventricular ejection fraction by 2D MOD biplane is 63.8 %. The left ventricle has normal function. The left ventricle has no regional wall motion abnormalities. There is moderate left ventricular hypertrophy of the basal-septal segment. Left ventricular diastolic parameters are consistent with Grade II diastolic dysfunction (pseudonormalization). The average left ventricular global longitudinal strain is -17.8 %. The global longitudinal strain is normal.   2. Right ventricular systolic function is normal. The  right ventricular size is normal. There is normal pulmonary artery systolic pressure.   3. Left atrial size was mildly dilated.   4. The mitral valve is degenerative. Mild mitral valve regurgitation.   5. The aortic valve is calcified. Aortic valve regurgitation is mild. Mild aortic valve stenosis. Aortic valve mean gradient measures 8.0 mmHg.   6. The inferior vena cava is normal in size with greater than 50% respiratory variability, suggesting right atrial pressure of 3 mmHg.    ASSESSMENT AND PLAN:  #) persis AFib  #) aflutter S/p AF, Aflutter ablation 01/2022 Amio stopped d/t junctional rhythm Recent zio with 100% AFib/flutter burden and patient is s/p DCCV EKG and rhythm strip today highly suggestive of sinus rhythm with PAC, though patient has palpitations and feels like she is in AFib.  Update 1 week monitor Restart amiodarone  at 100mg  daily We discussed redo AF ablation, and patient is agreeable to proceeding. Final decision regarding procedure by MD   #) LAAO with Watchman Watchman well-seated continue eliquis  for 4 weeks post-DCCV       Current medicines are reviewed at length with the patient today.   The patient has  concerns regarding her medicines.  The following changes were made today:   CONTINUE ELIQUIS  until 8/11 START amiodarone  100mg  daily   Labs/ tests ordered today include:  Orders Placed This Encounter  Procedures   LONG TERM MONITOR (3-14 DAYS)   EKG 12-Lead   EKG 12-Lead     Disposition: Follow up with Dr. Cindie  in 4-6 weeks  to discuss AF ablation   Signed, Chantal Needle, NP  03/28/24  4:24 PM  Electrophysiology [facility]

## 2024-03-28 ENCOUNTER — Ambulatory Visit

## 2024-03-28 ENCOUNTER — Ambulatory Visit: Attending: Cardiology | Admitting: Cardiology

## 2024-03-28 VITALS — BP 142/78 | HR 70 | Ht 63.0 in | Wt 151.2 lb

## 2024-03-28 DIAGNOSIS — I4892 Unspecified atrial flutter: Secondary | ICD-10-CM | POA: Diagnosis not present

## 2024-03-28 DIAGNOSIS — Z95818 Presence of other cardiac implants and grafts: Secondary | ICD-10-CM

## 2024-03-28 DIAGNOSIS — I4819 Other persistent atrial fibrillation: Secondary | ICD-10-CM | POA: Diagnosis not present

## 2024-03-28 DIAGNOSIS — D6869 Other thrombophilia: Secondary | ICD-10-CM | POA: Diagnosis not present

## 2024-03-28 MED ORDER — AMIODARONE HCL 100 MG PO TABS: 100.0000 mg | ORAL_TABLET | Freq: Every day | ORAL | 1 refills | Status: AC

## 2024-03-28 NOTE — Patient Instructions (Signed)
 Medication Instructions:  Restart Amiodarone  100 mg daily   *If you need a refill on your cardiac medications before your next appointment, please call your pharmacy*  Testing/Procedures: ZIO XT- Long Term Monitor Instructions  Your physician has requested you wear a ZIO patch monitor for 7 days.  This is a single patch monitor. [employer] supplies one patch monitor per enrollment. Additional stickers are not available. Please do not apply patch if you will be having a Nuclear Stress Test,  Echocardiogram, Cardiac CT, MRI, or Chest Xray during the period you would be wearing the  monitor. The patch cannot be worn during these tests. You cannot remove and re-apply the  ZIO XT patch monitor.  Your ZIO patch monitor will be mailed 3 day USPS to your address on file. It may take 3-5 days  to receive your monitor after you have been enrolled.  Once you have received your monitor, please review the enclosed instructions. Your monitor  has already been registered assigning a specific monitor serial # to you.  Billing and Patient Assistance Program Information  We have supplied [employer] with any of your insurance information on file for billing purposes. [employer] offers a sliding scale Patient Assistance Program for patients that do not have  insurance, or whose insurance does not completely cover the cost of the ZIO monitor.  You must apply for the Patient Assistance Program to qualify for this discounted rate.  To apply, please call [employer] at [phone], select option 4, select option 2, ask to apply for  Patient Assistance Program. Meredeth will ask your household income, and how many people  are in your household. They will quote your out-of-pocket cost based on that information.  [employer] will also be able to set up a 20-month, interest-free payment plan if needed.  Applying the monitor   Shave hair from upper left chest.  Hold abrader disc by orange tab. Rub abrader in 40 strokes over the  upper left chest as  indicated in your monitor instructions.  Clean area with 4 enclosed alcohol pads. Let dry.  Apply patch as indicated in monitor instructions. Patch will be placed under collarbone on left  side of chest with arrow pointing upward.  Rub patch adhesive wings for 2 minutes. Remove white label marked 1. Remove the white  label marked 2. Rub patch adhesive wings for 2 additional minutes.  While looking in a mirror, press and release button in center of patch. A small green light will  flash 3-4 times. This will be your only indicator that the monitor has been turned on.  Do not shower for the first 24 hours. You may shower after the first 24 hours.  Press the button if you feel a symptom. You will hear a small click. Record Date, Time and  Symptom in the Patient Logbook.  When you are ready to remove the patch, follow instructions on the last 2 pages of Patient  Logbook. Stick patch monitor onto the last page of Patient Logbook.  Place Patient Logbook in the blue and white box. Use locking tab on box and tape box closed  securely. The blue and white box has prepaid postage on it. Please place it in the mailbox as  soon as possible. Your physician should have your test results approximately 7 days after the  monitor has been mailed back to [facility].  Call [facility] Customer Care at [phone] if you have questions regarding  your ZIO XT patch monitor. Call them immediately if you  see an orange light blinking on your  monitor.  If your monitor falls off in less than 4 days, contact our Monitor department at [phone].  If your monitor becomes loose or falls off after 4 days call [employer] at [phone] for  suggestions on securing your monitor   Follow-Up: At [facility], you and your health needs are our priority.  As part of our continuing mission to provide you with exceptional heart care, our providers are all part of one team.  This team  includes your primary Cardiologist (physician) and Advanced Practice Providers or APPs (Physician Assistants and Nurse Practitioners) who all work together to provide you with the care you need, when you need it.  Your next appointment:   September 3rd at 9:20 AM with Dr.Lambert.   We recommend signing up for the patient portal called MyChart.  Sign up information is provided on this After Visit Summary.  MyChart is used to connect with patients for Virtual Visits (Telemedicine).  Patients are able to view lab/test results, encounter notes, upcoming appointments, etc.  Non-urgent messages can be sent to your provider as well.   To learn more about what you can do with MyChart, go to [email or web address].

## 2024-03-31 ENCOUNTER — Ambulatory Visit: Payer: Self-pay | Admitting: Dermatology

## 2024-03-31 ENCOUNTER — Telehealth: Payer: Self-pay

## 2024-03-31 MED ORDER — OTEZLA 30 MG PO TABS: 30.0000 mg | ORAL_TABLET | Freq: Two times a day (BID) | ORAL | 5 refills | Status: DC

## 2024-03-31 NOTE — Telephone Encounter (Signed)
 Spoke with patient this morning regarding Otezla  RX. The prescription was not sent in during her office visit with Dr. Hester, sent today.  She is coming in tomorrow for suture removal and advised patient we can give another sample pack at that time. aw

## 2024-04-01 ENCOUNTER — Ambulatory Visit (INDEPENDENT_AMBULATORY_CARE_PROVIDER_SITE_OTHER): Admitting: Dermatology

## 2024-04-01 DIAGNOSIS — L720 Epidermal cyst: Secondary | ICD-10-CM

## 2024-04-01 NOTE — Progress Notes (Signed)
   Follow-Up Visit   Subjective  Amy Underwood is a 78 y.o. female who presents for the following: Suture removal  Pathology showed epidermal inclusion cyst at the right upper back and spinal upper back.  The following portions of the chart were reviewed this encounter and updated as appropriate: medications, allergies, medical history  Review of Systems:  No other skin or systemic complaints except as noted in HPI or Assessment and Plan.  Objective  Well appearing patient in no apparent distress; mood and affect are within normal limits.  Areas Examined: Back  Relevant physical exam findings are noted in the Assessment and Plan.    Assessment & Plan    Encounter for Removal of Sutures - Incision site is clean, dry and intact. - Wound cleansed, sutures removed, wound cleansed and steri strips applied.  - Discussed pathology results showing epidermal inclusion cyst at the right upper back and spinal upper back - Patient advised to keep steri-strips dry until they fall off. - Scars remodel for a full year. - Once steri-strips fall off, patient can apply over-the-counter silicone scar cream once to twice a day to help with scar remodeling if desired. - Patient advised to call with any concerns or if they notice any new or changing lesions.  Return as scheduled, for Psoriasis.  IAndrea Kerns, CMA, am acting as scribe for Rexene Rattler, MD .   Documentation: I have reviewed the above documentation for accuracy and completeness, and I agree with the above.  Rexene Rattler, MD

## 2024-04-01 NOTE — Patient Instructions (Signed)

## 2024-04-03 NOTE — Telephone Encounter (Signed)
 Called the patient on 04/03/24 to extend an apology. Ensured her the situation was being investigated and her concerns were taken seriously.   Ok Molt

## 2024-04-18 DIAGNOSIS — I4892 Unspecified atrial flutter: Secondary | ICD-10-CM | POA: Diagnosis not present

## 2024-04-18 DIAGNOSIS — D6869 Other thrombophilia: Secondary | ICD-10-CM | POA: Diagnosis not present

## 2024-04-18 DIAGNOSIS — Z95818 Presence of other cardiac implants and grafts: Secondary | ICD-10-CM

## 2024-04-25 ENCOUNTER — Ambulatory Visit
Admission: EM | Admit: 2024-04-25 | Discharge: 2024-04-25 | Disposition: A | Discharge status: Home / Self Care | Attending: Emergency Medicine | Admitting: Emergency Medicine

## 2024-04-25 ENCOUNTER — Ambulatory Visit: Payer: Self-pay | Admitting: Cardiology

## 2024-04-25 DIAGNOSIS — J029 Acute pharyngitis, unspecified: Secondary | ICD-10-CM | POA: Diagnosis not present

## 2024-04-25 LAB — POC SOFIA SARS ANTIGEN FIA: SARS Coronavirus 2 Ag: NEGATIVE

## 2024-04-25 LAB — POCT RAPID STREP A (OFFICE): Rapid Strep A Screen: NEGATIVE

## 2024-04-25 NOTE — ED Provider Notes (Signed)
 [facility]    CSN: [id number] Arrival date & time: 04/25/24  1312      History   Chief Complaint Chief Complaint  Patient presents with   Otalgia   Sore Throat    HPI Amy Underwood is a 78 y.o. female.  Patient presents with sore throat and left ear pain since last night.  She has been treating her symptoms with Tylenol .  No fever, ear drainage, cough, shortness of breath, vomiting, diarrhea.  The history is provided by the patient and medical records.    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   Dilated bile duct 04/27/2023   Bilateral carpal tunnel syndrome 12/20/2022   Paresthesia of both hands 09/21/2022   Arthritis of both glenohumeral joints 06/01/2022   Presence of Watchman left atrial appendage closure device  04/20/2022   Persistent atrial fibrillation (HCC) 04/20/2022   Secondary hypercoagulable state (HCC) 01/31/2022   Chronic gout 12/14/2021   Varicose veins of leg with pain, bilateral 08/16/2021   Trigger finger of left hand 07/18/2021   Anemia in chronic kidney disease 07/11/2021   Chronic kidney disease, stage IV (severe) (HCC) 07/11/2021   Mixed hyperlipidemia 07/05/2020   Chronic anticoagulation 10/02/2019   History of revision of total replacement of right hip joint 07/28/2019   Paroxysmal atrial fibrillation (HCC) 01/08/2019   Heart murmur 12/18/2016   Asthma 09/23/2014   Essential hypertension 09/23/2014   Gastroesophageal reflux disease 09/23/2014   Pernicious anemia 09/23/2014   Psoriasis 09/23/2014   Hip hematoma, left 03/02/2014   IDA (iron deficiency anemia) 07/09/2013    Past Surgical History:  Procedure Laterality Date   ATRIAL FIBRILLATION ABLATION N/A 01/03/2022   Procedure: ATRIAL FIBRILLATION ABLATION;  Surgeon: Cindie Ole DASEN, MD;  Location: [facility];  Service: Cardiovascular;  Laterality: N/A;   CARDIOVERSION N/A 03/17/2024   Procedure: CARDIOVERSION;  Surgeon: Darliss Rogue, MD;  Location: [facility] ORS;  Service: Cardiovascular;  Laterality: N/A;   CARPAL TUNNEL RELEASE Right 01/18/2023   Procedure: CARPAL TUNNEL RELEASE;  Surgeon: Dozier Soulier, MD;  Location: [facility] ORS;  Service: Orthopedics;  Laterality: Right;   CESAREAN SECTION     3   CHOLECYSTECTOMY     ESOPHAGOGASTRODUODENOSCOPY N/A 01/16/2024   Procedure: EGD (ESOPHAGOGASTRODUODENOSCOPY);  Surgeon: Therisa Bi, MD;  Location: [facility] ENDOSCOPY;  Service: Gastroenterology;  Laterality: N/A;   HIP ARTHROPLASTY Left    KNEE ARTHROPLASTY Bilateral    LEFT ATRIAL APPENDAGE OCCLUSION N/A 04/20/2022   Procedure: LEFT ATRIAL APPENDAGE OCCLUSION;  Surgeon: Cindie Ole DASEN, MD;  Location: [facility];  Service: Cardiovascular;  Laterality: N/A;   PERIPHERAL VASCULAR INTERVENTION  07/18/2023    Procedure: PERIPHERAL VASCULAR INTERVENTION;  Surgeon: Darron Deatrice LABOR, MD;  Location: [facility];  Service: Cardiovascular;;   REVERSE SHOULDER ARTHROPLASTY Right 01/18/2023   Procedure: REVERSE SHOULDER ARTHROPLASTY;  Surgeon: Dozier Soulier, MD;  Location: [facility] ORS;  Service: Orthopedics;  Laterality: Right;   TEE WITHOUT CARDIOVERSION N/A 04/20/2022   Procedure: TRANSESOPHAGEAL ECHOCARDIOGRAM (TEE);  Surgeon: Cindie Ole DASEN, MD;  Location: [facility] INVASIVE CV LAB;  Service: Cardiovascular;  Laterality: N/A;   VISCERAL ANGIOGRAPHY N/A 07/18/2023   Procedure: MESENTRIC  ANGIOGRAPHY;  Surgeon: Darron Deatrice LABOR, MD;  Location: [facility];  Service: Cardiovascular;  Laterality: N/A;    OB History   No obstetric history on file.      Home Medications    Prior to Admission medications   Medication Sig Start Date End Date Taking? Authorizing Provider  allopurinol  (ZYLOPRIM ) 100 MG tablet Take 100 mg by mouth at bedtime. 12/21/21  Yes [provider]  amiodarone  (PACERONE ) 100 MG tablet Take 1 tablet (100 mg total) by mouth daily. 03/28/24  Yes Riddle, Suzann, NP  amLODipine  (NORVASC ) 10 MG tablet Take 1 tablet (10 mg total) by mouth daily. 01/10/22  Yes Lesia Ozell Barter, PA-C  Apremilast  (OTEZLA ) 30 MG TABS Take 1 tablet (30 mg total) by mouth 2 (two) times daily. 03/31/24  Yes Hester Alm BROCKS, MD  aspirin  EC 81 MG tablet Take 1 tablet (81 mg total) by mouth daily. Swallow whole. 07/05/23  Yes Darron Deatrice LABOR, MD  atorvastatin  (LIPITOR) 20 MG tablet TAKE 1 TABLET BY MOUTH DAILY FOR CHOLESTEROL 02/04/24  Yes Clark, Katherine K, NP  Calcium -Magnesium -Vitamin D (CALCIUM  1200+D3 PO) Take 1 tablet by mouth daily. 10/05/22  Yes [provider]  Cholecalciferol  (DIALYVITE VITAMIN D 5000) 125 MCG (5000 UT) capsule Take 5,000 Units by mouth daily.   Yes [provider]  clopidogrel  (PLAVIX ) 75 MG tablet  01/15/24  Yes [provider]  Cyanocobalamin   5000 MCG TBDP Take 5,000 mcg by mouth daily. Vit B12   Yes [provider]  dexlansoprazole  (DEXILANT ) 60 MG capsule Take 1 capsule (60 mg total) by mouth daily. 12/18/23  Yes Therisa Bi, MD  DULoxetine  (CYMBALTA ) 60 MG capsule Take 60 mg by mouth daily.   Yes [provider]  FARXIGA  10 MG TABS tablet Take 10 mg by mouth every morning. 10/05/21  Yes [provider]  furosemide  (LASIX ) 20 MG tablet TAKE 1 TABLET(20 MG) BY MOUTH DAILY AS NEEDED 07/04/23  Yes Cindie Ole DASEN, MD  ibandronate  (BONIVA ) 150 MG tablet Take 1 tablet (150 mg total) by mouth every 30 (thirty) days. Take in the morning with a full glass of water, on an empty stomach, and do not take anything else by mouth or lie down for the next 30 min. 10/17/22  Yes Clark, Katherine K, NP  losartan  (COZAAR ) 100 MG tablet TAKE 1 TABLET BY MOUTH DAILY FOR BLOOD PRESSURE 02/04/24  Yes Clark, Katherine K, NP  magnesium  oxide (MAG-OX) 400 (240 Mg) MG tablet TAKE 1 TABLET(400 MG) BY MOUTH DAILY 12/10/23  Yes Cindie Ole DASEN, MD  omeprazole  (PRILOSEC) 20 MG capsule Take 20 mg  by mouth daily.   Yes [provider]  ondansetron  (ZOFRAN -ODT) 4 MG disintegrating tablet Take 1 tablet (4 mg total) by mouth every 8 (eight) hours as needed for nausea or vomiting. 08/08/23  Yes Clark, Katherine K, NP  sertraline  (ZOLOFT ) 25 MG tablet Take 1 tablet (25 mg total) by mouth daily. for anxiety and depression. 02/05/24  Yes Clark, Katherine K, NP  triamcinolone  cream (KENALOG ) 0.1 % Apply 1 Application topically 2 (two) times daily as needed. 03/12/24  Yes Hester Alm BROCKS, MD  albuterol  (PROVENTIL ) (2.5 MG/3ML) 0.083% nebulizer solution Take 3 mLs (2.5 mg total) by nebulization every 6 (six) hours as needed for wheezing or shortness of breath. 02/15/24   Clark, Katherine K, NP  apixaban  (ELIQUIS ) 5 MG TABS tablet Take 1 tablet (5 mg total) by mouth 2 (two) times daily. Take 1 tablet twice daily for 31 days. 03/12/24   Riddle, Suzann, NP     Family History Family History  Problem Relation Age of Onset   Heart disease Mother    Breast cancer Neg Hx    Colon cancer Neg Hx    Esophageal cancer Neg Hx    Inflammatory bowel disease Neg Hx    Liver disease Neg Hx    Pancreatic cancer Neg Hx    Rectal cancer Neg Hx    Stomach cancer Neg Hx     Social History Social History   Tobacco Use   Smoking status: Former    Current packs/day: 2.00    Average packs/day: 2.0 packs/day for 26.0 years (52.1 ttl pk-yrs)    Types: Cigarettes    Start date: 2001   Smokeless tobacco: Never  Vaping Use   Vaping status: Never Used  Substance Use Topics   Alcohol use: Never   Drug use: Never     Allergies   Alendronate  and Cefaclor   Review of Systems Review of Systems  Constitutional:  Negative for chills and fever.  HENT:  Positive for ear pain and sore throat.   Respiratory:  Negative for cough and shortness of breath.      Physical Exam Triage Vital Signs ED Triage Vitals  Encounter Vitals Group     BP      Girls Systolic BP Percentile      Girls Diastolic BP Percentile      Boys Systolic BP Percentile      Boys Diastolic BP Percentile      Pulse      Resp      Temp      Temp src      SpO2      Weight      Height      Head Circumference      Peak Flow      Pain Score      Pain Loc      Pain Education      Exclude from Growth Chart    No data found.  Updated Vital Signs BP 119/79 (BP Location: Left Arm)   Pulse (!) 117   Temp 98.7 F (37.1 C) (Oral)   Resp 16   SpO2 95%   Visual Acuity Right Eye Distance:   Left Eye Distance:   Bilateral Distance:    Right Eye Near:   Left Eye Near:    Bilateral Near:     Physical Exam Constitutional:      General: She is not in acute distress. HENT:     Right Ear: Tympanic membrane normal.  Left Ear: Tympanic membrane normal.     Nose: Nose normal.     Mouth/Throat:     Mouth: Mucous membranes are moist.     Pharynx: Oropharynx is clear.   Cardiovascular:     Rate and Rhythm: Normal rate and regular rhythm.     Heart sounds: Normal heart sounds.  Pulmonary:     Effort: Pulmonary effort is normal. No respiratory distress.     Breath sounds: Normal breath sounds.  Neurological:     Mental Status: She is alert.      UC Treatments / Results  Labs (all labs ordered are listed, but only abnormal results are displayed) Labs Reviewed  POCT RAPID STREP A (OFFICE) - Normal  POC SOFIA SARS ANTIGEN FIA - Normal    EKG   Radiology No results found.  Procedures Procedures (including critical care time)  Medications Ordered in UC Medications - No data to display  Initial Impression / Assessment and Plan / UC Course  I have reviewed the triage vital signs and the nursing notes.  Pertinent labs & imaging results that were available during my care of the patient were reviewed by me and considered in my medical decision making (see chart for details).    Viral pharyngitis.  Rapid COVID and strep negative.  Discussed symptomatic treatment including Tylenol  as needed.  Education provided on pharyngitis.  Instructed her to follow-up with her PCP if she is not improving.  She agrees to plan of care.  Final Clinical Impressions(s) / UC Diagnoses   Final diagnoses:  Sore throat  Viral pharyngitis     Discharge Instructions      The COVID and strep tests are negative.   Take Tylenol  as needed for fever or discomfort.    Follow-up with your primary care provider if your symptoms are not improving.         ED Prescriptions   None    PDMP not reviewed this encounter.   Corlis Burnard DEL, NP 04/25/24 1352

## 2024-04-25 NOTE — Progress Notes (Signed)
 Last read by Hennesy Blazejewski at 1:44PM on 04/25/2024.

## 2024-04-25 NOTE — ED Triage Notes (Signed)
 Patient states that sore throat started last night. Patient states that her left ear is also hurting and that started last night.

## 2024-04-25 NOTE — Discharge Instructions (Addendum)
 The COVID and strep tests are negative.   Take Tylenol  as needed for fever or discomfort.    Follow-up with your primary care provider if your symptoms are not improving.

## 2024-05-06 NOTE — Progress Notes (Unsigned)
  Electrophysiology Office Follow up Visit Note:    Date:  05/07/2024   ID:  Amy Underwood, DOB [birth year], MRN [id number]  PCP:  Gretta Comer POUR, NP  [facility] Cardiologist:  None  [facility] Electrophysiologist:  OLE ONEIDA HOLTS, MD    Interval History:     Amy Underwood is a 78 y.o. female who presents for a follow up visit.   She last saw Amy Underwood on March 28, 2024.  She has a history of persistent atrial fibrillation, watchman implant, chronic diastolic heart failure, mesenteric ischemia with a recent stent.  She was previously on dofetilide .  Now she is on amiodarone .  Amiodarone  caused junctional rhythm.  She is now on low-dose amiodarone , 100 mg by mouth once daily.  She is doing well today.  She tells me that the vast majority the time her heart rhythm is normal.  She rarely will have episodes of arrhythmia.  She is having an episode now during her appointment with heart rates in the 110s.  Despite being out of rhythm she is tolerating and far better than she was previously now that she is taking low-dose amiodarone .  She is not taking Eliquis .  She is taking aspirin  81 mg by mouth once daily.  She is strongly in favor of a more conservative management strategy for her arrhythmias.      Past medical, surgical, social and family history were reviewed.  ROS:   Please see the history of present illness.    All other systems reviewed and are negative.  EKGs/Labs/Other Studies Reviewed:    The following studies were reviewed today:   April 18, 2024 ZIO monitor No atrial fibrillation Frequent supraventricular ectopy, 5.4%  EKG Interpretation Date/Time:  Wednesday May 07 2024 09:08:36 EDT Ventricular Rate:  112 PR Interval:    QRS Duration:  62 QT Interval:  346 QTC Calculation: 472 R Axis:   -72  Text Interpretation: Atrial flutter. Confirmed by HOLTS OLE [phone]) on 05/07/2024 9:11:48 AM    Physical Exam:    VS:  BP 138/80 (BP Location: Left  Arm, Patient Position: Sitting, Cuff Size: Normal)   Pulse (!) 112   Ht 5' 3 (1.6 m)   Wt 153 lb 2 oz (69.5 kg)   SpO2 98%   BMI 27.12 kg/m     Wt Readings from Last 3 Encounters:  05/07/24 153 lb 2 oz (69.5 kg)  03/28/24 151 lb 3.2 oz (68.6 kg)  03/19/24 147 lb (66.7 kg)     GEN: no distress CARD: Regular rhythm, tachycardic, No MRG RESP: No IWOB. CTAB.      ASSESSMENT:    1. Persistent atrial fibrillation (HCC)   2. Atrial flutter, unspecified type (HCC)   3. Presence of Watchman left atrial appendage closure device   4. Encounter for long-term (current) use of high-risk medication    PLAN:    In order of problems listed above:  #Persistent atrial fibrillation #High risk med monitoring-amiodarone  #Watchman in situ Good control of her arrhythmia on very low-dose amiodarone .  I would recommend continuing this for now.  I would not recommend being more aggressive with higher dose amiodarone  or invasive EP procedures.  Recent heart monitor showed a very low burden of arrhythmia. Continue aspirin  81 mg by mouth once daily  Check CMP, TSH and free T4 today  Follow-up 6 months with APP   Signed, OLE HOLTS, MD, [facility], [facility] 05/07/2024 9:19 AM    Electrophysiology [facility]

## 2024-05-07 ENCOUNTER — Other Ambulatory Visit: Payer: Self-pay

## 2024-05-07 ENCOUNTER — Encounter: Payer: Self-pay | Admitting: Cardiology

## 2024-05-07 ENCOUNTER — Ambulatory Visit: Attending: Cardiology | Admitting: Cardiology

## 2024-05-07 VITALS — BP 138/80 | HR 112 | Ht 63.0 in | Wt 153.1 lb

## 2024-05-07 DIAGNOSIS — Z79899 Other long term (current) drug therapy: Secondary | ICD-10-CM

## 2024-05-07 DIAGNOSIS — I4819 Other persistent atrial fibrillation: Secondary | ICD-10-CM

## 2024-05-07 DIAGNOSIS — I4892 Unspecified atrial flutter: Secondary | ICD-10-CM

## 2024-05-07 DIAGNOSIS — Z95818 Presence of other cardiac implants and grafts: Secondary | ICD-10-CM

## 2024-05-07 NOTE — Patient Instructions (Signed)
 Medication Instructions:  Your physician recommends that you continue on your current medications as directed. Please refer to the Current Medication list given to you today.  *If you need a refill on your cardiac medications before your next appointment, please call your pharmacy*  Lab Work: TODAY: CMET, TSH, T4   Follow-Up: At [facility], you and your health needs are our priority.  As part of our continuing mission to provide you with exceptional heart care, our providers are all part of one team.  This team includes your primary Cardiologist (physician) and Advanced Practice Providers or APPs (Physician Assistants and Nurse Practitioners) who all work together to provide you with the care you need, when you need it.  Your next appointment:   6 months  Provider:   You will see one of the following Advanced Practice Providers on your designated Care Team:   Mertha Abrahams, Kennard Pea [address] [place], PA-C Suzann Riddle, NP Creighton Doffing, NP

## 2024-05-08 ENCOUNTER — Telehealth: Payer: Self-pay

## 2024-05-08 LAB — COMPREHENSIVE METABOLIC PANEL WITH GFR
ALT: 17 IU/L (ref 0–32)
AST: 28 IU/L (ref 0–40)
Albumin: 4.2 g/dL (ref 3.8–4.8)
Alkaline Phosphatase: 170 IU/L — ABNORMAL HIGH (ref 44–121)
BUN/Creatinine Ratio: 21 (ref 12–28)
BUN: 31 mg/dL — ABNORMAL HIGH (ref 8–27)
Bilirubin Total: 0.4 mg/dL (ref 0.0–1.2)
CO2: 20 mmol/L (ref 20–29)
Calcium: 9.4 mg/dL (ref 8.7–10.3)
Chloride: 105 mmol/L (ref 96–106)
Creatinine, Ser: 1.45 mg/dL — ABNORMAL HIGH (ref 0.57–1.00)
Globulin, Total: 2.3 g/dL (ref 1.5–4.5)
Glucose: 82 mg/dL (ref 70–99)
Potassium: 5 mmol/L (ref 3.5–5.2)
Sodium: 141 mmol/L (ref 134–144)
Total Protein: 6.5 g/dL (ref 6.0–8.5)
eGFR: 37 mL/min/1.73 — ABNORMAL LOW (ref >59)

## 2024-05-08 LAB — T4, FREE: Free T4: 1.14 ng/dL (ref 0.82–1.77)

## 2024-05-08 LAB — TSH: TSH: 2.9 u[IU]/mL (ref 0.450–4.500)

## 2024-05-08 NOTE — Telephone Encounter (Signed)
 Patient states Otezla  is approved but over $1000 copay. Patient is out of Otezla .  Two samples left at front desk and [employer] Patient Assistance application. We are going to see if patient is eligible for free drug. Aw  LOT: [id number] EXP: 07/04/25

## 2024-05-19 ENCOUNTER — Other Ambulatory Visit: Payer: Self-pay | Admitting: Primary Care

## 2024-05-19 DIAGNOSIS — I10 Essential (primary) hypertension: Secondary | ICD-10-CM

## 2024-05-19 DIAGNOSIS — E782 Mixed hyperlipidemia: Secondary | ICD-10-CM

## 2024-05-19 MED ORDER — ATORVASTATIN CALCIUM 20 MG PO TABS: 20.0000 mg | ORAL_TABLET | Freq: Every day | ORAL | 1 refills | Status: AC

## 2024-05-19 MED ORDER — LOSARTAN POTASSIUM 100 MG PO TABS: 100.0000 mg | ORAL_TABLET | Freq: Every day | ORAL | 1 refills | Status: AC

## 2024-05-19 NOTE — Telephone Encounter (Unsigned)
 Copied from CRM [id number]. Topic: Clinical - Medication Refill >> May 19, 2024 10:40 AM Viola F wrote: Medication:  atorvastatin  (LIPITOR) 20 MG tablet [[id number]] losartan  (COZAAR ) 100 MG tablet [[id number]]  Has the patient contacted their pharmacy? No (Agent: If no, request that the patient contact the pharmacy for the refill. If patient does not wish to contact the pharmacy document the reason why and proceed with request.) (Agent: If yes, when and what did the pharmacy advise?)  This is the patient's preferred pharmacy:   [id number] - [place], [place] - [address] AT [facility] OF [address] [facility] [address] [place] [address] Phone: [phone] Fax: [phone]   Is this the correct pharmacy for this prescription? Yes If no, delete pharmacy and type the correct one.   Has the prescription been filled recently? Yes  Is the patient out of the medication? Yes, 4 days left   Has the patient been seen for an appointment in the last year OR does the patient have an upcoming appointment? Yes  Can we respond through MyChart? Yes  Agent: Please be advised that Rx refills may take up to 3 business days. We ask that you follow-up with your pharmacy.

## 2024-05-19 NOTE — Telephone Encounter (Signed)
 Noted, new Rxs sent to [facility]

## 2024-05-19 NOTE — Telephone Encounter (Signed)
 Called and spoke with patient, she is switching everything over to [facility]. She no longer wants to use [facility] .

## 2024-05-19 NOTE — Telephone Encounter (Signed)
 Plenty of refills for both prescriptions were sent to [facility] mail order pharmacy in June 2025. Has she switched to [facility] pharmacy now?

## 2024-05-22 ENCOUNTER — Telehealth: Payer: Self-pay

## 2024-05-22 NOTE — Telephone Encounter (Signed)
 Patient has been denied for [employer].  She has to apply for Medicare Extra Help. Spoke with patient and advised her of information. She is going to apply and keep in touch with me if she is approved or denied. aw

## 2024-06-05 ENCOUNTER — Ambulatory Visit (INDEPENDENT_AMBULATORY_CARE_PROVIDER_SITE_OTHER): Admitting: *Deleted

## 2024-06-05 DIAGNOSIS — Z23 Encounter for immunization: Secondary | ICD-10-CM | POA: Diagnosis not present

## 2024-06-10 ENCOUNTER — Telehealth: Payer: Self-pay

## 2024-06-10 NOTE — Telephone Encounter (Signed)
   Pre-operative Risk Assessment    Patient Name: Amy Underwood  DOB: May 24, 1946 MRN: [id number]   Date of last office visit: 05/07/24 OLE HOLTS, MD Date of next office visit: NONE  Request for Surgical Clearance    Procedure:  FILLING AND CROWNS  Date of Surgery:  Clearance TBD                                Surgeon:  NOT INDICATED Surgeon's Group or Practice Name:  [facility]  Phone number:  [phone] Fax number:  [phone]   Type of Clearance Requested:   - Medical  - Pharmacy:  Hold Aspirin  and Clopidogrel  (Plavix )     Type of Anesthesia:  Local    Additional requests/questions:    Signed, Lucie DELENA Ku   06/10/2024, 5:50 PM '

## 2024-06-11 ENCOUNTER — Ambulatory Visit (INDEPENDENT_AMBULATORY_CARE_PROVIDER_SITE_OTHER)

## 2024-06-11 ENCOUNTER — Ambulatory Visit: Admission: EM | Admit: 2024-06-11 | Discharge: 2024-06-11 | Disposition: A | Discharge status: Home / Self Care

## 2024-06-11 ENCOUNTER — Telehealth: Payer: Self-pay

## 2024-06-11 DIAGNOSIS — J4521 Mild intermittent asthma with (acute) exacerbation: Secondary | ICD-10-CM | POA: Diagnosis not present

## 2024-06-11 DIAGNOSIS — R0602 Shortness of breath: Secondary | ICD-10-CM

## 2024-06-11 DIAGNOSIS — I4819 Other persistent atrial fibrillation: Secondary | ICD-10-CM

## 2024-06-11 DIAGNOSIS — R0789 Other chest pain: Secondary | ICD-10-CM | POA: Diagnosis not present

## 2024-06-11 DIAGNOSIS — R058 Other specified cough: Secondary | ICD-10-CM

## 2024-06-11 LAB — POC SOFIA SARS ANTIGEN FIA: SARS Coronavirus 2 Ag: NEGATIVE

## 2024-06-11 MED ORDER — ALBUTEROL SULFATE HFA 108 (90 BASE) MCG/ACT IN AERS
2.0000 | INHALATION_SPRAY | Freq: Once | RESPIRATORY_TRACT | Status: AC
  Administered 2024-06-11: 2 via RESPIRATORY_TRACT

## 2024-06-11 MED ORDER — PREDNISONE 10 MG PO TABS
40.0000 mg | ORAL_TABLET | Freq: Every day | ORAL | 0 refills | Status: AC
Start: 2024-06-11 — End: 2024-06-16

## 2024-06-11 MED ORDER — ALBUTEROL SULFATE (2.5 MG/3ML) 0.083% IN NEBU
2.5000 mg | INHALATION_SOLUTION | Freq: Once | RESPIRATORY_TRACT | Status: AC
  Administered 2024-06-11: 2.5 mg via RESPIRATORY_TRACT

## 2024-06-11 NOTE — Telephone Encounter (Signed)
 Copied from CRM #[id number]. Topic: General - Other >> Jun 11, 2024 12:12 PM Thersia C wrote: Reason for CRM: [facility] called in regarding surgery clearance form that was sent yesterday wanted to know if it was received, would like a callback if not to be resent  [phone]

## 2024-06-11 NOTE — ED Triage Notes (Addendum)
 Patient to Urgent Care with complaints of chest congestion/ productive cough (thick/ yellow). Denies any known fevers.   Symptoms x2 days.   Meds: tylenol  (requests refill of albuterol  inhaler).

## 2024-06-11 NOTE — Telephone Encounter (Signed)
 Form received placed in Kates inbox for review and completion.

## 2024-06-11 NOTE — Discharge Instructions (Addendum)
 Use the albuterol  inhaler or nebulizer treatment as directed.  Take the prednisone  as directed.  Follow up with your primary care provider tomorrow.  Go to the emergency department if you have worsening symptoms.

## 2024-06-11 NOTE — ED Provider Notes (Signed)
 Amy Underwood    CSN: [id number] Arrival date & time: 06/11/24  1513      History   Chief Complaint Chief Complaint  Patient presents with   Cough    HPI Amy Underwood is a 78 y.o. female.  Patient presents with 2-day history of congestion, productive cough, chest tightness, shortness of breath.  She has history of asthma and is currently out of her albuterol  inhaler.  She used her albuterol  nebulizer yesterday.  She took Tylenol  this morning.  She denies chest pain, palpitations, fever, chills.  Patient's medical history includes asthma and persistent atrial fibrillation.  The history is provided by the patient and medical records.    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   Dilated bile duct 04/27/2023   Bilateral carpal tunnel syndrome  12/20/2022   Paresthesia of both hands 09/21/2022   Arthritis of both glenohumeral joints 06/01/2022   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   Persistent atrial fibrillation (HCC) 04/20/2022   Secondary hypercoagulable state 01/31/2022   Chronic gout 12/14/2021   Varicose veins of leg with pain, bilateral 08/16/2021   Trigger finger of left hand 07/18/2021   Anemia in chronic kidney disease 07/11/2021   Chronic kidney disease, stage IV (severe) (HCC) 07/11/2021   Mixed hyperlipidemia 07/05/2020   Chronic anticoagulation 10/02/2019   History of revision of total replacement of right hip joint 07/28/2019   Paroxysmal atrial fibrillation (HCC) 01/08/2019   Heart murmur 12/18/2016   Asthma 09/23/2014   Essential hypertension 09/23/2014   Gastroesophageal reflux disease 09/23/2014   Pernicious anemia 09/23/2014   Psoriasis 09/23/2014   Hip hematoma, left 03/02/2014   IDA (iron deficiency anemia) 07/09/2013    Past Surgical History:  Procedure Laterality Date   ATRIAL FIBRILLATION ABLATION N/A 01/03/2022   Procedure: ATRIAL FIBRILLATION ABLATION;  Surgeon: Cindie Ole DASEN, MD;  Location: [facility];  Service: Cardiovascular;  Laterality: N/A;   CARDIOVERSION N/A 03/17/2024   Procedure: CARDIOVERSION;  Surgeon: Darliss Rogue, MD;  Location: [facility] OR[facility];  Service: Cardiovascular;  Laterality: N/A;   CARPAL TUNNEL RELEASE Right 01/18/2023   Procedure: CARPAL TUNNEL RELEASE;  Surgeon: Dozier Soulier, MD;  Location: [facility];  Service: Orthopedics;  Laterality: Right;   CESAREAN SECTION     3   CHOLECYSTECTOMY  ESOPHAGOGASTRODUODENOSCOPY N/A 01/16/2024   Procedure: EGD (ESOPHAGOGASTRODUODENOSCOPY);  Surgeon: Therisa Bi, MD;  Location: [facility] ENDOSCOPY;  Service: Gastroenterology;  Laterality: N/A;   HIP ARTHROPLASTY Left    KNEE ARTHROPLASTY Bilateral    LEFT ATRIAL APPENDAGE OCCLUSION N/A 04/20/2022   Procedure: LEFT ATRIAL APPENDAGE OCCLUSION;   Surgeon: Cindie Ole DASEN, MD;  Location: [facility];  Service: Cardiovascular;  Laterality: N/A;   PERIPHERAL VASCULAR INTERVENTION  07/18/2023   Procedure: PERIPHERAL VASCULAR INTERVENTION;  Surgeon: Darron Deatrice LABOR, MD;  Location: [facility];  Service: Cardiovascular;;   REVERSE SHOULDER ARTHROPLASTY Right 01/18/2023   Procedure: REVERSE SHOULDER ARTHROPLASTY;  Surgeon: Dozier Soulier, MD;  Location: [facility];  Service: Orthopedics;  Laterality: Right;   TEE WITHOUT CARDIOVERSION N/A 04/20/2022   Procedure: TRANSESOPHAGEAL ECHOCARDIOGRAM (TEE);  Surgeon: Cindie Ole DASEN, MD;  Location: [facility] INVASIVE CV LAB;  Service: Cardiovascular;  Laterality: N/A;   VISCERAL ANGIOGRAPHY N/A 07/18/2023   Procedure: MESENTRIC  ANGIOGRAPHY;  Surgeon: Darron Deatrice LABOR, MD;  Location: [facility];  Service: Cardiovascular;  Laterality: N/A;    OB History   No obstetric history on file.      Home Medications    Prior to Admission medications   Medication Sig Start Date End Date Taking? Authorizing Provider  allopurinol  (ZYLOPRIM ) 100 MG tablet Take 100 mg by mouth at bedtime. 12/21/21  Yes [provider]  amiodarone  (PACERONE ) 100 MG tablet Take 1 tablet (100 mg total) by mouth daily. 03/28/24  Yes Riddle, Suzann, NP  amLODipine  (NORVASC ) 10 MG tablet Take 1 tablet (10 mg total) by mouth daily. 01/10/22  Yes Lesia Ozell Barter, PA-C  amLODipine  (NORVASC ) 5 MG tablet Take 5 mg by mouth daily. 03/11/24  Yes [provider]  Apremilast  (OTEZLA ) 30 MG TABS Take 1 tablet (30 mg total) by mouth 2 (two) times daily. 03/31/24  Yes Hester Alm BROCKS, MD  aspirin  EC 81 MG tablet Take 1 tablet (81 mg total) by mouth daily. Swallow whole. 07/05/23  Yes Darron Deatrice LABOR, MD  atorvastatin  (LIPITOR) 20 MG tablet Take 1 tablet (20 mg total) by mouth daily. for cholesterol. 05/19/24  Yes Gretta Comer POUR, NP  clopidogrel  (PLAVIX ) 75 MG tablet  01/15/24  Yes [provider]  dexlansoprazole  (DEXILANT ) 60 MG capsule Take 1 capsule (60 mg total) by mouth daily. 12/18/23  Yes Therisa Bi, MD  DULoxetine  (CYMBALTA ) 60 MG capsule Take 60 mg by mouth daily.   Yes [provider]  FARXIGA  10 MG TABS tablet Take 10 mg by mouth every morning. 10/05/21  Yes [provider]  losartan  (COZAAR ) 100 MG tablet Take 1 tablet (100 mg total) by mouth daily. for blood pressure 05/19/24  Yes Clark, Katherine K, NP  magnesium  oxide (MAG-OX) 400 (240 Mg) MG tablet TAKE 1 TABLET(400 MG) BY MOUTH DAILY 12/10/23  Yes Cindie Ole DASEN, MD  omeprazole  (PRILOSEC) 20 MG capsule Take 20 mg by mouth daily.   Yes [provider]  predniSONE  (DELTASONE ) 10 MG tablet Take 4 tablets (40 mg total) by mouth daily for 5 days. 06/11/24 06/16/24 Yes Corlis Burnard DEL, NP  sertraline  (ZOLOFT ) 25 MG tablet Take 1 tablet (25 mg total) by mouth daily. for anxiety and depression. 02/05/24  Yes Gretta Comer POUR, NP  albuterol  (PROVENTIL ) (2.5 MG/3ML) 0.083% nebulizer solution Take 3 mLs (2.5 mg total) by nebulization every 6 (six) hours as needed for wheezing or shortness of breath. 02/15/24   Clark, Katherine K, NP  Calcium -Magnesium -Vitamin D (CALCIUM  1200+D3 PO) Take 1 tablet by mouth daily. 10/05/22   [provider]  Cholecalciferol  (DIALYVITE VITAMIN D 5000) 125 MCG (5000 UT) capsule Take 5,000 Units by mouth daily.    [provider]  Cyanocobalamin  5000 MCG TBDP Take 5,000 mcg by mouth daily. Vit B12    [provider]  furosemide  (LASIX ) 20 MG tablet TAKE 1 TABLET(20 MG) BY MOUTH DAILY AS NEEDED 07/04/23   Cindie Ole DASEN, MD  triamcinolone  cream (KENALOG ) 0.1 % Apply 1 Application topically 2 (two) times daily as needed. 03/12/24   Hester Alm BROCKS, MD    Family History Family History  Problem Relation Age of Onset   Heart disease Mother    Breast cancer Neg Hx    Colon cancer Neg Hx    Esophageal cancer Neg Hx    Inflammatory bowel  disease Neg Hx    Liver disease Neg Hx    Pancreatic cancer Neg Hx    Rectal cancer Neg Hx    Stomach cancer Neg Hx     Social History Social History   Tobacco Use   Smoking status: Former    Current packs/day: 2.00    Average packs/day: 2.0 packs/day for 26.2 years (52.3 ttl pk-yrs)    Types: Cigarettes    Start date: 2001   Smokeless tobacco: Never  Vaping Use   Vaping status: Never Used  Substance Use Topics   Alcohol use: Never   Drug use: Never     Allergies   Alendronate  and Cefaclor   Review of Systems Review of Systems  Constitutional:  Negative for chills and fever.  HENT:  Positive for congestion. Negative for ear pain and sore throat.   Respiratory:  Positive for cough, chest tightness and shortness of breath.   Cardiovascular:  Negative for chest pain and palpitations.     Physical Exam Triage Vital Signs ED Triage Vitals  Encounter Vitals Group     BP 06/11/24 1618 133/83     Girls Systolic BP Percentile --      Girls Diastolic BP Percentile --      Boys Systolic BP Percentile --      Boys Diastolic BP Percentile --      Pulse Rate 06/11/24 1618 92     Resp 06/11/24 1618 18     Temp 06/11/24 1618 98.3 F (36.8 C)     Temp src --      SpO2 06/11/24 1618 98 %     Weight --      Height --      Head Circumference --      Peak Flow --      Pain Score 06/11/24 1628 7     Pain Loc --      Pain Education --      Exclude from Growth Chart --    No data found.  Updated Vital Signs BP 133/83   Pulse 92   Temp 98.3 F (36.8 C)   Resp 18   SpO2 98%   Visual Acuity Right Eye Distance:   Left Eye Distance:   Bilateral Distance:    Right Eye Near:   Left Eye Near:    Bilateral Near:     Physical Exam Constitutional:      General: She is not in acute distress. HENT:     Right Ear: Tympanic membrane normal.     Left Ear: Tympanic membrane normal.     Nose: Nose normal.  Mouth/Throat:     Mouth: Mucous membranes are moist.      Pharynx: Oropharynx is clear.  Cardiovascular:     Rate and Rhythm: Normal rate and regular rhythm.     Heart sounds: Normal heart sounds.  Pulmonary:     Effort: Pulmonary effort is normal. No respiratory distress.     Breath sounds: Wheezing and rhonchi present.     Comments: Bilateral expiratory wheezes and rhonchi. Neurological:     Mental Status: She is alert.      UC Treatments / Results  Labs (all labs ordered are listed, but only abnormal results are displayed) Labs Reviewed  POC SOFIA SARS ANTIGEN FIA - Normal    EKG   Radiology DG Chest 2 View Result Date: 06/11/2024 CLINICAL DATA:  productive cough, SOB EXAM: CHEST - 2 VIEW COMPARISON:  02/15/2024 FINDINGS: Elevation of the right hemidiaphragm. No focal airspace consolidation, pleural effusion, or pneumothorax. No cardiomegaly. Tortuous aorta with aortic atherosclerosis. No acute fracture or destructive lesions. Multilevel thoracic osteophytosis. Right shoulder arthroplasty. Upper abdominal surgical clips. IMPRESSION: No acute cardiopulmonary abnormality. Electronically Signed   By: Rogelia Myers M.D.   On: 06/11/2024 17:09    Procedures Procedures (including critical care time)  Medications Ordered in UC Medications  albuterol  (PROVENTIL ) (2.5 MG/3ML) 0.083% nebulizer solution 2.5 mg (2.5 mg Nebulization Given 06/11/24 1716)  albuterol  (VENTOLIN  HFA) 108 (90 Base) MCG/ACT inhaler 2 puff (2 puffs Inhalation Given 06/11/24 1725)    Initial Impression / Assessment and Plan / UC Course  I have reviewed the triage vital signs and the nursing notes.  Pertinent labs & imaging results that were available during my care of the patient were reviewed by me and considered in my medical decision making (see chart for details).   Shortness of breath, chest tightness, productive cough, asthma exacerbation, persistent atrial fibrillation.  Afebrile and vital signs are stable.  O2 sat is 98% on room air.  Discussed limitations  of evaluation of her symptoms in an urgent care setting but patient declines transfer to the ED. EKG shows atrial fibrillation, rate 109, no ST elevation, compared to previous on 05/07/2024.  CXR negative.  COVID negative.  Albuterol  nebulizer treatment given here; lung sounds greatly improved after treatment; no wheezing or rhonchi; patient reports complete relief of her chest tightness and shortness of breath.  Discussed atrial fibrillation noted on EKG but patient states this is her baseline and continues to decline transfer to the ED.  She is followed by cardiology; last seen on 05/07/2024.  Because patient is out of her albuterol  inhaler, albuterol  inhaler given here today.  She states she has plenty of the nebulizer solution at home.  Instructed her to continue using the albuterol  inhaler or nebulizer treatment as directed.  Also treating today with 5-day course of prednisone  for her asthma exacerbation.  Instructed her to follow-up with her PCP tomorrow.  Strict ED precautions discussed.  Education provided on shortness of breath, asthma, atrial fibrillation.  Patient agrees to plan of care.  Final Clinical Impressions(s) / UC Diagnoses   Final diagnoses:  Shortness of breath  Chest tightness  Productive cough  Mild intermittent asthma with acute exacerbation  Persistent atrial fibrillation [facility])     Discharge Instructions      Use the albuterol  inhaler or nebulizer treatment as directed.  Take the prednisone  as directed.  Follow up with your primary care provider tomorrow.  Go to the emergency department if you have worsening symptoms.  ED Prescriptions     Medication Sig Dispense Auth. Provider   predniSONE  (DELTASONE ) 10 MG tablet Take 4 tablets (40 mg total) by mouth daily for 5 days. 20 tablet Corlis Burnard DEL, NP      PDMP not reviewed this encounter.   Corlis Burnard DEL, NP 06/11/24 [phone]

## 2024-06-11 NOTE — Telephone Encounter (Signed)
   Patient Name: Amy Underwood  DOB: 1946/04/02 MRN: [id number]  Primary Cardiologist: None  Chart reviewed as part of pre-operative protocol coverage.   IF SIMPLE EXTRACTION/CLEANINGS: Simple dental extractions (i.e. 1-2 teeth) are considered low risk procedures per guidelines and generally do not require any specific cardiac clearance. Crowns are also low risk procedure. It is also generally accepted that for simple extractions and dental cleanings, there is no need to interrupt blood thinner therapy. Patient is no longer on Plavix  per recent office note.  SBE prophylaxis is not required for the patient from a cardiac standpoint.  I will route this recommendation to the requesting party via Epic fax function and remove from pre-op pool.  Please call with questions.  Lamarr Satterfield, NP 06/11/2024, 9:40 AM

## 2024-06-12 NOTE — Telephone Encounter (Signed)
 Form has been faxed back to [facility] and received faxed confirmation.

## 2024-06-12 NOTE — Telephone Encounter (Signed)
 Completed and placed in Kelli's inbox.

## 2024-06-18 ENCOUNTER — Ambulatory Visit (INDEPENDENT_AMBULATORY_CARE_PROVIDER_SITE_OTHER)
Admission: RE | Admit: 2024-06-18 | Discharge: 2024-06-18 | Disposition: A | Discharge status: Home / Self Care | Source: Ambulatory Visit | Attending: Primary Care | Admitting: Primary Care

## 2024-06-18 ENCOUNTER — Ambulatory Visit (INDEPENDENT_AMBULATORY_CARE_PROVIDER_SITE_OTHER): Admitting: Primary Care

## 2024-06-18 ENCOUNTER — Encounter: Payer: Self-pay | Admitting: Primary Care

## 2024-06-18 VITALS — BP 134/88 | HR 119 | Temp 97.8°F | Ht 63.0 in | Wt 155.0 lb

## 2024-06-18 DIAGNOSIS — R051 Acute cough: Secondary | ICD-10-CM

## 2024-06-18 MED ORDER — DOXYCYCLINE HYCLATE 100 MG PO TABS: 100.0000 mg | ORAL_TABLET | Freq: Two times a day (BID) | ORAL | 0 refills | Status: AC

## 2024-06-18 MED ORDER — PREDNISONE 20 MG PO TABS: 60.0000 mg | ORAL_TABLET | Freq: Every day | ORAL | 0 refills | Status: AC

## 2024-06-18 NOTE — Progress Notes (Signed)
 Subjective:    Patient ID: Amy Underwood, female    DOB: 11-Jan-1946, 78 y.o.   MRN: [id number]  Amy Underwood is a very pleasant 78 y.o. female with a history of hypertension, paroxysmal atrial fibrillation, atrial flutter, aspiration pneumonia, CKD, anemia, asthma, gout, s/p bariatric surgery who presents today for urgent care follow-up.  Evaluated on 06/11/2024 at urgent care for 2-day history of congestion, productive cough, chest tightness, shortness of breath despite use of her albuterol  inhaler.  During her stay she underwent EKG which showed atrial fibrillation with rate of 109 without ST elevation.  Chest x-ray was negative for acute process.  COVID-19 test was negative.  She was provided with an albuterol  treatment nebulized in the office and prescribed albuterol  inhaler to use as needed and a course of prednisone .  Today she discusses an ongoing cough and congestion, fatigue. She completed the prednisone  course and was improved during the course. As soon as she finished prednisone  her symptoms increased. She's been using the nebulizer machine without much improvement.    Review of Systems  Constitutional:  Positive for fatigue. Negative for fever.  HENT:  Positive for congestion. Negative for sinus pain.   Respiratory:  Positive for chest tightness and shortness of breath.   Neurological:  Negative for headaches.         Past Medical History:  Diagnosis Date   Adhesive capsulitis of right shoulder    Asthma    Benign hypertensive kidney disease with chronic kidney disease 07/11/2021   Bilateral hand numbness 08/08/2022   Chronic nausea 04/28/2023   Hyperlipidemia    Hypertension    Iron deficiency anemia    Leukopenia 09/23/2014   Osteoarthritis    Osteoarthritis of hip 09/23/2014   Pain of upper abdomen 09/21/2023   Paroxysmal atrial fibrillation (HCC)    Pernicious anemia    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   Watchman FLX 31mm with  Dr. Cindie   Psoriasis    Unintentional weight loss 04/28/2023    Social History   Socioeconomic History   Marital status: Widowed    Spouse name: Not on file   Number of children: 2   Years of education: Not on file   Highest education level: GED or equivalent  Occupational History   Occupation: retired  Tobacco Use   Smoking status: Former    Current packs/day: 2.00    Average packs/day: 2.0 packs/day for 26.2 years (52.4 ttl pk-yrs)    Types: Cigarettes    Start date: 2001   Smokeless tobacco: Never  Vaping Use   Vaping status: Never Used  Substance and Sexual Activity   Alcohol use: Never   Drug use: Never   Sexual activity: Not Currently  Other Topics Concern   Not on file  Social History Narrative   Not on file   Social Drivers of Health   Financial Resource Strain: Low Risk  (02/15/2024)   Overall Financial Resource Strain (CARDIA)    Difficulty of Paying Living Expenses: Not hard at all  Food Insecurity: No Food Insecurity (02/15/2024)   Hunger Vital Sign    Worried About Running Out of Food in the Last Year: Never true    Ran Out of Food in the Last Year: Never true  Transportation Needs: No Transportation Needs (02/15/2024)   PRAPARE - Administrator, Civil Service (Medical): No    Lack of Transportation (Non-Medical): No  Physical Activity: Insufficiently Active (02/15/2024)   Exercise Vital  Sign    Days of Exercise per Week: 1 day    Minutes of Exercise per Session: 30 min  Stress: Stress Concern Present (02/15/2024)   [employer] of Occupational Health - Occupational Stress Questionnaire    Feeling of Stress: To some extent  Social Connections: Moderately Isolated (02/15/2024)   Social Connection and Isolation Panel    Frequency of Communication with Friends and Family: Three times a week    Frequency of Social Gatherings with Friends and Family: More than three times a week    Attends Religious Services: 1 to 4 times per year     Active Member of [employer] or Organizations: No    Attends Banker Meetings: Not on file    Marital Status: Widowed  Intimate Partner Violence: Not At Risk (05/02/2023)   Humiliation, Afraid, Rape, and Kick questionnaire    Fear of Current or Ex-Partner: No    Emotionally Abused: No    Physically Abused: No    Sexually Abused: No    Past Surgical History:  Procedure Laterality Date   ATRIAL FIBRILLATION ABLATION N/A 01/03/2022   Procedure: ATRIAL FIBRILLATION ABLATION;  Surgeon: Cindie Ole DASEN, MD;  Location: [facility];  Service: Cardiovascular;  Laterality: N/A;   CARDIOVERSION N/A 03/17/2024   Procedure: CARDIOVERSION;  Surgeon: Darliss Rogue, MD;  Location: [facility];  Service: Cardiovascular;  Laterality: N/A;   CARPAL TUNNEL RELEASE Right 01/18/2023   Procedure: CARPAL TUNNEL RELEASE;  Surgeon: Dozier Soulier, MD;  Location: [facility];  Service: Orthopedics;  Laterality: Right;   CESAREAN SECTION     3   CHOLECYSTECTOMY     ESOPHAGOGASTRODUODENOSCOPY N/A 01/16/2024   Procedure: EGD (ESOPHAGOGASTRODUODENOSCOPY);  Surgeon: Therisa Bi, MD;  Location: [facility];  Service: Gastroenterology;  Laterality: N/A;   HIP ARTHROPLASTY Left    KNEE ARTHROPLASTY Bilateral    LEFT ATRIAL APPENDAGE OCCLUSION N/A 04/20/2022   Procedure: LEFT ATRIAL APPENDAGE OCCLUSION;  Surgeon: Cindie Ole DASEN, MD;  Location: [facility];  Service: Cardiovascular;  Laterality: N/A;   PERIPHERAL VASCULAR INTERVENTION  07/18/2023   Procedure: PERIPHERAL VASCULAR INTERVENTION;  Surgeon: Darron Deatrice LABOR, MD;  Location: [facility];  Service: Cardiovascular;;   REVERSE SHOULDER ARTHROPLASTY Right 01/18/2023   Procedure: REVERSE SHOULDER ARTHROPLASTY;  Surgeon: Dozier Soulier, MD;  Location: [facility];  Service: Orthopedics;  Laterality: Right;   TEE WITHOUT CARDIOVERSION N/A 04/20/2022   Procedure: TRANSESOPHAGEAL ECHOCARDIOGRAM (TEE);  Surgeon: Cindie Ole DASEN, MD;   Location: [facility];  Service: Cardiovascular;  Laterality: N/A;   VISCERAL ANGIOGRAPHY N/A 07/18/2023   Procedure: MESENTRIC  ANGIOGRAPHY;  Surgeon: Darron Deatrice LABOR, MD;  Location: [facility];  Service: Cardiovascular;  Laterality: N/A;    Family History  Problem Relation Age of Onset   Heart disease Mother    Breast cancer Neg Hx    Colon cancer Neg Hx    Esophageal cancer Neg Hx    Inflammatory bowel disease Neg Hx    Liver disease Neg Hx    Pancreatic cancer Neg Hx    Rectal cancer Neg Hx    Stomach cancer Neg Hx     Allergies  Allergen Reactions   Alendronate  Palpitations   Cefaclor Hives and Rash    Current Outpatient Medications on File Prior to Visit  Medication Sig Dispense Refill   albuterol  (PROVENTIL ) (2.5 MG/3ML) 0.083% nebulizer solution Take 3 mLs (2.5 mg total) by nebulization every 6 (six) hours as needed for  wheezing or shortness of breath. 150 mL 0   allopurinol  (ZYLOPRIM ) 100 MG tablet Take 100 mg by mouth at bedtime.     amiodarone  (PACERONE ) 100 MG tablet Take 1 tablet (100 mg total) by mouth daily. 90 tablet 1   amLODipine  (NORVASC ) 5 MG tablet Take 5 mg by mouth daily.     aspirin  EC 81 MG tablet Take 1 tablet (81 mg total) by mouth daily. Swallow whole.     atorvastatin  (LIPITOR) 20 MG tablet Take 1 tablet (20 mg total) by mouth daily. for cholesterol. 90 tablet 1   Calcium -Magnesium -Vitamin D (CALCIUM  1200+D3 PO) Take 1 tablet by mouth daily.     Cholecalciferol  (DIALYVITE VITAMIN D 5000) 125 MCG (5000 UT) capsule Take 5,000 Units by mouth daily.     clopidogrel  (PLAVIX ) 75 MG tablet      Cyanocobalamin  5000 MCG TBDP Take 5,000 mcg by mouth daily. Vit B12     dexlansoprazole  (DEXILANT ) 60 MG capsule Take 1 capsule (60 mg total) by mouth daily. 30 capsule 11   DULoxetine  (CYMBALTA ) 60 MG capsule Take 60 mg by mouth daily.     FARXIGA  10 MG TABS tablet Take 10 mg by mouth every morning.     furosemide  (LASIX ) 20 MG tablet TAKE 1  TABLET(20 MG) BY MOUTH DAILY AS NEEDED 90 tablet 3   losartan  (COZAAR ) 100 MG tablet Take 1 tablet (100 mg total) by mouth daily. for blood pressure 90 tablet 1   magnesium  oxide (MAG-OX) 400 (240 Mg) MG tablet TAKE 1 TABLET(400 MG) BY MOUTH DAILY 90 tablet 3   omeprazole  (PRILOSEC) 20 MG capsule Take 20 mg by mouth daily.     sertraline  (ZOLOFT ) 25 MG tablet Take 1 tablet (25 mg total) by mouth daily. for anxiety and depression. 90 tablet 0   triamcinolone  cream (KENALOG ) 0.1 % Apply 1 Application topically 2 (two) times daily as needed. 80 g 3   amLODipine  (NORVASC ) 10 MG tablet Take 1 tablet (10 mg total) by mouth daily. (Patient not taking: Reported on 06/18/2024) 180 tablet 3   Apremilast  (OTEZLA ) 30 MG TABS Take 1 tablet (30 mg total) by mouth 2 (two) times daily. (Patient not taking: Reported on 06/18/2024) 60 tablet 5   No current facility-administered medications on file prior to visit.    BP 134/88   Pulse (!) 119   Temp 97.8 F (36.6 C) (Temporal)   Ht 5' 3 (1.6 m)   Wt 155 lb (70.3 kg)   SpO2 100%   BMI 27.46 kg/m  Objective:   Physical Exam Constitutional:      Appearance: She is ill-appearing.  HENT:     Right Ear: Tympanic membrane and ear canal normal.     Left Ear: Tympanic membrane and ear canal normal.     Nose: No mucosal edema.     Right Sinus: No maxillary sinus tenderness or frontal sinus tenderness.     Left Sinus: No maxillary sinus tenderness or frontal sinus tenderness.     Mouth/Throat:     Mouth: Mucous membranes are moist.  Eyes:     Conjunctiva/sclera: Conjunctivae normal.  Cardiovascular:     Rate and Rhythm: Regular rhythm. Tachycardia present.  Pulmonary:     Effort: Pulmonary effort is normal. No tachypnea or accessory muscle usage.     Breath sounds: No decreased air movement. Examination of the right-upper field reveals rhonchi. Examination of the left-upper field reveals rhonchi. Examination of the right-lower field reveals rhonchi.  Examination of  the left-lower field reveals rhonchi. Rhonchi present. No decreased breath sounds or wheezing.  Musculoskeletal:     Cervical back: Neck supple.  Skin:    General: Skin is warm and dry.     Physical Exam        Assessment & Plan:  Acute cough Assessment & Plan: Concerning presentation today. Lung sounds suspicious for some form of bacterial involvement.  Start Augmentin  antibiotics. Take 1 tablet by mouth twice daily for 7 days. Start prednisone  60 mg daily x 5 days.  Continue albuterol  nebulizer treatment if needed.  Will update chest x-ray today.  Hospital precautions provided.   Orders: -     DG Chest 2 View -     Doxycycline Hyclate; Take 1 tablet (100 mg total) by mouth 2 (two) times daily.  Dispense: 14 tablet; Refill: 0 -     predniSONE ; Take 3 tablets (60 mg total) by mouth daily with breakfast for 5 days.  Dispense: 15 tablet; Refill: 0    Assessment and Plan Assessment & Plan         Comer MARLA Gaskins, NP     History of Present Illness

## 2024-06-18 NOTE — Assessment & Plan Note (Signed)
 Concerning presentation today. Lung sounds suspicious for some form of bacterial involvement.  Start Augmentin  antibiotics. Take 1 tablet by mouth twice daily for 7 days. Start prednisone  60 mg daily x 5 days.  Continue albuterol  nebulizer treatment if needed.  Will update chest x-ray today.  Hospital precautions provided.

## 2024-06-18 NOTE — Patient Instructions (Signed)
 Start Augmentin  antibiotics. Take 1 tablet by mouth twice daily for 7 days.  Start prednisone  20 mg tablets. Take 3 tablets by mouth once daily in the morning for 5 days.  Complete xray(s) prior to leaving today. I will notify you of your results once received.  It was a pleasure to see you today!

## 2024-06-19 ENCOUNTER — Ambulatory Visit: Payer: Self-pay | Admitting: Primary Care

## 2024-06-22 ENCOUNTER — Ambulatory Visit: Payer: Medicare Other | Admitting: Cardiology

## 2024-06-23 ENCOUNTER — Ambulatory Visit: Admitting: Primary Care

## 2024-06-23 ENCOUNTER — Other Ambulatory Visit: Payer: Self-pay

## 2024-06-23 DIAGNOSIS — F4323 Adjustment disorder with mixed anxiety and depressed mood: Secondary | ICD-10-CM

## 2024-06-23 MED ORDER — SERTRALINE HCL 25 MG PO TABS: 25.0000 mg | ORAL_TABLET | Freq: Every day | ORAL | 1 refills | Status: AC

## 2024-07-25 ENCOUNTER — Other Ambulatory Visit: Payer: Self-pay | Admitting: Gastroenterology

## 2024-08-20 ENCOUNTER — Encounter: Payer: Self-pay | Admitting: Primary Care

## 2024-08-20 ENCOUNTER — Ambulatory Visit: Admitting: Primary Care

## 2024-08-20 VITALS — BP 134/70 | HR 56 | Temp 98.0°F | Ht 63.0 in | Wt 160.2 lb

## 2024-08-20 DIAGNOSIS — G5603 Carpal tunnel syndrome, bilateral upper limbs: Secondary | ICD-10-CM | POA: Diagnosis not present

## 2024-08-20 MED ORDER — KETOROLAC TROMETHAMINE 60 MG/2ML IM SOLN
60.0000 mg | Freq: Once | INTRAMUSCULAR | Status: AC
  Administered 2024-08-20: 13:00:00 60 mg via INTRAMUSCULAR

## 2024-08-20 MED ORDER — PREDNISONE 20 MG PO TABS: ORAL_TABLET | ORAL | 0 refills | Status: DC

## 2024-08-20 NOTE — Assessment & Plan Note (Signed)
 Symptoms and presentation today consistent with carpal tunnel syndrome. We discussed that we can send her for formal testing, she kindly declines at this point  IM Toradol  60 mg provided in the office today. Start prednisone  20 mg tablets tomorrow. Take 3 tablets by mouth every morning for 3 days, then 2 tablets for 3 days, then 1 tablet for 3 days.  We also discussed to schedule an appointment with our sports medicine physician for further evaluation, especially if symptoms persist.

## 2024-08-20 NOTE — Progress Notes (Signed)
 Subjective:    Patient ID: Amy Underwood, female    DOB: 1946-05-22, 78 y.o.   MRN: [id number]  Amy Underwood is a very pleasant 78 y.o. female with a history of hypertension, paroxysmal atrial fibrillation, pneumonia, bilateral carpal tunnel syndrome, CKD, gout, gastric bypass who presents today to discuss paresthesias and pain.  Symptoms onset approximately 2 weeks ago with left upper extremity paresthesias and pain to the left hand with radiation to left mid forearm. A few days ago she began noticing paresthesias without pain to the right hand. More recently her symptoms to the left hand have increased. It hurts her left hand to grasp objects, twist lids, holding a steering wheel in a car without pain. Her pain will wake her from sleep.   She's applied a hand protector at night to the left hand which has helped. She's been taking Tylenol  with little improvement. She is active during the day with writing and housecleaning.   She was diagnosed with bilateral carpal tunnel syndrome years ago, never had surgical intervention.  Review of Systems  Musculoskeletal:  Positive for arthralgias.  Neurological:  Positive for weakness and numbness.         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 Cindie   Psoriasis    Unintentional weight loss 04/28/2023    Social History   Socioeconomic History   Marital status: Widowed    Spouse name: Not on file   Number of  children: 2   Years of education: Not on file   Highest education level: GED or equivalent  Occupational History   Occupation: retired  Tobacco Use   Smoking status: Former    Current packs/day: 2.00    Average packs/day: 2.0 packs/day for 26.4 years (52.7 ttl pk-yrs)    Types: Cigarettes    Start date: 2001   Smokeless tobacco: Never  Vaping Use   Vaping status: Never Used  Substance and Sexual Activity   Alcohol use: Never   Drug use: Never   Sexual activity: Not Currently  Other Topics Concern   Not on file  Social History Narrative   Not on file   Social Drivers of Health   Tobacco Use: Medium Risk (08/20/2024)   Patient History    Smoking Tobacco Use: Former    Smokeless Tobacco Use: Never    Passive Exposure: Not on Actuary Strain: Low Risk (02/15/2024)   Overall Financial Resource Strain (CARDIA)    Difficulty of Paying Living Expenses: Not hard at all  Food Insecurity: No Food Insecurity (02/15/2024)   Epic    Worried About Programme Researcher, Broadcasting/film/video in the Last Year: Never true    Ran Out of Food in the Last Year: Never true  Transportation Needs: No Transportation Needs (02/15/2024)   Epic    Lack of Transportation (Medical): No    Lack of Transportation (Non-Medical): No  Physical Activity: Insufficiently Active (02/15/2024)  Exercise Vital Sign    Days of Exercise per Week: 1 day    Minutes of Exercise per Session: 30 min  Stress: Stress Concern Present (02/15/2024)   [employer] of Occupational Health - Occupational Stress Questionnaire    Feeling of Stress: To some extent  Social Connections: Moderately Isolated (02/15/2024)   Social Connection and Isolation Panel    Frequency of Communication with Friends and Family: Three times a week    Frequency of Social Gatherings with Friends and Family: More than three times a week    Attends Religious Services: 1 to 4 times per year    Active Member of [employer] or Organizations: No    Attends Occupational Hygienist Meetings: Not on file    Marital Status: Widowed  Intimate Partner Violence: Not At Risk (05/02/2023)   Humiliation, Afraid, Rape, and Kick questionnaire    Fear of Current or Ex-Partner: No    Emotionally Abused: No    Physically Abused: No    Sexually Abused: No  Depression (PHQ2-9): Low Risk (08/20/2024)   Depression (PHQ2-9)    PHQ-2 Score: 0  Alcohol Screen: Low Risk (04/30/2023)   Alcohol Screen    Last Alcohol Screening Score (AUDIT): 0  Housing: Low Risk (02/15/2024)   Epic    Unable to Pay for Housing in the Last Year: No    Number of Times Moved in the Last Year: 0    Homeless in the Last Year: No  Utilities: Not At Risk (04/30/2023)   AHC Utilities    Threatened with loss of utilities: No  Health Literacy: Adequate Health Literacy (05/02/2023)   B1300 Health Literacy    Frequency of need for help with medical instructions: Never    Past Surgical History:  Procedure Laterality Date   ATRIAL FIBRILLATION ABLATION N/A 01/03/2022   Procedure: ATRIAL FIBRILLATION ABLATION;  Surgeon: Cindie Ole DASEN, MD;  Location: [facility];  Service: Cardiovascular;  Laterality: N/A;   CARDIOVERSION N/A 03/17/2024   Procedure: CARDIOVERSION;  Surgeon: Darliss Rogue, MD;  Location: [facility];  Service: Cardiovascular;  Laterality: N/A;   CARPAL TUNNEL RELEASE Right 01/18/2023   Procedure: CARPAL TUNNEL RELEASE;  Surgeon: Dozier Soulier, MD;  Location: [facility];  Service: Orthopedics;  Laterality: Right;   CESAREAN SECTION     3   CHOLECYSTECTOMY     ESOPHAGOGASTRODUODENOSCOPY N/A 01/16/2024   Procedure: EGD (ESOPHAGOGASTRODUODENOSCOPY);  Surgeon: Therisa Bi, MD;  Location: [facility] ENDOSCOPY;  Service: Gastroenterology;  Laterality: N/A;   HIP ARTHROPLASTY Left    KNEE ARTHROPLASTY Bilateral    LEFT ATRIAL APPENDAGE OCCLUSION N/A 04/20/2022   Procedure: LEFT ATRIAL APPENDAGE OCCLUSION;  Surgeon: Cindie Ole DASEN, MD;  Location: [facility];  Service:  Cardiovascular;  Laterality: N/A;   PERIPHERAL VASCULAR INTERVENTION  07/18/2023   Procedure: PERIPHERAL VASCULAR INTERVENTION;  Surgeon: Darron Deatrice LABOR, MD;  Location: [facility];  Service: Cardiovascular;;   REVERSE SHOULDER ARTHROPLASTY Right 01/18/2023   Procedure: REVERSE SHOULDER ARTHROPLASTY;  Surgeon: Dozier Soulier, MD;  Location: [facility];  Service: Orthopedics;  Laterality: Right;   TEE WITHOUT CARDIOVERSION N/A 04/20/2022   Procedure: TRANSESOPHAGEAL ECHOCARDIOGRAM (TEE);  Surgeon: Cindie Ole DASEN, MD;  Location: [facility] INVASIVE CV LAB;  Service: Cardiovascular;  Laterality: N/A;   VISCERAL ANGIOGRAPHY N/A 07/18/2023   Procedure: MESENTRIC  ANGIOGRAPHY;  Surgeon: Darron Deatrice LABOR, MD;  Location: [facility];  Service: Cardiovascular;  Laterality: N/A;    Family History  Problem Relation Age  of Onset   Heart disease Mother    Breast cancer Neg Hx    Colon cancer Neg Hx    Esophageal cancer Neg Hx    Inflammatory bowel disease Neg Hx    Liver disease Neg Hx    Pancreatic cancer Neg Hx    Rectal cancer Neg Hx    Stomach cancer Neg Hx     Allergies[1]  Medications Ordered Prior to Encounter[2]  BP 134/70   Pulse (!) 56   Temp 98 F (36.7 C) (Oral)   Ht 5' 3 (1.6 m)   Wt 160 lb 4 oz (72.7 kg)   SpO2 98%   BMI 28.39 kg/m  Objective:   Physical Exam Cardiovascular:     Rate and Rhythm: Normal rate and regular rhythm.  Pulmonary:     Effort: Pulmonary effort is normal.     Breath sounds: Normal breath sounds.  Musculoskeletal:     Right hand: Decreased range of motion.     Left hand: Swelling present. Decreased range of motion.     Cervical back: Neck supple.  Skin:    General: Skin is warm and dry.  Neurological:     Mental Status: She is alert and oriented to person, place, and time.     Comments: Negative Phalen and Tinel signs  Psychiatric:        Mood and Affect: Mood normal.     Physical Exam        Assessment & Plan:   Bilateral carpal tunnel syndrome Assessment & Plan: Symptoms and presentation today consistent with carpal tunnel syndrome. We discussed that we can send her for formal testing, she kindly declines at this point  IM Toradol  60 mg provided in the office today. Start prednisone  20 mg tablets tomorrow. Take 3 tablets by mouth every morning for 3 days, then 2 tablets for 3 days, then 1 tablet for 3 days.  We also discussed to schedule an appointment with our sports medicine physician for further evaluation, especially if symptoms persist.   Orders: -     predniSONE ; Take 3 tablets by mouth daily x 3 days, then 2 tablets x 3 days, then 1 tablet for 3 days.  Dispense: 18 tablet; Refill: 0 -     Ketorolac  Tromethamine     Assessment and Plan Assessment & Plan         Comer MARLA Gaskins, NP       [1]  Allergies Allergen Reactions   Alendronate  Palpitations   Cefaclor Hives and Rash  [2]  Current Outpatient Medications on File Prior to Visit  Medication Sig Dispense Refill   albuterol  (PROVENTIL ) (2.5 MG/3ML) 0.083% nebulizer solution Take 3 mLs (2.5 mg total) by nebulization every 6 (six) hours as needed for wheezing or shortness of breath. 150 mL 0   allopurinol  (ZYLOPRIM ) 100 MG tablet Take 100 mg by mouth at bedtime.     amiodarone  (PACERONE ) 100 MG tablet Take 1 tablet (100 mg total) by mouth daily. 90 tablet 1   amLODipine  (NORVASC ) 5 MG tablet Take 5 mg by mouth daily.     aspirin  EC 81 MG tablet Take 1 tablet (81 mg total) by mouth daily. Swallow whole.     atorvastatin  (LIPITOR) 20 MG tablet Take 1 tablet (20 mg total) by mouth daily. for cholesterol. 90 tablet 1   Calcium -Magnesium -Vitamin D (CALCIUM  1200+D3 PO) Take 1 tablet by mouth daily.     Cholecalciferol  (DIALYVITE VITAMIN D 5000) 125 MCG (5000 UT) capsule Take  5,000 Units by mouth daily.     clopidogrel  (PLAVIX ) 75 MG tablet      Cyanocobalamin  5000 MCG TBDP Take 5,000 mcg by mouth daily. Vit B12      dexlansoprazole  (DEXILANT ) 60 MG capsule Take 1 capsule (60 mg total) by mouth daily. 30 capsule 11   doxycycline  (VIBRA -TABS) 100 MG tablet Take 1 tablet (100 mg total) by mouth 2 (two) times daily. 14 tablet 0   DULoxetine  (CYMBALTA ) 60 MG capsule Take 60 mg by mouth daily.     FARXIGA  10 MG TABS tablet Take 10 mg by mouth every morning.     furosemide  (LASIX ) 20 MG tablet TAKE 1 TABLET(20 MG) BY MOUTH DAILY AS NEEDED 90 tablet 3   losartan  (COZAAR ) 100 MG tablet Take 1 tablet (100 mg total) by mouth daily. for blood pressure 90 tablet 1   magnesium  oxide (MAG-OX) 400 (240 Mg) MG tablet TAKE 1 TABLET(400 MG) BY MOUTH DAILY 90 tablet 3   omeprazole  (PRILOSEC) 20 MG capsule Take 20 mg by mouth daily.     sertraline  (ZOLOFT ) 25 MG tablet Take 1 tablet (25 mg total) by mouth daily. for anxiety and depression. 90 tablet 1   triamcinolone  cream (KENALOG ) 0.1 % Apply 1 Application topically 2 (two) times daily as needed. 80 g 3   No current facility-administered medications on file prior to visit.

## 2024-08-20 NOTE — Patient Instructions (Signed)
 Start prednisone  20 mg tablets tomorrow. Take 3 tablets by mouth every morning for 3 days, then 2 tablets for 3 days, then 1 tablet for 3 days.  Schedule an appointment with Dr. Watt for 2 to 3 weeks from now.  Schedule your physical with me in early May 2026.  It was a pleasure to see you today!

## 2024-09-07 NOTE — Progress Notes (Unsigned)
 "    Arian Murley T. Alwyn Cordner, MD, CAQ [facility] at [facility] [address] [place], [address]  Phone: [phone]  FAX: [phone]  Amy Underwood - 79 y.o. female  MRN [id number]  Date of Birth: 12/05/1945  Date: 09/08/2024  PCP: Gretta Comer POUR, NP  Referral: Gretta Comer POUR, NP  No chief complaint on file.  Subjective:   Amy Underwood is a 79 y.o. very pleasant female patient with There is no height or weight on file to calculate BMI. who presents with the following:  Discussed the use of AI scribe software for clinical note transcription with the patient, who gave verbal consent to proceed.  This is a complicated medical patient with history of chronic kidney disease stage IV, A-fib, chronic anticoagulation, history of gastric bypass who presents for evaluation of ongoing hand pain.  She was seen by her primary care doctor on August 20, 2024, for bilateral carpal tunnel syndrome. History of Present Illness     Review of Systems is noted in the HPI, as appropriate  Objective:   There were no vitals taken for this visit.  GEN: No acute distress; alert,appropriate. PULM: Breathing comfortably in no respiratory distress PSYCH: Normally interactive.   Laboratory and Imaging Data:  Assessment and Plan:   No diagnosis found. Assessment & Plan   Medication Management during today's office visit: No orders of the defined types were placed in this encounter.  There are no discontinued medications.  Orders placed today for conditions managed today: No orders of the defined types were placed in this encounter.   Disposition: No follow-ups on file.  [employer] One speech-to-text software was used for transcription in this dictation.  Possible transcriptional errors can occur using Animal nutritionist.   Signed,  Jacques DASEN. Andera Cranmer, MD   Outpatient Encounter Medications as of 09/08/2024  Medication Sig   albuterol   (PROVENTIL ) (2.5 MG/3ML) 0.083% nebulizer solution Take 3 mLs (2.5 mg total) by nebulization every 6 (six) hours as needed for wheezing or shortness of breath.   allopurinol  (ZYLOPRIM ) 100 MG tablet Take 100 mg by mouth at bedtime.   amiodarone  (PACERONE ) 100 MG tablet Take 1 tablet (100 mg total) by mouth daily.   amLODipine  (NORVASC ) 5 MG tablet Take 5 mg by mouth daily.   aspirin  EC 81 MG tablet Take 1 tablet (81 mg total) by mouth daily. Swallow whole.   atorvastatin  (LIPITOR) 20 MG tablet Take 1 tablet (20 mg total) by mouth daily. for cholesterol.   Calcium -Magnesium -Vitamin D (CALCIUM  1200+D3 PO) Take 1 tablet by mouth daily.   Cholecalciferol  (DIALYVITE VITAMIN D 5000) 125 MCG (5000 UT) capsule Take 5,000 Units by mouth daily.   clopidogrel  (PLAVIX ) 75 MG tablet    Cyanocobalamin  5000 MCG TBDP Take 5,000 mcg by mouth daily. Vit B12   dexlansoprazole  (DEXILANT ) 60 MG capsule Take 1 capsule (60 mg total) by mouth daily.   doxycycline  (VIBRA -TABS) 100 MG tablet Take 1 tablet (100 mg total) by mouth 2 (two) times daily.   DULoxetine  (CYMBALTA ) 60 MG capsule Take 60 mg by mouth daily.   FARXIGA  10 MG TABS tablet Take 10 mg by mouth every morning.   furosemide  (LASIX ) 20 MG tablet TAKE 1 TABLET(20 MG) BY MOUTH DAILY AS NEEDED   losartan  (COZAAR ) 100 MG tablet Take 1 tablet (100 mg total) by mouth daily. for blood pressure   magnesium  oxide (MAG-OX) 400 (240 Mg) MG tablet TAKE 1 TABLET(400 MG) BY MOUTH DAILY  omeprazole  (PRILOSEC) 20 MG capsule Take 20 mg by mouth daily.   predniSONE  (DELTASONE ) 20 MG tablet Take 3 tablets by mouth daily x 3 days, then 2 tablets x 3 days, then 1 tablet for 3 days.   sertraline  (ZOLOFT ) 25 MG tablet Take 1 tablet (25 mg total) by mouth daily. for anxiety and depression.   triamcinolone  cream (KENALOG ) 0.1 % Apply 1 Application topically 2 (two) times daily as needed.   No facility-administered encounter medications on file as of 09/08/2024.   "

## 2024-09-08 ENCOUNTER — Encounter: Payer: Self-pay | Admitting: Family Medicine

## 2024-09-08 ENCOUNTER — Ambulatory Visit: Admitting: Family Medicine

## 2024-09-08 VITALS — BP 180/68 | HR 54 | Temp 98.3°F | Ht 63.0 in | Wt 161.2 lb

## 2024-09-08 DIAGNOSIS — G5602 Carpal tunnel syndrome, left upper limb: Secondary | ICD-10-CM

## 2024-09-08 DIAGNOSIS — G5603 Carpal tunnel syndrome, bilateral upper limbs: Secondary | ICD-10-CM

## 2024-09-08 MED ORDER — TRIAMCINOLONE ACETONIDE 40 MG/ML IJ SUSP
20.0000 mg | Freq: Once | INTRAMUSCULAR | Status: AC
  Administered 2024-09-08: 20 mg via INTRA_ARTICULAR

## 2024-09-08 NOTE — Addendum Note (Signed)
 Addended by: WENDELL ARLAND RAMAN on: 09/08/2024 09:45 AM   Modules accepted: Orders

## 2024-09-18 ENCOUNTER — Ambulatory Visit: Admitting: Dermatology

## 2024-09-30 ENCOUNTER — Encounter: Payer: Self-pay | Admitting: Dermatology

## 2024-09-30 ENCOUNTER — Ambulatory Visit (INDEPENDENT_AMBULATORY_CARE_PROVIDER_SITE_OTHER): Admitting: Dermatology

## 2024-09-30 DIAGNOSIS — Z79899 Other long term (current) drug therapy: Secondary | ICD-10-CM

## 2024-09-30 DIAGNOSIS — W908XXA Exposure to other nonionizing radiation, initial encounter: Secondary | ICD-10-CM | POA: Diagnosis not present

## 2024-09-30 DIAGNOSIS — L409 Psoriasis, unspecified: Secondary | ICD-10-CM | POA: Diagnosis not present

## 2024-09-30 DIAGNOSIS — Z7189 Other specified counseling: Secondary | ICD-10-CM | POA: Diagnosis not present

## 2024-09-30 DIAGNOSIS — L578 Other skin changes due to chronic exposure to nonionizing radiation: Secondary | ICD-10-CM

## 2024-09-30 NOTE — Patient Instructions (Signed)

## 2024-09-30 NOTE — Progress Notes (Signed)
 "  Follow-Up Visit   Subjective  Amy Underwood is a 79 y.o. female who presents for the following:  follow up on psoriasis has been otezla  , has a few flares at hands today, reports has been unable to get script for otezla  and has been out of medication for 2 months. While on medication she reports she did have some gi issues and depression.  Hx of isks, hx of epidermal cyst at right upper back was surgically removed but reports  Reports burns and itches    The following portions of the chart were reviewed this encounter and updated as appropriate: medications, allergies, medical history  Review of Systems:  No other skin or systemic complaints except as noted in HPI or Assessment and Plan.  Objective  Well appearing patient in no apparent distress; mood and affect are within normal limits.   A focused examination was performed of the following areas: Hand, back   Relevant exam findings are noted in the Assessment and Plan.    Assessment & Plan   PSORIASIS Psoriasis is flared due to patient being off medication for about a 2 month not able to afford medication and was having GI side effects and depression side effects - now resolved after not being on for 2 mos. Was diagnosed with Psoriasis in [facility] [place] about 8 years ago  and treated with Enbrel inj.  2023 previous BSA was 15 % without systemic treatment   Chronic and persistent condition with duration or expected duration over one year. Condition is bothersome/symptomatic for patient. Currently flared.   Patient reports she has had GI Symptoms and depression . Has been out of medication for 2 months due to insurance coverage.   Exam: Well-demarcated erythematous papules/plaques with silvery scale, guttate pink scaly papules.  6% BSA today after finishing a round of prednisone  for upper respiratory infection.  BSA in 202315 %  Today BSA 6 % after recent oral prednisone  for Lung problem Has tried and failed Enbrel  Failed  Otezla  due to side effects with Depression  Treatment Plan:  Discussed Humira and Tremfya  Will consider Tremfya Pending Labs Pending Insurance   D/c otezla    Continue Triamcinolone  cream qd-bid prn  Counseling on psoriasis and coordination of care  psoriasis is a chronic non-curable, but treatable genetic/hereditary disease that may have other systemic features affecting other organ systems such as joints (Psoriatic Arthritis). It is associated with an increased risk of inflammatory bowel disease, heart disease, non-alcoholic fatty liver disease, and depression.  Treatments include light and laser treatments; topical medications; and systemic medications including oral and injectables.  ACTINIC DAMAGE - chronic, secondary to cumulative UV radiation exposure/sun exposure over time - diffuse scaly erythematous macules with underlying dyspigmentation - Recommend daily broad spectrum sunscreen SPF 30+ to sun-exposed areas, reapply every 2 hours as needed.  - Recommend staying in the shade or wearing long sleeves, sun glasses (UVA+UVB protection) and wide brim hats (4-inch brim around the entire circumference of the hat). - Call for new or changing lesions.  LONG-TERM USE OF HIGH-RISK MEDICATION   This Visit - Comprehensive metabolic panel with GFR - CBC with Differential/Platelet - Hepatitis B surface antibody,qualitative - Hepatitis B surface antigen - Hepatitis C antibody - HIV Antibody (routine testing w rflx) - QuantiFERON-TB Gold Plus  Return for 4 month psorias.  Amy Underwood, CMA, am acting as scribe for Alm Rhyme, MD.   Documentation: I have reviewed the above documentation for accuracy and completeness, and I  agree with the above.  Alm Rhyme, MD    "

## 2024-10-06 LAB — HEPATITIS B SURFACE ANTIBODY,QUALITATIVE: Hep B Surface Ab, Qual: NONREACTIVE

## 2024-10-06 LAB — CBC WITH DIFFERENTIAL/PLATELET
Basophils Absolute: 0 10*3/uL (ref 0.0–0.2)
Basos: 0 %
EOS (ABSOLUTE): 0.1 10*3/uL (ref 0.0–0.4)
Eos: 3 %
Hematocrit: 35.1 % (ref 34.0–46.6)
Hemoglobin: 10.6 g/dL — ABNORMAL LOW (ref 11.1–15.9)
Immature Grans (Abs): 0 10*3/uL (ref 0.0–0.1)
Immature Granulocytes: 0 %
Lymphocytes Absolute: 1 10*3/uL (ref 0.7–3.1)
Lymphs: 19 %
MCH: 25.9 pg — ABNORMAL LOW (ref 26.6–33.0)
MCHC: 30.2 g/dL — ABNORMAL LOW (ref 31.5–35.7)
MCV: 86 fL (ref 79–97)
Monocytes Absolute: 0.5 10*3/uL (ref 0.1–0.9)
Monocytes: 10 %
Neutrophils Absolute: 3.5 10*3/uL (ref 1.4–7.0)
Neutrophils: 68 %
Platelets: 239 10*3/uL (ref 150–450)
RBC: 4.09 x10E6/uL (ref 3.77–5.28)
RDW: 16.4 % — ABNORMAL HIGH (ref 11.7–15.4)
WBC: 5.1 10*3/uL (ref 3.4–10.8)

## 2024-10-06 LAB — COMPREHENSIVE METABOLIC PANEL WITH GFR
ALT: 15 [IU]/L (ref 0–32)
AST: 20 [IU]/L (ref 0–40)
Albumin: 4.1 g/dL (ref 3.8–4.8)
Alkaline Phosphatase: 121 [IU]/L (ref 49–135)
BUN/Creatinine Ratio: 19 (ref 12–28)
BUN: 30 mg/dL — ABNORMAL HIGH (ref 8–27)
Bilirubin Total: 0.4 mg/dL (ref 0.0–1.2)
CO2: 20 mmol/L (ref 20–29)
Calcium: 9.4 mg/dL (ref 8.7–10.3)
Chloride: 105 mmol/L (ref 96–106)
Creatinine, Ser: 1.55 mg/dL — ABNORMAL HIGH (ref 0.57–1.00)
Globulin, Total: 2.1 g/dL (ref 1.5–4.5)
Glucose: 94 mg/dL (ref 70–99)
Potassium: 5.1 mmol/L (ref 3.5–5.2)
Sodium: 144 mmol/L (ref 134–144)
Total Protein: 6.2 g/dL (ref 6.0–8.5)
eGFR: 34 mL/min/{1.73_m2} — ABNORMAL LOW

## 2024-10-06 LAB — QUANTIFERON-TB GOLD PLUS
QuantiFERON Mitogen Value: >10 [IU]/mL
QuantiFERON Nil Value: 0.05 [IU]/mL
QuantiFERON TB1 Ag Value: 0.05 [IU]/mL
QuantiFERON TB2 Ag Value: 0.06 [IU]/mL
QuantiFERON-TB Gold Plus: NEGATIVE

## 2024-10-06 LAB — HIV ANTIBODY (ROUTINE TESTING W REFLEX): HIV Screen 4th Generation wRfx: NONREACTIVE

## 2024-10-06 LAB — HEPATITIS C ANTIBODY: Hep C Virus Ab: NONREACTIVE

## 2024-10-06 LAB — HEPATITIS B SURFACE ANTIGEN: Hepatitis B Surface Ag: NEGATIVE

## 2024-10-07 ENCOUNTER — Ambulatory Visit: Payer: Self-pay | Admitting: Dermatology

## 2024-10-08 MED ORDER — TREMFYA 100 MG/ML ~~LOC~~ SOSY: 100.0000 mg | PREFILLED_SYRINGE | SUBCUTANEOUS | 0 refills | Status: AC

## 2024-10-08 MED ORDER — TREMFYA 100 MG/ML ~~LOC~~ SOSY: 100.0000 mg | PREFILLED_SYRINGE | SUBCUTANEOUS | 3 refills | Status: AC

## 2024-10-08 NOTE — Telephone Encounter (Signed)
-----   Message from Woodward Brought, MD sent at 10/08/2024  8:30 AM EST ----- Regarding: RE: Guselkumab  does not need renal dosing. Safe from my end.   SCK ----- Message ----- From: Pamila Grayce SAUNDERS, CMA Sent: 10/07/2024   3:34 PM EST To: Woodward Brought, MD; Alm JAYSON Rhyme, MD; Ca#  Below are lab results for [facility].  Dr. Kowalski would like to start patient on systemic treatment, Tremfya  for psoriasis.  Given her history of A-Fib and kidney disease he would like to know  if you all have any objection to patient starting Tremfya .  Thank you,    Grayce Pamila, RMA [facility] ----- Message ----- From: Rhyme Alm JAYSON, MD Sent: 10/07/2024   3:30 PM EST To: Rhyme Clinical  Result Note Lab from 10/02/2024 = CBC and CMP shows some anemia and elevated kidney labs. Pt has known kidney disease and A-Fib. Review of literature does not appear to show any contraindication for Tremfya  treatment for Psoriasis in pt with kidney disease or anemia or A-Fib. Will have Cardiology and Nephrology comment on whether they feel pt can take Tremfya .   HIV negative/ Normal Hepatitis B and C tests normal TB test = Quantiferon Gold = Negative/ Normal   Please send note to Cardiologis and Nephrologist concerning Tremfya  treatment. If they feel no contraindication, will start systemic Tremfya  shots - may order

## 2024-10-08 NOTE — Telephone Encounter (Signed)
 Left pt msg to call for lab results.  Need to advise pt of lab results and that Dr. Hester reached out to her Nephrologist Dr. Woodward and to her cardiologist Dr. Cindie to make sure ok to start Tremfya  with hx of A-Fib and kidney disease.  We have heard from Dr. Woodward that is is ok to start Tremfya  and are awaiting on Dr. Ramona response.  Dr. Hester advised to send in Tremfya  while we await Dr. Ramona response.  Tremfya  loading and maintenance dosing sent to [facility].  Below are Dr. Lionell comments on labs:  Lab from 10/02/2024 = CBC and CMP shows some anemia and elevated kidney labs. Pt has known kidney disease and A-Fib. Review of literature does not appear to show any contraindication for Tremfya  treatment for Psoriasis in pt with kidney disease or anemia or A-Fib. Will have Cardiology and Nephrology comment on whether they feel pt can take Tremfya .   HIV negative/ Normal Hepatitis B and C tests normal TB test = Quantiferon Gold = Negative/ Normal   Please send note to Cardiologis and Nephrologist concerning Tremfya  treatment. If they feel no contraindication, will start systemic Tremfya  shots - may order /sh

## 2024-10-09 NOTE — Telephone Encounter (Signed)
 Patient has returned call and advised of all information per Dr. Hester. aw

## 2025-01-07 ENCOUNTER — Encounter: Admitting: Primary Care

## 2025-02-05 ENCOUNTER — Ambulatory Visit: Admitting: Dermatology
